# Patient Record
Sex: Female | Born: 1945 | Race: Black or African American | Hispanic: No | Smoking: Never smoker
Health system: Southern US, Community
[De-identification: ages and names within clinical notes are randomized; demographics above are authoritative.]

## PROBLEM LIST (undated history)

## (undated) DIAGNOSIS — Z87442 Personal history of urinary calculi: Secondary | ICD-10-CM

## (undated) DIAGNOSIS — Z9221 Personal history of antineoplastic chemotherapy: Secondary | ICD-10-CM

## (undated) DIAGNOSIS — I10 Essential (primary) hypertension: Secondary | ICD-10-CM

## (undated) DIAGNOSIS — Z923 Personal history of irradiation: Secondary | ICD-10-CM

## (undated) DIAGNOSIS — M1990 Unspecified osteoarthritis, unspecified site: Secondary | ICD-10-CM

## (undated) HISTORY — PX: MASTECTOMY: SHX3

## (undated) HISTORY — PX: CYSTOSCOPY: SUR368

## (undated) HISTORY — PX: BREAST BIOPSY: SHX20

---

## 2015-10-05 ENCOUNTER — Emergency Department (HOSPITAL_COMMUNITY)
Admission: EM | Admit: 2015-10-05 | Discharge: 2015-10-05 | Disposition: A | Discharge status: Home / Self Care | Payer: Medicare Other | Attending: Emergency Medicine | Admitting: Emergency Medicine

## 2015-10-05 ENCOUNTER — Emergency Department (HOSPITAL_COMMUNITY): Payer: Medicare Other

## 2015-10-05 ENCOUNTER — Encounter (HOSPITAL_COMMUNITY): Payer: Self-pay | Admitting: *Deleted

## 2015-10-05 DIAGNOSIS — I10 Essential (primary) hypertension: Secondary | ICD-10-CM | POA: Diagnosis not present

## 2015-10-05 DIAGNOSIS — Z79899 Other long term (current) drug therapy: Secondary | ICD-10-CM | POA: Insufficient documentation

## 2015-10-05 DIAGNOSIS — T464X5A Adverse effect of angiotensin-converting-enzyme inhibitors, initial encounter: Secondary | ICD-10-CM | POA: Diagnosis not present

## 2015-10-05 DIAGNOSIS — M170 Bilateral primary osteoarthritis of knee: Secondary | ICD-10-CM | POA: Diagnosis not present

## 2015-10-05 DIAGNOSIS — T783XXA Angioneurotic edema, initial encounter: Secondary | ICD-10-CM | POA: Diagnosis not present

## 2015-10-05 HISTORY — DX: Essential (primary) hypertension: I10

## 2015-10-05 MED ORDER — PREDNISONE 20 MG PO TABS
60.0000 mg | ORAL_TABLET | Freq: Once | ORAL | Status: AC
  Administered 2015-10-05: 60 mg via ORAL
  Filled 2015-10-05: qty 3

## 2015-10-05 MED ORDER — LOSARTAN POTASSIUM 25 MG PO TABS: 25.0000 mg | ORAL_TABLET | Freq: Every day | ORAL | Status: DC

## 2015-10-05 MED ORDER — PREDNISONE 20 MG PO TABS: 40.0000 mg | ORAL_TABLET | Freq: Every day | ORAL | Status: DC

## 2015-10-05 MED ORDER — DIPHENHYDRAMINE HCL 25 MG PO CAPS
50.0000 mg | ORAL_CAPSULE | Freq: Once | ORAL | Status: AC
  Administered 2015-10-05: 50 mg via ORAL
  Filled 2015-10-05: qty 2

## 2015-10-05 NOTE — ED Provider Notes (Signed)
CSN: [id number]     Arrival date & time 10/05/15  1132 History   First MD Initiated Contact with Patient 10/05/15 1327     Chief Complaint  Patient presents with  . Angioedema     (Consider location/radiation/quality/duration/timing/severity/associated sxs/prior Treatment) HPI   Donna [name] is a 70 y.o. female, with a history of hypertension, presenting to the ED with lip swelling that began last night. Pt states she was rinsing her mouth out with hydrogen peroxide, she dried her mouth, and then her lips began to swell. Patient also endorses that she has been taking benazepril for the last few months. Patient took 50 mg of Benadryl last night and another dose of 50 mg this morning, with no decrease in swelling. Patient denies shortness of breath, tongue swelling, hives, increase in lip swelling, or any other complaints. Pt is accompanied by her daughter. PCP is Chrisandra Netters, PA-C at the [facility].  Patient also states that she has had difficulty walking due to something going on with her knees. Patient states that her left knee bows outward and her right knee is swollen. These issues have been going on for a couple years, but they have never been evaluated. Patient's daughter states that the patient does not like doctors or hospitals and has refused to get her knees evaluated. Patient finally admits that she would like if she can get her knees x-rayed while she is here. Patient denies current pain or discomfort.  Past Medical History  Diagnosis Date  . Hypertension    History reviewed. No pertinent past surgical history. No family history on file. Social History  Substance Use Topics  . Smoking status: Never Smoker   . Smokeless tobacco: None  . Alcohol Use: No   OB History    No data available     Review of Systems  HENT: Positive for facial swelling. Negative for mouth sores.   Respiratory: Negative for shortness of breath.   Gastrointestinal: Negative for  nausea and vomiting.  Skin: Negative for rash.  Neurological: Negative for dizziness, speech difficulty and light-headedness.  All other systems reviewed and are negative.     Allergies  Benazepril  Home Medications   Prior to Admission medications   Medication Sig Start Date End Date Taking? Authorizing Provider  amLODipine (NORVASC) 10 MG tablet Take 10 mg by mouth daily. 07/29/15  Yes Historical Provider, MD  Aspirin-Salicylamide-Caffeine (BC HEADACHE POWDER PO) Take 1 packet by mouth daily as needed (pain).   Yes Historical Provider, MD  chlorthalidone (HYGROTON) 25 MG tablet Take 25 mg by mouth daily. 07/29/15  Yes Historical Provider, MD  Cholecalciferol (VITAMIN D PO) Take 1 tablet by mouth daily.   Yes Historical Provider, MD  losartan (COZAAR) 25 MG tablet Take 1 tablet (25 mg total) by mouth daily. 10/05/15   [name] C [name], PA-C  predniSONE (DELTASONE) 20 MG tablet Take 2 tablets (40 mg total) by mouth daily. 10/05/15   Toua Stites C Chardai Gangemi, PA-C   BP 121/81 mmHg  Pulse 89  Temp(Src) 98.7 F (37.1 C) (Oral)  Resp 16  Ht 5\' 5"  (1.651 m)  Wt 75.342 kg  BMI 27.64 kg/m2  SpO2 97% Physical Exam  Constitutional: She appears well-developed and well-nourished. No distress.  HENT:  Head: Normocephalic and atraumatic.  Angioedema noted to the upper and lower lips. No tongue swelling. No mouth lesions. Patient can open her mouth to at least 3 finger widths. Patient readily handles oral secretions without difficulty.  Eyes: Conjunctivae are normal. Pupils are equal, round, and reactive to light.  Neck: Neck supple.  No throat or neck swelling.  Cardiovascular: Normal rate, regular rhythm and intact distal pulses.   Pulmonary/Chest: Effort normal and breath sounds normal. No respiratory distress.  Abdominal: Soft. There is no tenderness. There is no guarding.  Musculoskeletal: She exhibits no edema or tenderness.  Some swelling noted to the right knee. No effusion, erythema, increased heat,  laxity, crepitus, or any other abnormalities. Left knee: Appears normal without any swelling, erythema, laxity, or other abnormalities. However, when the patient stands up it is evident that the knee bows laterally. Full range of motion intact in both knees.  Neurological: She is alert. She has normal reflexes.  No sensory deficits in the lower extremities. Strength 5 out of 5 bilaterally.  Skin: Skin is warm and dry. She is not diaphoretic.  No urticaria noted.  Psychiatric: She has a normal mood and affect. Her behavior is normal.  Nursing note and vitals reviewed.   ED Course  Procedures (including critical care time)  Imaging Review Dg Knee Complete 4 Views Left  10/05/2015  CLINICAL DATA:  70 year old with generalized bilateral knee pain. History of arthritis. No known injury. EXAM: LEFT KNEE - COMPLETE 4+ VIEW COMPARISON:  None. FINDINGS: The bones appear mildly demineralized. There are severe tricompartmental degenerative changes with joint space loss and prominent osteophytes. At least 1 loose body posteriorly is likely. There is no evidence of acute fracture, dislocation or significant joint effusion. Mild vascular calcifications are noted. IMPRESSION: Severe tricompartmental osteoarthritis.  No acute osseous findings. Electronically Signed   By: Richardean Sale M.D.   On: 10/05/2015 14:48   Dg Knee Complete 4 Views Right  10/05/2015  CLINICAL DATA:  70 year old with generalized bilateral knee pain. History of arthritis. No known injury. EXAM: RIGHT KNEE - COMPLETE 4+ VIEW COMPARISON:  None. FINDINGS: The bones appear demineralized. There is moderate to severe tricompartmental osteoarthritis (less than that demonstrated in the left knee) with joint space loss and osteophytes. No evidence of acute fracture, dislocation or significant joint effusion. No definite loose bodies are seen. There are mild vascular calcifications. IMPRESSION: Moderate to severe tricompartmental osteoarthritis. No  acute osseous findings. Electronically Signed   By: Richardean Sale M.D.   On: 10/05/2015 14:50   I have personally reviewed and evaluated these images as part of my medical decision-making.   EKG Interpretation None      MDM   Final diagnoses:  Angioedema, initial encounter  ACE inhibitor-aggravated angioedema, initial encounter  Primary osteoarthritis of both knees    Berta Minor presents with lip angioedema that began last night.  Findings and plan of care discussed with Carmin Muskrat, MD. Dr. Vanita Panda personally evaluated and examined this patient.   Suspect patient's benazepril may be the culprit for the angioedema. Patient and patient's daughter asked if I would mind contacting the patient's PCP and dialogue with her about strategies for replacing the patient's benazepril. 1:56 PM Spoke with Chrisandra Netters, PA-C, Patient's PCP [phone]). Ms. Darnell Level agrees that the patient should be removed from the ACE inhibitor. Further states that if the patient will be back in town within the week she can come to the office to be assessed and placed on a new blood pressure medication. If not, requested that the patient be placed on a low-dose ARB with follow-up in the office when she returns to town. Also states that she will call the patient and her daughter directly. Patient  states that she will be back in [place] next week, but this is subject to change. To avoid patient going without hypertensive medication coverage, patient prescribed a short course of losartan, to be started tomorrow. Patient was advised to watch out for signs of allergic reaction with this medication as well. Additionally, patient's x-rays show evidence of outer to severe osteoarthritis bilaterally. Patient was advised on techniques for further care of this issue. Patient will have to follow up with her PCP on this matter as well. Return precautions discussed. Patient and patient's daughter voiced understanding of all  of these instructions, agreed to the plan, and are comfortable with discharge. Patient specifically agrees to follow up with her PCP upon return to [place].   Filed Vitals:   10/05/15 1140 10/05/15 1444 10/05/15 1530  BP: 122/81 121/74 121/81  Pulse: 104 94 89  Temp: 98.7 F (37.1 C)    TempSrc: Oral    Resp: 18 18 16   Height: 5\' 5"  (1.651 m)    Weight: 75.342 kg    SpO2: 98% 99% 97%          Lorayne Bender, PA-C 10/05/15 [place], MD 10/07/15 1606

## 2015-10-05 NOTE — ED Notes (Signed)
Pt ate peanut butter crackers and ginger ale with no difficulty.

## 2015-10-05 NOTE — ED Notes (Signed)
Pt presents with swelling to upper and lower lips and is on benazpril.  Pt states the swelling started after rinsing mouth with hydrogen peroxide.

## 2015-10-05 NOTE — Discharge Instructions (Signed)
You have been seen today for lip swelling. Stop taking the benazepril immediately. Follow up with PCP as soon as possible next week. Call the office and Ms. Darnell Level will see you to reevaluate you and your blood pressure medications. In the mean time, you are being placed on a new blood pressure medication that is in a different drug class from the benazepril. If you are going to see your PCP within the next week, you may delay starting this medication until after she has evaluated you. If you do begin taking the Losartan, be sure to watch out for signs of allergic reaction or worsening swelling. If this occurs, stop taking the Losartan as well. Return to ED should symptoms worsen. Additionally, your x-ray showed moderate to severe arthritis in both knees. This is the likely the cause of your symptoms. Anti-inflammatory medications, such as ibuprofen or naproxen may help reduce inflammation and pain.

## 2016-05-24 ENCOUNTER — Other Ambulatory Visit (HOSPITAL_COMMUNITY): Payer: Self-pay | Admitting: *Deleted

## 2016-05-24 NOTE — Patient Instructions (Addendum)
Donna [name]  05/24/2016   Your procedure is scheduled on: 06-05-16  Report to [facility] Main  Entrance take [facility]  elevators to 3rd floor to  [place] at 705 AM.  Call this number if you have problems the morning of surgery [phone]   Remember: ONLY 1 PERSON MAY GO WITH YOU TO SHORT STAY TO GET  READY MORNING OF Donna [name].  Do not eat food or drink liquids :After Midnight.     Take these medicines the morning of surgery with A SIP OF WATER: AMLODIPINE (NORVASC)                               You may not have any metal on your body including hair pins and              piercings  Do not wear jewelry, make-up, lotions, powders or perfumes, deodorant             Do not wear nail polish.  Do not shave  48 hours prior to surgery.              Men may shave face and neck.   Do not bring valuables to the hospital. [place].  Contacts, dentures or bridgework may not be worn into surgery.  Leave suitcase in the car. After surgery it may be brought to your room.                  Please read over the following fact sheets you were given: _____________________________________________________________________             [facility] - Preparing for Surgery Before surgery, you can play an important role.  Because skin is not sterile, your skin needs to be as free of germs as possible.  You can reduce the number of germs on your skin by washing with CHG (chlorahexidine gluconate) soap before surgery.  CHG is an antiseptic cleaner which kills germs and bonds with the skin to continue killing germs even after washing. Please DO NOT use if you have an allergy to CHG or antibacterial soaps.  If your skin becomes reddened/irritated stop using the CHG and inform your nurse when you arrive at Short Stay. Do not shave (including legs and underarms) for at least 48 hours prior to the first CHG shower.  You may  shave your face/neck. Please follow these instructions carefully:  1.  Shower with CHG Soap the night before surgery and the  morning of Surgery.  2.  If you choose to wash your hair, wash your hair first as usual with your  normal  shampoo.  3.  After you shampoo, rinse your hair and body thoroughly to remove the  shampoo.                           4.  Use CHG as you would any other liquid soap.  You can apply chg directly  to the skin and wash                       Gently with a scrungie or clean washcloth.  5.  Apply the CHG Soap to  your body ONLY FROM THE NECK DOWN.   Do not use on face/ open                           Wound or open sores. Avoid contact with eyes, ears mouth and genitals (private parts).                       Wash face,  Genitals (private parts) with your normal soap.             6.  Wash thoroughly, paying special attention to the area where your surgery  will be performed.  7.  Thoroughly rinse your body with warm water from the neck down.  8.  DO NOT shower/wash with your normal soap after using and rinsing off  the CHG Soap.                9.  Pat yourself dry with a clean towel.            10.  Wear clean pajamas.            11.  Place clean sheets on your bed the night of your first shower and do not  sleep with pets. Day of Surgery : Do not apply any lotions/deodorants the morning of surgery.  Please wear clean clothes to the hospital/surgery center.  FAILURE TO FOLLOW THESE INSTRUCTIONS MAY RESULT IN THE CANCELLATION OF YOUR SURGERY PATIENT SIGNATURE_________________________________  NURSE SIGNATURE__________________________________  ________________________________________________________________________   Donna [name]  An incentive spirometer is a tool that can help keep your lungs clear and active. This tool measures how well you are filling your lungs with each breath. Taking long deep breaths may help reverse or decrease the chance of developing  breathing (pulmonary) problems (especially infection) following:  A long period of time when you are unable to move or be active. BEFORE THE PROCEDURE   If the spirometer includes an indicator to show your best effort, your nurse or respiratory therapist will set it to a desired goal.  If possible, sit up straight or lean slightly forward. Try not to slouch.  Hold the incentive spirometer in an upright position. INSTRUCTIONS FOR USE  1. Sit on the edge of your bed if possible, or sit up as far as you can in bed or on a chair. 2. Hold the incentive spirometer in an upright position. 3. Breathe out normally. 4. Place the mouthpiece in your mouth and seal your lips tightly around it. 5. Breathe in slowly and as deeply as possible, raising the piston or the ball toward the top of the column. 6. Hold your breath for 3-5 seconds or for as long as possible. Allow the piston or ball to fall to the bottom of the column. 7. Remove the mouthpiece from your mouth and breathe out normally. 8. Rest for a few seconds and repeat Steps 1 through 7 at least 10 times every 1-2 hours when you are awake. Take your time and take a few normal breaths between deep breaths. 9. The spirometer may include an indicator to show your best effort. Use the indicator as a goal to work toward during each repetition. 10. After each set of 10 deep breaths, practice coughing to be sure your lungs are clear. If you have an incision (the cut made at the time of surgery), support your incision when coughing by placing a pillow or rolled up towels firmly against  it. Once you are able to get out of bed, walk around indoors and cough well. You may stop using the incentive spirometer when instructed by your caregiver.  RISKS AND COMPLICATIONS  Take your time so you do not get dizzy or light-headed.  If you are in pain, you may need to take or ask for pain medication before doing incentive spirometry. It is harder to take a deep  breath if you are having pain. AFTER USE  Rest and breathe slowly and easily.  It can be helpful to keep track of a log of your progress. Your caregiver can provide you with a simple table to help with this. If you are using the spirometer at home, follow these instructions: [place] IF:   You are having difficultly using the spirometer.  You have trouble using the spirometer as often as instructed.  Your pain medication is not giving enough relief while using the spirometer.  You develop fever of 100.5 F (38.1 C) or higher. SEEK IMMEDIATE MEDICAL CARE IF:   You cough up bloody sputum that had not been present before.  You develop fever of 102 F (38.9 C) or greater.  You develop worsening pain at or near the incision site. MAKE SURE YOU:   Understand these instructions.  Will watch your condition.  Will get help right away if you are not doing well or get worse. Document Released: 10/08/2006 Document Revised: 08/20/2011 Document Reviewed: 12/09/2006 Exit[employer] Patient Information 2014 ExitCare, [place].   ________________________________________________________________________  WHAT IS A BLOOD TRANSFUSION? Blood Transfusion Information  A transfusion is the replacement of blood or some of its parts. Blood is made up of multiple cells which provide different functions.  Red blood cells carry oxygen and are used for blood loss replacement.  White blood cells fight against infection.  Platelets control bleeding.  Plasma helps clot blood.  Other blood products are available for specialized needs, such as hemophilia or other clotting disorders. BEFORE THE TRANSFUSION  Who gives blood for transfusions?   Healthy volunteers who are fully evaluated to make sure their blood is safe. This is blood bank blood. Transfusion therapy is the safest it has ever been in the practice of medicine. Before blood is taken from a donor, a complete history is taken to make sure  that person has no history of diseases nor engages in risky social behavior (examples are intravenous drug use or sexual activity with multiple partners). The donor's travel history is screened to minimize risk of transmitting infections, such as malaria. The donated blood is tested for signs of infectious diseases, such as HIV and hepatitis. The blood is then tested to be sure it is compatible with you in order to minimize the chance of a transfusion reaction. If you or a relative donates blood, this is often done in anticipation of surgery and is not appropriate for emergency situations. It takes many days to process the donated blood. RISKS AND COMPLICATIONS Although transfusion therapy is very safe and saves many lives, the main dangers of transfusion include:   Getting an infectious disease.  Developing a transfusion reaction. This is an allergic reaction to something in the blood you were given. Every precaution is taken to prevent this. The decision to have a blood transfusion has been considered carefully by your caregiver before blood is given. Blood is not given unless the benefits outweigh the risks. AFTER THE TRANSFUSION  Right after receiving a blood transfusion, you will usually feel much better and more  energetic. This is especially true if your red blood cells have gotten low (anemic). The transfusion raises the level of the red blood cells which carry oxygen, and this usually causes an energy increase.  The nurse administering the transfusion will monitor you carefully for complications. HOME CARE INSTRUCTIONS  No special instructions are needed after a transfusion. You may find your energy is better. Speak with your caregiver about any limitations on activity for underlying diseases you may have. SEEK MEDICAL CARE IF:   Your condition is not improving after your transfusion.  You develop redness or irritation at the intravenous (IV) site. SEEK IMMEDIATE MEDICAL CARE IF:  Any of  the following symptoms occur over the next 12 hours:  Shaking chills.  You have a temperature by mouth above 102 F (38.9 C), not controlled by medicine.  Chest, back, or muscle pain.  People around you feel you are not acting correctly or are confused.  Shortness of breath or difficulty breathing.  Dizziness and fainting.  You get a rash or develop hives.  You have a decrease in urine output.  Your urine turns a dark color or changes to pink, red, or brown. Any of the following symptoms occur over the next 10 days:  You have a temperature by mouth above 102 F (38.9 C), not controlled by medicine.  Shortness of breath.  Weakness after normal activity.  The white part of the eye turns yellow (jaundice).  You have a decrease in the amount of urine or are urinating less often.  Your urine turns a dark color or changes to pink, red, or brown. Document Released: 05/25/2000 Document Revised: 08/20/2011 Document Reviewed: 01/12/2008 [facility] Patient Information 2014 [place], [place].  _______________________________________________________________________

## 2016-05-25 ENCOUNTER — Encounter (HOSPITAL_COMMUNITY): Payer: Self-pay

## 2016-05-25 ENCOUNTER — Encounter (HOSPITAL_COMMUNITY)
Admission: RE | Admit: 2016-05-25 | Discharge: 2016-05-25 | Disposition: A | Discharge status: Home / Self Care | Payer: Medicare Other | Source: Ambulatory Visit | Attending: Orthopedic Surgery | Admitting: Orthopedic Surgery

## 2016-05-25 ENCOUNTER — Encounter (INDEPENDENT_AMBULATORY_CARE_PROVIDER_SITE_OTHER): Payer: Self-pay

## 2016-05-25 DIAGNOSIS — I10 Essential (primary) hypertension: Secondary | ICD-10-CM | POA: Diagnosis not present

## 2016-05-25 DIAGNOSIS — M1712 Unilateral primary osteoarthritis, left knee: Secondary | ICD-10-CM | POA: Diagnosis not present

## 2016-05-25 DIAGNOSIS — Z01812 Encounter for preprocedural laboratory examination: Secondary | ICD-10-CM | POA: Insufficient documentation

## 2016-05-25 DIAGNOSIS — R9431 Abnormal electrocardiogram [ECG] [EKG]: Secondary | ICD-10-CM | POA: Insufficient documentation

## 2016-05-25 DIAGNOSIS — Z01810 Encounter for preprocedural cardiovascular examination: Secondary | ICD-10-CM | POA: Insufficient documentation

## 2016-05-25 HISTORY — DX: Personal history of urinary calculi: Z87.442

## 2016-05-25 HISTORY — DX: Unspecified osteoarthritis, unspecified site: M19.90

## 2016-05-25 LAB — TYPE AND SCREEN
ABO/RH(D): O POS
ANTIBODY SCREEN: NEGATIVE

## 2016-05-25 LAB — BASIC METABOLIC PANEL
Anion gap: 10 (ref 5–15)
BUN: 17 mg/dL (ref 6–20)
CO2: 27 mmol/L (ref 22–32)
Calcium: 10.9 mg/dL — ABNORMAL HIGH (ref 8.9–10.3)
Chloride: 101 mmol/L (ref 101–111)
Creatinine, Ser: 0.79 mg/dL (ref 0.44–1.00)
GFR calc Af Amer: >60 mL/min (ref >60)
GFR calc non Af Amer: >60 mL/min (ref >60)
Glucose, Bld: 95 mg/dL (ref 65–99)
Potassium: 3.1 mmol/L — ABNORMAL LOW (ref 3.5–5.1)
Sodium: 138 mmol/L (ref 135–145)

## 2016-05-25 LAB — ABO/RH: ABO/RH(D): O POS

## 2016-05-25 LAB — SURGICAL PCR SCREEN
MRSA, PCR: INVALID — AB
Staphylococcus aureus: INVALID — AB

## 2016-05-25 LAB — CBC
HCT: 44 % (ref 36.0–46.0)
Hemoglobin: 15.3 g/dL — ABNORMAL HIGH (ref 12.0–15.0)
MCH: 29.1 pg (ref 26.0–34.0)
MCHC: 34.8 g/dL (ref 30.0–36.0)
MCV: 83.8 fL (ref 78.0–100.0)
PLATELETS: 297 10*3/uL (ref 150–400)
RBC: 5.25 MIL/uL — ABNORMAL HIGH (ref 3.87–5.11)
RDW: 13.4 % (ref 11.5–15.5)
WBC: 5.7 10*3/uL (ref 4.0–10.5)

## 2016-05-25 NOTE — Progress Notes (Signed)
bmet results faxed to dr Alvan Dame and left message with velvet mcbride  about bmet results

## 2016-05-27 NOTE — H&P (Signed)
TOTAL KNEE ADMISSION H&P  Patient is being admitted for left total knee arthroplasty.  Subjective:  Chief Complaint:     Left knee primary OA / pain  HPI: Donna [name], 70 y.o. female, has a history of pain and functional disability in the left knee due to arthritis and has failed non-surgical conservative treatments for greater than 12 weeks to include NSAID's and/or analgesics and activity modification.  Onset of symptoms was gradual, starting years ago with gradually worsening course since that time. The patient noted no past surgery on the left knee(s).  Patient currently rates pain in the left knee(s) at 6 out of 10 with activity. Patient has worsening of pain with activity and weight bearing, pain that interferes with activities of daily living, pain with passive range of motion, crepitus and joint swelling.  Patient has evidence of periarticular osteophytes and joint space narrowing by imaging studies.  There is no active infection.   Risks, benefits and expectations were discussed with the patient.  Risks including but not limited to the risk of anesthesia, blood clots, nerve damage, blood vessel damage, failure of the prosthesis, infection and up to and including death.  Patient understand the risks, benefits and expectations and wishes to proceed with surgery.   PCP: No PCP Per Patient  D/C Plans:      Home  Post-op Meds:       No Rx given   Tranexamic Acid:      To be given - IV  Decadron:      Is to be given  FYI:     ASA  Norco     Past Medical History:  Diagnosis Date  . Arthritis    oa  . History of kidney stones   . Hypertension     Past Surgical History:  Procedure Laterality Date  . CYSTOSCOPY  yrs ago    No prescriptions prior to admission.   Allergies  Allergen Reactions  . Benazepril Swelling    Patient had severe angioedema on 10/05/2015.    Social History  Substance Use Topics  . Smoking status: Never Smoker  . Smokeless tobacco: Never Used  .  Alcohol use No       Review of Systems  Constitutional: Negative.   HENT: Negative.   Eyes: Negative.   Respiratory: Negative.   Cardiovascular: Negative.   Gastrointestinal: Negative.   Genitourinary: Negative.   Musculoskeletal: Positive for joint pain.  Skin: Negative.   Neurological: Negative.   Endo/Heme/Allergies: Negative.   Psychiatric/Behavioral: Negative.     Objective:  Physical Exam  Constitutional: She is oriented to person, place, and time. She appears well-developed.  HENT:  Head: Normocephalic.  Eyes: Pupils are equal, round, and reactive to light.  Neck: Neck supple. No JVD present. No tracheal deviation present. No thyromegaly present.  Cardiovascular: Normal rate, regular rhythm, normal heart sounds and intact distal pulses.   Respiratory: Effort normal and breath sounds normal. No respiratory distress. She has no wheezes.  GI: Soft. There is no tenderness. There is no guarding.  Musculoskeletal:       Left knee: She exhibits decreased range of motion, swelling and bony tenderness. She exhibits no ecchymosis, no deformity, no laceration and no erythema. Tenderness found.  Lymphadenopathy:    She has no cervical adenopathy.  Neurological: She is alert and oriented to person, place, and time.  Skin: Skin is warm and dry.  Psychiatric: She has a normal mood and affect.      Labs:  Estimated body mass index is 26.92 kg/m as calculated from the following:   Height as of 05/25/16: 5\' 5"  (1.651 m).   Weight as of 05/25/16: 73.4 kg (161 lb 12.8 oz).   Imaging Review Plain radiographs demonstrate severe degenerative joint disease of the left knee(s). The overall alignment is significant varus. The bone quality appears to be good for age and reported activity level.  Assessment/Plan:  End stage arthritis, left knee   The patient history, physical examination, clinical judgment of the provider and imaging studies are consistent with end stage  degenerative joint disease of the left knee(s) and total knee arthroplasty is deemed medically necessary. The treatment options including medical management, injection therapy arthroscopy and arthroplasty were discussed at length. The risks and benefits of total knee arthroplasty were presented and reviewed. The risks due to aseptic loosening, infection, stiffness, patella tracking problems, thromboembolic complications and other imponderables were discussed. The patient acknowledged the explanation, agreed to proceed with the plan and consent was signed. Patient is being admitted for inpatient treatment for surgery, pain control, PT, OT, prophylactic antibiotics, VTE prophylaxis, progressive ambulation and ADL's and discharge planning. The patient is planning to be discharged home.       West Pugh Melquisedec Journey   PA-C  05/27/2016, 1:54 PM

## 2016-05-28 LAB — MRSA CULTURE: Culture: NOT DETECTED

## 2016-06-05 ENCOUNTER — Inpatient Hospital Stay (HOSPITAL_COMMUNITY): Payer: Medicare Other | Admitting: Anesthesiology

## 2016-06-05 ENCOUNTER — Inpatient Hospital Stay (HOSPITAL_COMMUNITY)
Admission: RE | Admit: 2016-06-05 | Discharge: 2016-06-06 | DRG: 470 | Disposition: A | Discharge status: Home / Self Care | Payer: Medicare Other | Source: Ambulatory Visit | Attending: Orthopedic Surgery | Admitting: Orthopedic Surgery

## 2016-06-05 ENCOUNTER — Encounter (HOSPITAL_COMMUNITY): Payer: Self-pay | Admitting: *Deleted

## 2016-06-05 ENCOUNTER — Encounter (HOSPITAL_COMMUNITY)
Admission: RE | Disposition: A | Discharge status: Home / Self Care | Payer: Self-pay | Source: Ambulatory Visit | Attending: Orthopedic Surgery

## 2016-06-05 DIAGNOSIS — I10 Essential (primary) hypertension: Secondary | ICD-10-CM | POA: Diagnosis present

## 2016-06-05 DIAGNOSIS — Z96652 Presence of left artificial knee joint: Secondary | ICD-10-CM

## 2016-06-05 DIAGNOSIS — E876 Hypokalemia: Secondary | ICD-10-CM | POA: Diagnosis present

## 2016-06-05 DIAGNOSIS — M1712 Unilateral primary osteoarthritis, left knee: Principal | ICD-10-CM | POA: Diagnosis present

## 2016-06-05 DIAGNOSIS — M25562 Pain in left knee: Secondary | ICD-10-CM | POA: Diagnosis present

## 2016-06-05 DIAGNOSIS — Z96659 Presence of unspecified artificial knee joint: Secondary | ICD-10-CM

## 2016-06-05 DIAGNOSIS — E663 Overweight: Secondary | ICD-10-CM | POA: Diagnosis present

## 2016-06-05 HISTORY — PX: TOTAL KNEE ARTHROPLASTY: SHX125

## 2016-06-05 SURGERY — ARTHROPLASTY, KNEE, TOTAL
Anesthesia: Monitor Anesthesia Care | Site: Knee | Laterality: Left

## 2016-06-05 MED ORDER — SODIUM CHLORIDE 0.9 % IR SOLN
Status: DC | PRN
  Administered 2016-06-05: 1000 mL

## 2016-06-05 MED ORDER — METHOCARBAMOL 500 MG PO TABS
500.0000 mg | ORAL_TABLET | Freq: Four times a day (QID) | ORAL | Status: DC | PRN
  Administered 2016-06-06 (×2): 500 mg via ORAL
  Filled 2016-06-05 (×2): qty 1

## 2016-06-05 MED ORDER — CEFAZOLIN SODIUM-DEXTROSE 2-4 GM/100ML-% IV SOLN
2.0000 g | INTRAVENOUS | Status: AC
  Administered 2016-06-05: 2 g via INTRAVENOUS
  Filled 2016-06-05: qty 100

## 2016-06-05 MED ORDER — BISACODYL 10 MG RE SUPP: 10.0000 mg | Freq: Every day | RECTAL | Status: DC | PRN

## 2016-06-05 MED ORDER — METOCLOPRAMIDE HCL 5 MG/ML IJ SOLN: 5.0000 mg | Freq: Three times a day (TID) | INTRAMUSCULAR | Status: DC | PRN

## 2016-06-05 MED ORDER — CEFAZOLIN SODIUM-DEXTROSE 2-4 GM/100ML-% IV SOLN
2.0000 g | Freq: Four times a day (QID) | INTRAVENOUS | Status: AC
  Administered 2016-06-05 (×2): 2 g via INTRAVENOUS
  Filled 2016-06-05 (×2): qty 100

## 2016-06-05 MED ORDER — MIDAZOLAM HCL 2 MG/2ML IJ SOLN
INTRAMUSCULAR | Status: AC
  Filled 2016-06-05: qty 2

## 2016-06-05 MED ORDER — BUPIVACAINE-EPINEPHRINE (PF) 0.25% -1:200000 IJ SOLN: INTRAMUSCULAR | Status: DC | PRN

## 2016-06-05 MED ORDER — ONDANSETRON HCL 4 MG/2ML IJ SOLN
INTRAMUSCULAR | Status: AC
  Filled 2016-06-05: qty 2

## 2016-06-05 MED ORDER — LOSARTAN POTASSIUM 50 MG PO TABS
50.0000 mg | ORAL_TABLET | Freq: Every day | ORAL | Status: DC
  Administered 2016-06-06: 50 mg via ORAL
  Filled 2016-06-05: qty 1

## 2016-06-05 MED ORDER — SODIUM CHLORIDE 0.9 % IV SOLN
INTRAVENOUS | Status: DC
  Administered 2016-06-05: 14:00:00 via INTRAVENOUS
  Filled 2016-06-05 (×4): qty 1000

## 2016-06-05 MED ORDER — ONDANSETRON HCL 4 MG/2ML IJ SOLN: 4.0000 mg | Freq: Four times a day (QID) | INTRAMUSCULAR | Status: DC | PRN

## 2016-06-05 MED ORDER — BUPIVACAINE HCL 0.25 % IJ SOLN
INTRAMUSCULAR | Status: DC | PRN
  Administered 2016-06-05: 30 mL

## 2016-06-05 MED ORDER — KETOROLAC TROMETHAMINE 30 MG/ML IJ SOLN
INTRAMUSCULAR | Status: AC
  Filled 2016-06-05: qty 1

## 2016-06-05 MED ORDER — OXYCODONE HCL 5 MG PO TABS: 5.0000 mg | ORAL_TABLET | Freq: Once | ORAL | Status: DC | PRN

## 2016-06-05 MED ORDER — SODIUM CHLORIDE 0.9 % IJ SOLN
INTRAMUSCULAR | Status: DC | PRN
  Administered 2016-06-05: 30 mL

## 2016-06-05 MED ORDER — PROPOFOL 10 MG/ML IV BOLUS
INTRAVENOUS | Status: AC
  Filled 2016-06-05: qty 60

## 2016-06-05 MED ORDER — ONDANSETRON HCL 4 MG/2ML IJ SOLN
INTRAMUSCULAR | Status: DC | PRN
  Administered 2016-06-05: 4 mg via INTRAVENOUS

## 2016-06-05 MED ORDER — FENTANYL CITRATE (PF) 100 MCG/2ML IJ SOLN
INTRAMUSCULAR | Status: AC
  Filled 2016-06-05: qty 2

## 2016-06-05 MED ORDER — FENTANYL CITRATE (PF) 100 MCG/2ML IJ SOLN
50.0000 ug | INTRAMUSCULAR | Status: AC | PRN
  Administered 2016-06-05 (×3): 50 ug via INTRAVENOUS

## 2016-06-05 MED ORDER — PHENYLEPHRINE HCL 10 MG/ML IJ SOLN
INTRAMUSCULAR | Status: AC
  Filled 2016-06-05: qty 1

## 2016-06-05 MED ORDER — METHOCARBAMOL 1000 MG/10ML IJ SOLN
500.0000 mg | Freq: Four times a day (QID) | INTRAVENOUS | Status: DC | PRN
  Administered 2016-06-05: 500 mg via INTRAVENOUS
  Filled 2016-06-05: qty 550
  Filled 2016-06-05: qty 5

## 2016-06-05 MED ORDER — AMLODIPINE BESYLATE 10 MG PO TABS
10.0000 mg | ORAL_TABLET | Freq: Every day | ORAL | Status: DC
  Administered 2016-06-06: 10 mg via ORAL
  Filled 2016-06-05: qty 1

## 2016-06-05 MED ORDER — FERROUS SULFATE 325 (65 FE) MG PO TABS
325.0000 mg | ORAL_TABLET | Freq: Three times a day (TID) | ORAL | Status: DC
  Administered 2016-06-05: 325 mg via ORAL
  Filled 2016-06-05: qty 1

## 2016-06-05 MED ORDER — 0.9 % SODIUM CHLORIDE (POUR BTL) OPTIME
TOPICAL | Status: DC | PRN
  Administered 2016-06-05: 1000 mL

## 2016-06-05 MED ORDER — METOCLOPRAMIDE HCL 5 MG PO TABS: 5.0000 mg | ORAL_TABLET | Freq: Three times a day (TID) | ORAL | Status: DC | PRN

## 2016-06-05 MED ORDER — OXYCODONE HCL 5 MG/5ML PO SOLN
5.0000 mg | Freq: Once | ORAL | Status: DC | PRN
  Filled 2016-06-05: qty 5

## 2016-06-05 MED ORDER — PHENOL 1.4 % MT LIQD: 1.0000 | OROMUCOSAL | Status: DC | PRN

## 2016-06-05 MED ORDER — ASPIRIN 81 MG PO CHEW
81.0000 mg | CHEWABLE_TABLET | Freq: Two times a day (BID) | ORAL | Status: DC
  Administered 2016-06-05 – 2016-06-06 (×2): 81 mg via ORAL
  Filled 2016-06-05 (×2): qty 1

## 2016-06-05 MED ORDER — PHENYLEPHRINE HCL 10 MG/ML IJ SOLN
INTRAVENOUS | Status: DC | PRN
  Administered 2016-06-05: 25 ug/min via INTRAVENOUS

## 2016-06-05 MED ORDER — ROPIVACAINE HCL 7.5 MG/ML IJ SOLN
INTRAMUSCULAR | Status: DC | PRN
  Administered 2016-06-05: 20 mL via PERINEURAL

## 2016-06-05 MED ORDER — FENTANYL CITRATE (PF) 100 MCG/2ML IJ SOLN: 25.0000 ug | INTRAMUSCULAR | Status: DC | PRN

## 2016-06-05 MED ORDER — MIDAZOLAM HCL 5 MG/ML IJ SOLN
1.0000 mg | INTRAMUSCULAR | Status: DC | PRN
  Administered 2016-06-05: 1 mg via INTRAVENOUS

## 2016-06-05 MED ORDER — ONDANSETRON HCL 4 MG PO TABS: 4.0000 mg | ORAL_TABLET | Freq: Four times a day (QID) | ORAL | Status: DC | PRN

## 2016-06-05 MED ORDER — STERILE WATER FOR IRRIGATION IR SOLN
Status: DC | PRN
  Administered 2016-06-05: 2000 mL

## 2016-06-05 MED ORDER — PROPOFOL 500 MG/50ML IV EMUL
INTRAVENOUS | Status: DC | PRN
  Administered 2016-06-05: 75 ug/kg/min via INTRAVENOUS

## 2016-06-05 MED ORDER — LACTATED RINGERS IV SOLN
INTRAVENOUS | Status: DC
  Administered 2016-06-05: 1000 mL via INTRAVENOUS
  Administered 2016-06-05 (×2): via INTRAVENOUS

## 2016-06-05 MED ORDER — POLYETHYLENE GLYCOL 3350 17 G PO PACK
17.0000 g | PACK | Freq: Two times a day (BID) | ORAL | Status: DC
  Administered 2016-06-05: 17 g via ORAL
  Filled 2016-06-05: qty 1

## 2016-06-05 MED ORDER — DOCUSATE SODIUM 100 MG PO CAPS
100.0000 mg | ORAL_CAPSULE | Freq: Two times a day (BID) | ORAL | Status: DC
  Administered 2016-06-05 – 2016-06-06 (×2): 100 mg via ORAL
  Filled 2016-06-05 (×2): qty 1

## 2016-06-05 MED ORDER — SODIUM CHLORIDE 0.9 % IJ SOLN
INTRAMUSCULAR | Status: AC
  Filled 2016-06-05: qty 50

## 2016-06-05 MED ORDER — DEXAMETHASONE SODIUM PHOSPHATE 10 MG/ML IJ SOLN
10.0000 mg | Freq: Once | INTRAMUSCULAR | Status: AC
  Administered 2016-06-06: 10 mg via INTRAVENOUS
  Filled 2016-06-05: qty 1

## 2016-06-05 MED ORDER — HYDROMORPHONE HCL 1 MG/ML IJ SOLN: 0.5000 mg | INTRAMUSCULAR | Status: DC | PRN

## 2016-06-05 MED ORDER — AMLODIPINE BESYLATE 10 MG PO TABS: 10.0000 mg | ORAL_TABLET | Freq: Every day | ORAL | Status: DC

## 2016-06-05 MED ORDER — DIPHENHYDRAMINE HCL 25 MG PO CAPS: 25.0000 mg | ORAL_CAPSULE | Freq: Four times a day (QID) | ORAL | Status: DC | PRN

## 2016-06-05 MED ORDER — MENTHOL 3 MG MT LOZG: 1.0000 | LOZENGE | OROMUCOSAL | Status: DC | PRN

## 2016-06-05 MED ORDER — CEFAZOLIN SODIUM-DEXTROSE 2-4 GM/100ML-% IV SOLN
INTRAVENOUS | Status: AC
  Filled 2016-06-05: qty 100

## 2016-06-05 MED ORDER — CELECOXIB 200 MG PO CAPS
200.0000 mg | ORAL_CAPSULE | Freq: Two times a day (BID) | ORAL | Status: DC
  Administered 2016-06-05 – 2016-06-06 (×3): 200 mg via ORAL
  Filled 2016-06-05 (×3): qty 1

## 2016-06-05 MED ORDER — BUPIVACAINE HCL (PF) 0.25 % IJ SOLN
INTRAMUSCULAR | Status: AC
  Filled 2016-06-05: qty 30

## 2016-06-05 MED ORDER — KETOROLAC TROMETHAMINE 30 MG/ML IJ SOLN
INTRAMUSCULAR | Status: DC | PRN
  Administered 2016-06-05: 30 mg

## 2016-06-05 MED ORDER — ROPIVACAINE HCL 7.5 MG/ML IJ SOLN
INTRAMUSCULAR | Status: AC
  Filled 2016-06-05: qty 20

## 2016-06-05 MED ORDER — MAGNESIUM CITRATE PO SOLN: 1.0000 | Freq: Once | ORAL | Status: DC | PRN

## 2016-06-05 MED ORDER — MIDAZOLAM HCL 5 MG/5ML IJ SOLN
INTRAMUSCULAR | Status: DC | PRN
  Administered 2016-06-05 (×2): 1 mg via INTRAVENOUS

## 2016-06-05 MED ORDER — CHLORTHALIDONE 25 MG PO TABS
25.0000 mg | ORAL_TABLET | Freq: Every day | ORAL | Status: DC
  Administered 2016-06-06: 25 mg via ORAL
  Filled 2016-06-05: qty 1

## 2016-06-05 MED ORDER — ALUM & MAG HYDROXIDE-SIMETH 200-200-20 MG/5ML PO SUSP: 30.0000 mL | ORAL | Status: DC | PRN

## 2016-06-05 MED ORDER — DEXAMETHASONE SODIUM PHOSPHATE 10 MG/ML IJ SOLN
10.0000 mg | Freq: Once | INTRAMUSCULAR | Status: AC
  Administered 2016-06-05: 10 mg via INTRAVENOUS

## 2016-06-05 MED ORDER — HYDROCODONE-ACETAMINOPHEN 7.5-325 MG PO TABS
1.0000 | ORAL_TABLET | ORAL | Status: DC
  Administered 2016-06-05: 1 via ORAL
  Administered 2016-06-05: 2 via ORAL
  Administered 2016-06-05 – 2016-06-06 (×2): 1 via ORAL
  Administered 2016-06-06: 2 via ORAL
  Administered 2016-06-06: 1 via ORAL
  Filled 2016-06-05: qty 2
  Filled 2016-06-05 (×2): qty 1
  Filled 2016-06-05: qty 2
  Filled 2016-06-05 (×2): qty 1

## 2016-06-05 MED ORDER — TRANEXAMIC ACID 1000 MG/10ML IV SOLN
1000.0000 mg | INTRAVENOUS | Status: AC
  Administered 2016-06-05: 1000 mg via INTRAVENOUS
  Filled 2016-06-05: qty 1100

## 2016-06-05 SURGICAL SUPPLY — 42 items
BAG ZIPLOCK 12X15 (MISCELLANEOUS) IMPLANT
BANDAGE ACE 6X5 VEL STRL LF (GAUZE/BANDAGES/DRESSINGS) ×2 IMPLANT
BLADE SAW SGTL 13.0X1.19X90.0M (BLADE) ×2 IMPLANT
BONE CEMENT GENTAMICIN (Cement) ×4 IMPLANT
BOWL SMART MIX CTS (DISPOSABLE) ×2 IMPLANT
CAPT KNEE TOTAL 3 ATTUNE ×2 IMPLANT
CEMENT BONE GENTAMICIN 40 (Cement) ×2 IMPLANT
CLOTH BEACON ORANGE TIMEOUT ST (SAFETY) ×2 IMPLANT
CUFF TOURN SGL QUICK 34 (TOURNIQUET CUFF) ×1
CUFF TRNQT CYL 34X4X40X1 (TOURNIQUET CUFF) ×1 IMPLANT
DECANTER SPIKE VIAL GLASS SM (MISCELLANEOUS) ×2 IMPLANT
DERMABOND ADVANCED (GAUZE/BANDAGES/DRESSINGS) ×1
DERMABOND ADVANCED .7 DNX12 (GAUZE/BANDAGES/DRESSINGS) ×1 IMPLANT
DRAPE U-SHAPE 47X51 STRL (DRAPES) ×2 IMPLANT
DRESSING AQUACEL AG SP 3.5X10 (GAUZE/BANDAGES/DRESSINGS) ×1 IMPLANT
DRSG AQUACEL AG SP 3.5X10 (GAUZE/BANDAGES/DRESSINGS) ×2
DURAPREP 26ML APPLICATOR (WOUND CARE) ×4 IMPLANT
ELECT REM PT RETURN 9FT ADLT (ELECTROSURGICAL) ×2
ELECTRODE REM PT RTRN 9FT ADLT (ELECTROSURGICAL) ×1 IMPLANT
GLOVE BIOGEL PI IND STRL 7.5 (GLOVE) ×1 IMPLANT
GLOVE BIOGEL PI IND STRL 8.5 (GLOVE) ×1 IMPLANT
GLOVE BIOGEL PI INDICATOR 7.5 (GLOVE) ×1
GLOVE BIOGEL PI INDICATOR 8.5 (GLOVE) ×1
GLOVE ECLIPSE 8.0 STRL XLNG CF (GLOVE) ×4 IMPLANT
GLOVE ORTHO TXT STRL SZ7.5 (GLOVE) ×4 IMPLANT
GOWN STRL REUS W/TWL LRG LVL3 (GOWN DISPOSABLE) ×2 IMPLANT
GOWN STRL REUS W/TWL XL LVL3 (GOWN DISPOSABLE) ×2 IMPLANT
HANDPIECE INTERPULSE COAX TIP (DISPOSABLE) ×1
MANIFOLD NEPTUNE II (INSTRUMENTS) ×2 IMPLANT
PACK TOTAL KNEE CUSTOM (KITS) ×2 IMPLANT
POSITIONER SURGICAL ARM (MISCELLANEOUS) ×2 IMPLANT
SET HNDPC FAN SPRY TIP SCT (DISPOSABLE) ×1 IMPLANT
SET PAD KNEE POSITIONER (MISCELLANEOUS) ×2 IMPLANT
SUT MNCRL AB 4-0 PS2 18 (SUTURE) ×2 IMPLANT
SUT VIC AB 1 CT1 36 (SUTURE) ×2 IMPLANT
SUT VIC AB 2-0 CT1 27 (SUTURE) ×3
SUT VIC AB 2-0 CT1 TAPERPNT 27 (SUTURE) ×3 IMPLANT
SUT VLOC 180 0 24IN GS25 (SUTURE) ×2 IMPLANT
SYR 50ML LL SCALE MARK (SYRINGE) ×2 IMPLANT
TRAY FOLEY W/METER SILVER 16FR (SET/KITS/TRAYS/PACK) ×2 IMPLANT
WRAP KNEE MAXI GEL POST OP (GAUZE/BANDAGES/DRESSINGS) ×2 IMPLANT
YANKAUER SUCT BULB TIP 10FT TU (MISCELLANEOUS) ×2 IMPLANT

## 2016-06-05 NOTE — Interval H&P Note (Signed)
History and Physical Interval Note:  06/05/2016 8:39 AM  Donna [name]  has presented today for surgery, with the diagnosis of left knee osteoarthritis  The various methods of treatment have been discussed with the patient and family. After consideration of risks, benefits and other options for treatment, the patient has consented to  Procedure(s): LEFT TOTAL KNEE ARTHROPLASTY (Left) as a surgical intervention .  The patient's history has been reviewed, patient examined, no change in status, stable for surgery.  I have reviewed the patient's chart and labs.  Questions were answered to the patient's satisfaction.     Mauri Pole

## 2016-06-05 NOTE — Discharge Instructions (Signed)

## 2016-06-05 NOTE — Evaluation (Signed)
Physical Therapy Evaluation Patient Details Name: Teaundra Drach MRN: [id number] DOB: April 27, 1946 Today's Date: 06/05/2016   History of Present Illness  L tka  Clinical Impression  The patient ambulated x 50'. Requires cueing for  Safety. Plans tp stay with daughter. Going for OPPT. Pt admitted with above diagnosis. Pt currently with functional limitations due to the deficits listed below (see PT Problem List).  Pt will benefit from skilled PT to increase their independence and safety with mobility to allow discharge to the venue listed below.       Follow Up Recommendations Outpatient PT    Equipment Recommendations  None recommended by PT    Recommendations for Other Services       Precautions / Restrictions Precautions Precautions: Fall;Knee      Mobility  Bed Mobility Overal bed mobility: Needs Assistance Bed Mobility: Supine to Sit     Supine to sit: Min assist     General bed mobility comments: cues for technique  Transfers Overall transfer level: Needs assistance Equipment used: Rolling walker (2 wheeled) Transfers: Sit to/from Stand Sit to Stand: Min assist         General transfer comment: steady assist to rise and stabilize.  Multimodal cues for hand position and  left leg position.  Ambulation/Gait Ambulation/Gait assistance: Min assist Ambulation Distance (Feet): 50 Feet Assistive device: Rolling walker (2 wheeled) Gait Pattern/deviations: Step-to pattern;Step-through pattern     General Gait Details: cues for sequence  Stairs            Wheelchair Mobility    Modified Rankin (Stroke Patients Only)       Balance                                             Pertinent Vitals/Pain Pain Assessment: No/denies pain    Home Living Family/patient expects to be discharged to:: Private residence Living Arrangements: Alone;Children Available Help at Discharge: Family;Available 24 hours/day Type of Home: House Home  Access: Stairs to enter   [employer] of Steps: 1 Home Layout: One level Home Equipment: Environmental consultant - 2 wheels Additional Comments: will stay with daughter    Prior Function Level of Independence: Independent               Hand Dominance        Extremity/Trunk Assessment   Upper Extremity Assessment Upper Extremity Assessment: Defer to OT evaluation    Lower Extremity Assessment Lower Extremity Assessment: LLE deficits/detail LLE Deficits / Details: + SLR, knee flexed to 70 degrees    Cervical / Trunk Assessment Cervical / Trunk Assessment: Normal  Communication   Communication: No difficulties  Cognition Arousal/Alertness: Awake/alert Behavior During Therapy: WFL for tasks assessed/performed Overall Cognitive Status: Difficult to assess Area of Impairment: Following commands       Following Commands: Follows one step commands inconsistently;Follows one step commands with increased time       General Comments: required repetition of commands and looked to daughter for some answers. gestures also     General Comments      Exercises     Assessment/Plan    PT Assessment Patient needs continued PT services  PT Problem List Decreased strength;Decreased range of motion;Decreased activity tolerance;Decreased mobility;Decreased knowledge of precautions;Decreased safety awareness;Decreased knowledge of use of DME;Decreased cognition          PT Treatment Interventions DME instruction;Gait training;Stair  training;Functional mobility training;Therapeutic activities;Therapeutic exercise;Patient/family education    PT Goals (Current goals can be found in the Care Plan section)  Acute Rehab PT Goals Patient Stated Goal: to walk PT Goal Formulation: With patient/family Time For Goal Achievement: 06/09/16 Potential to Achieve Goals: Good    Frequency 7X/week   Barriers to discharge        Co-evaluation               End of Session Equipment  Utilized During Treatment: Gait belt Activity Tolerance: Patient tolerated treatment well Patient left: in bed;with call bell/phone within reach;with family/visitor present Nurse Communication: Mobility status         Time: 1701-1720 PT Time Calculation (min) (ACUTE ONLY): 19 min   Charges:   PT Evaluation $PT Eval Low Complexity: 1 Procedure     PT G CodesClaretha Cooper 06/05/2016, 6:42 PM

## 2016-06-05 NOTE — Addendum Note (Signed)
Addendum  created 06/05/16 1431 by Lissa Morales, CRNA   Charge Capture section accepted

## 2016-06-05 NOTE — Anesthesia Procedure Notes (Signed)
Anesthesia Regional Block:  Adductor canal block  Pre-Anesthetic Checklist: ,, timeout performed, Correct Patient, Correct Site, Correct Laterality, Correct Procedure, Correct Position, site marked, Risks and benefits discussed,  Surgical consent,  Pre-op evaluation,  At surgeon's request and post-op pain management  Laterality: Left  Prep: chloraprep       Needles:  Injection technique: Single-shot  Needle Type: Echogenic Needle     Needle Length: 9cm 9 cm Needle Gauge: 21 and 21 G    Additional Needles:  Procedures: ultrasound guided (picture in chart) Adductor canal block Narrative:  Start time: 06/05/2016 9:04 AM End time: 06/05/2016 9:10 AM Injection made incrementally with aspirations every 5 mL.  Performed by: Personally  Anesthesiologist: Hiilei Gerst  Additional Notes: Pt tolerated the procedure well.

## 2016-06-05 NOTE — Op Note (Signed)
NAME:  Donna [name]                      MEDICAL RECORD NO.:  [id number]                             FACILITY:  [facility]      PHYSICIAN:  Pietro Cassis. Alvan Dame, M.D.  DATE OF BIRTH:  July 03, 1945      DATE OF PROCEDURE:  06/05/2016                                     OPERATIVE REPORT         PREOPERATIVE DIAGNOSIS:  Left knee osteoarthritis.      POSTOPERATIVE DIAGNOSIS:  Left knee osteoarthritis.      FINDINGS:  The patient was noted to have complete loss of cartilage and   bone-on-bone arthritis with associated osteophytes in the medial and patellofemoral compartments of   the knee with a significant synovitis and associated effusion.      PROCEDURE:  Left total knee replacement.      COMPONENTS USED:  DePuy Attune rotating platform posterior stabilized knee   system, a size 5N femur, 4 tibia, size 8 PS AOX insert, and 32 anatomic patellar   button.      SURGEON:  Pietro Cassis. Alvan Dame, M.D.      ASSISTANT:  Danae Orleans, PA-C.      ANESTHESIA:  Spinal.      SPECIMENS:  None.      COMPLICATION:  None.      DRAINS:  None.  EBL: <150cc      TOURNIQUET TIME:   Total Tourniquet Time Documented: Thigh (Left) - 34 minutes Total: Thigh (Left) - 34 minutes      The patient was stable to the recovery room.      INDICATION FOR PROCEDURE:  Donna [name] is a 70 y.o. female patient of   mine.  The patient had been seen, evaluated, and treated conservatively in the   office with medication, activity modification, and injections.  The patient had   radiographic changes of bone-on-bone arthritis with endplate sclerosis and osteophytes noted.      The patient failed conservative measures including medication, injections, and activity modification, and at this point was ready for more definitive measures.   Based on the radiographic changes and failed conservative measures, the patient   decided to proceed with total knee replacement.  Risks of infection,   DVT, component failure,  need for revision surgery, postop course, and   expectations were all   discussed and reviewed.  Consent was obtained for benefit of pain   relief.      PROCEDURE IN DETAIL:  The patient was brought to the operative theater.   Once adequate anesthesia, preoperative antibiotics, 2 gm of Ancef, 1 Tranexamic Acid, and 10 mg of Decadron administered, the patient was positioned supine with the left thigh tourniquet placed.  The left lower extremity was prepped and draped in sterile fashion.  A time-   out was performed identifying the patient, planned procedure, and   extremity.      The left lower extremity was placed in the [facility] leg holder.  The leg was   exsanguinated, tourniquet elevated to 250 mmHg.  A midline incision was   made followed by median parapatellar arthrotomy.  Following initial  exposure, attention was first directed to the patella.  Precut   measurement was noted to be 22 mm.  I resected down to 13 mm and used a   32 anatomic patellar button to restore patellar height as well as cover the cut   surface.      The lug holes were drilled and a metal shim was placed to protect the   patella from retractors and saw blades.      At this point, attention was now directed to the femur.  The femoral   canal was opened with a drill, irrigated to try to prevent fat emboli.  An   intramedullary rod was passed at 3 degrees valgus, 9 mm of bone was   resected off the distal femur.  Following this resection, the tibia was   subluxated anteriorly.  Using the extramedullary guide, 2 mm of bone was resected off   the proximal medial tibia.  We confirmed the gap would be   stable medially and laterally with a size 6 spacer block as well as confirmed   the cut was perpendicular in the coronal plane, checking with an alignment rod.      Once this was done, I sized the femur to be a size 5 in the anterior-   posterior dimension, chose a narrow component based on medial and   lateral  dimension.  The size 5 rotation block was then pinned in   position anterior referenced using the C-clamp to set rotation.  The   anterior, posterior, and  chamfer cuts were made without difficulty nor   notching making certain that I was along the anterior cortex to help   with flexion gap stability.      The final box cut was made off the lateral aspect of distal femur.      At this point, the tibia was sized to be a size 4, the size 4 tray was   then pinned in position through the medial third of the tubercle,   drilled, and keel punched.  Trial reduction was now carried with a 5 femur,  4 tibia, a size 8 PS insert, and the 32 anatomic patella botton.  The knee was brought to   extension, full extension with good flexion stability with the patella   tracking through the trochlea without application of pressure.  Given   all these findings the femoral lug holes and then the trial components removed.  Final components were   opened and cement was mixed.  The knee was irrigated with normal saline   solution and pulse lavage.  The synovial lining was   then injected with 30 cc of 0.25% Marcaine without epinephrine and 1 cc of Toradol plus 30 cc of NS for a total of 61 cc.      The knee was irrigated.  Final implants were then cemented onto clean and   dried cut surfaces of bone with the knee brought to extension with a size 8 PS trial insert.      Once the cement had fully cured, the excess cement was removed   throughout the knee.  I confirmed I was satisfied with the range of   motion and stability, and the final size 8 PS AOX insert was chosen.  It was   placed into the knee.      The tourniquet had been let down at 34 minutes.  No significant   hemostasis required.  The   extensor mechanism was  then reapproximated using #1 Vicryl and #0 V-lock sutures with the knee   in flexion.  The   remaining wound was closed with 2-0 Vicryl and running 4-0 Monocryl.   The knee was cleaned,  dried, dressed sterilely using Dermabond and   Aquacel dressing.  The patient was then   brought to recovery room in stable condition, tolerating the procedure   well.   Please note that Physician Assistant, Danae Orleans, PA-C, was present for the entirety of the case, and was utilized for pre-operative positioning, peri-operative retractor management, general facilitation of the procedure.  He was also utilized for primary wound closure at the end of the case.              Pietro Cassis Alvan Dame, M.D.    06/05/2016 11:22 AM

## 2016-06-05 NOTE — Anesthesia Preprocedure Evaluation (Signed)
Anesthesia Evaluation  Patient identified by MRN, date of birth, ID band Patient awake    Reviewed: Allergy & Precautions, H&P , NPO status , Patient's Chart, lab work & pertinent test results  Airway Mallampati: II   Neck ROM: full    Dental   Pulmonary neg pulmonary ROS,    breath sounds clear to auscultation       Cardiovascular hypertension,  Rhythm:regular Rate:Normal     Neuro/Psych    GI/Hepatic   Endo/Other    Renal/GU stones     Musculoskeletal  (+) Arthritis ,   Abdominal   Peds  Hematology   Anesthesia Other Findings   Reproductive/Obstetrics                             Anesthesia Physical Anesthesia Plan  ASA: II  Anesthesia Plan: Spinal and MAC   Post-op Pain Management:  Regional for Post-op pain   Induction:   Airway Management Planned: Simple Face Mask  Additional Equipment:   Intra-op Plan:   Post-operative Plan:   Informed Consent: I have reviewed the patients History and Physical, chart, labs and discussed the procedure including the risks, benefits and alternatives for the proposed anesthesia with the patient or authorized representative who has indicated his/her understanding and acceptance.     Plan Discussed with: CRNA, Anesthesiologist and Surgeon  Anesthesia Plan Comments:         Anesthesia Quick Evaluation

## 2016-06-05 NOTE — Transfer of Care (Signed)
Immediate Anesthesia Transfer of Care Note  Patient: Donna [name]  Procedure(s) Performed: Procedure(s): LEFT TOTAL KNEE ARTHROPLASTY (Left)  Patient Location: PACU  Anesthesia Type:Spinal  Level of Consciousness:  sedated, patient cooperative and responds to stimulation  Airway & Oxygen Therapy:Patient Spontanous Breathing and Patient connected to face mask oxgen  Post-op Assessment:  Report given to PACU RN and Post -op Vital signs reviewed and stable  Post vital signs:  Reviewed and stable  Last Vitals:  Vitals:   06/05/16 0939 06/05/16 1146  BP:    Pulse: 79 91  Resp: 13   Temp:      Complications: No apparent anesthesia complications

## 2016-06-05 NOTE — Anesthesia Postprocedure Evaluation (Signed)
Anesthesia Post Note  Patient: Donna [name]  Procedure(s) Performed: Procedure(s) (LRB): LEFT TOTAL KNEE ARTHROPLASTY (Left)  Patient location during evaluation: PACU Anesthesia Type: Spinal and MAC Level of consciousness: oriented and awake and alert Pain management: pain level controlled Vital Signs Assessment: post-procedure vital signs reviewed and stable Respiratory status: spontaneous breathing, respiratory function stable and patient connected to nasal cannula oxygen Cardiovascular status: blood pressure returned to baseline and stable Postop Assessment: no headache and no backache Anesthetic complications: no       Last Vitals:  Vitals:   06/05/16 1235 06/05/16 1250  BP: 134/74 121/66  Pulse: 80 75  Resp: 18 16  Temp:  36.7 C    Last Pain:  Vitals:   06/05/16 0719  TempSrc: Oral                 Tabrina Esty S

## 2016-06-05 NOTE — Progress Notes (Signed)
AssistedDr. Marcie Bal with left, ultrasound guided, adductor canal block. Side rails up, monitors on throughout procedure. See vital signs in flow sheet. Tolerated Procedure well.

## 2016-06-06 DIAGNOSIS — E876 Hypokalemia: Secondary | ICD-10-CM | POA: Diagnosis present

## 2016-06-06 DIAGNOSIS — E663 Overweight: Secondary | ICD-10-CM | POA: Diagnosis present

## 2016-06-06 LAB — CBC
HEMATOCRIT: 35 % — AB (ref 36.0–46.0)
HEMOGLOBIN: 12.1 g/dL (ref 12.0–15.0)
MCH: 29.1 pg (ref 26.0–34.0)
MCHC: 34.6 g/dL (ref 30.0–36.0)
MCV: 84.1 fL (ref 78.0–100.0)
Platelets: 279 10*3/uL (ref 150–400)
RBC: 4.16 MIL/uL (ref 3.87–5.11)
RDW: 13.5 % (ref 11.5–15.5)
WBC: 17 10*3/uL — AB (ref 4.0–10.5)

## 2016-06-06 LAB — BASIC METABOLIC PANEL WITH GFR
Anion gap: 8 (ref 5–15)
BUN: 19 mg/dL (ref 6–20)
CO2: 27 mmol/L (ref 22–32)
Calcium: 9.8 mg/dL (ref 8.9–10.3)
Chloride: 102 mmol/L (ref 101–111)
Creatinine, Ser: 0.82 mg/dL (ref 0.44–1.00)
GFR calc Af Amer: >60 mL/min
GFR calc non Af Amer: >60 mL/min
Glucose, Bld: 130 mg/dL — ABNORMAL HIGH (ref 65–99)
Potassium: 3.3 mmol/L — ABNORMAL LOW (ref 3.5–5.1)
Sodium: 137 mmol/L (ref 135–145)

## 2016-06-06 MED ORDER — METHOCARBAMOL 500 MG PO TABS: 500.0000 mg | ORAL_TABLET | Freq: Four times a day (QID) | ORAL | 0 refills | Status: DC | PRN

## 2016-06-06 MED ORDER — HYDROCODONE-ACETAMINOPHEN 7.5-325 MG PO TABS: 1.0000 | ORAL_TABLET | ORAL | 0 refills | Status: DC | PRN

## 2016-06-06 MED ORDER — POLYETHYLENE GLYCOL 3350 17 G PO PACK: 17.0000 g | PACK | Freq: Two times a day (BID) | ORAL | 0 refills | Status: DC

## 2016-06-06 MED ORDER — FERROUS SULFATE 325 (65 FE) MG PO TABS: 325.0000 mg | ORAL_TABLET | Freq: Three times a day (TID) | ORAL | 3 refills | Status: DC

## 2016-06-06 MED ORDER — CELECOXIB 200 MG PO CAPS: 200.0000 mg | ORAL_CAPSULE | Freq: Two times a day (BID) | ORAL | 0 refills | Status: DC

## 2016-06-06 MED ORDER — POTASSIUM CHLORIDE CRYS ER 20 MEQ PO TBCR
40.0000 meq | EXTENDED_RELEASE_TABLET | Freq: Two times a day (BID) | ORAL | Status: DC
  Administered 2016-06-06: 40 meq via ORAL
  Filled 2016-06-06: qty 2

## 2016-06-06 MED ORDER — DOCUSATE SODIUM 100 MG PO CAPS: 100.0000 mg | ORAL_CAPSULE | Freq: Two times a day (BID) | ORAL | 0 refills | Status: DC

## 2016-06-06 MED ORDER — ASPIRIN 81 MG PO CHEW: 81.0000 mg | CHEWABLE_TABLET | Freq: Two times a day (BID) | ORAL | 0 refills | Status: DC

## 2016-06-06 NOTE — Evaluation (Signed)
Occupational Therapy Evaluation Patient Details Name: Marieliz Granat MRN: [id number] DOB: 1945-07-03 Today's Date: 06/06/2016    History of Present Illness L tka   Clinical Impression   This 70 year old female was admitted for the above sx. All education was completed.  No further OT is needed at this time    Follow Up Recommendations  Supervision/Assistance - 24 hour;No OT follow up    Equipment Recommendations  None recommended by OT    Recommendations for Other Services       Precautions / Restrictions Precautions Precautions: Fall;Knee Restrictions Weight Bearing Restrictions: No Other Position/Activity Restrictions: WBAT      Mobility Bed Mobility               General bed mobility comments: oob  Transfers   Equipment used: Rolling walker (2 wheeled)   Sit to Stand: Min guard         General transfer comment: cues for UE/LE placement    Balance                                            ADL Overall ADL's : Needs assistance/impaired     Grooming: Set up;Sitting;Wash/dry hands;Wash/dry face   Upper Body Bathing: Set up;Sitting   Lower Body Bathing: Minimal assistance;Sit to/from stand   Upper Body Dressing : Minimal assistance;Sitting (due to IV)   Lower Body Dressing: Minimal assistance;Sit to/from stand   Toilet Transfer: Min guard;Ambulation;BSC;RW   Toileting- Water quality scientist and Hygiene: Min guard;Sit to/from stand         General ADL Comments: performed ADL and got off commode and walked around room to chair.  Pt will sponge bathe.  Encouraged daughter to let pt do as much as she can herself, but to stand near for safety.     Vision     Perception     Praxis      Pertinent Vitals/Pain Pain Assessment: 0-10 Pain Score: 3  Pain Location: L knee Pain Descriptors / Indicators: Aching Pain Intervention(s): Limited activity within patient's tolerance;Monitored during session;Premedicated  before session;Repositioned     Hand Dominance     Extremity/Trunk Assessment Upper Extremity Assessment Upper Extremity Assessment: Overall WFL for tasks assessed           Communication Communication Communication: No difficulties   Cognition Arousal/Alertness: Awake/alert Behavior During Therapy: WFL for tasks assessed/performed                   General Comments: needed multimodal cues at times for hand placement.  Pt is very independent   General Comments       Exercises       Shoulder Instructions      Home Living Family/patient expects to be discharged to:: Private residence Living Arrangements: Alone;Children Available Help at Discharge: Family;Available 24 hours/day               Bathroom Shower/Tub: Tub/shower unit Shower/tub characteristics: Architectural technologist: Standard     Home Equipment: Bedside commode   Additional Comments: will stay with daughter      Prior Functioning/Environment Level of Independence: Independent                 OT Problem List:     OT Treatment/Interventions:      OT Goals(Current goals can be found in the care plan section) Acute Rehab OT  Goals Patient Stated Goal: to walk OT Goal Formulation: All assessment and education complete, DC therapy  OT Frequency:     Barriers to D/C:            Co-evaluation              End of Session    Activity Tolerance: Patient tolerated treatment well Patient left: in chair;with call bell/phone within reach;with nursing/sitter in room;with family/visitor present   Time: [id number] OT Time Calculation (min): 23 min Charges:  OT General Charges $OT Visit: 1 Procedure OT Evaluation $OT Eval Low Complexity: 1 Procedure OT Treatments $Self Care/Home Management : 8-22 mins G-Codes:    Linette Gunderson Jun 24, 2016, 9:12 AM Lesle Chris, OTR/L [phone] Jun 24, 2016

## 2016-06-06 NOTE — Progress Notes (Addendum)
Physical Therapy Treatment Patient Details Name: Donna [name] MRN: [id number] DOB: 1946/05/09 Today's Date: 06/06/2016    History of Present Illness L tka    PT Comments    Requires cues for safety. Will practice step next visit  Follow Up Recommendations  Outpatient PT     Equipment Recommendations  None recommended by PT    Recommendations for Other Services       Precautions / Restrictions Precautions Precautions: Fall;Knee    Mobility  Bed Mobility Overal bed mobility: Needs Assistance Bed Mobility: Sit to Supine       Sit to supine: Supervision   General bed mobility comments: picks the leg up  Transfers Overall transfer level: Needs assistance Equipment used: Rolling walker (2 wheeled) Transfers: Sit to/from Stand Sit to Stand: Min guard         General transfer comment: cues for UE/LE placement, multimodal cues for safety, to not walk away from the RW  Ambulation/Gait- Ambulated x 150 ' with RW, cues for sequence and safety                 Stairs            Wheelchair Mobility    Modified Rankin (Stroke Patients Only)       Balance                                    Cognition Arousal/Alertness: Awake/alert Behavior During Therapy: WFL for tasks assessed/performed   Area of Impairment: Following commands       Following Commands: Follows one step commands inconsistently;Follows one step commands with increased time       General Comments: needed multimodal cues at times for hand placement.     Exercises Total Joint Exercises Ankle Circles/Pumps: AROM;10 reps;Both Quad Sets: AAROM;10 reps;Both Short Arc Quad: AROM;Left;10 reps Heel Slides: AAROM;Left;10 reps Hip ABduction/ADduction: AAROM;Left;10 reps Straight Leg Raises: AAROM;Left;10 reps Goniometric ROM: 5-80 left knee flexion    General Comments        Pertinent Vitals/Pain Pain Assessment: Faces Faces Pain Scale: Hurts a little  bit Pain Location: l knee Pain Descriptors / Indicators: Aching Pain Intervention(s): Monitored during session;Premedicated before session;Repositioned;Ice applied    Home Living                      Prior Function            PT Goals (current goals can now be found in the care plan section) Progress towards PT goals: Progressing toward goals    Frequency    7X/week      PT Plan Current plan remains appropriate    Co-evaluation             End of Session   Activity Tolerance: Patient tolerated treatment well Patient left: in bed;with call bell/phone within reach;with bed alarm set;with family/visitor present     Time: 1033-1100 PT Time Calculation (min) (ACUTE ONLY): 27 min  Charges:  $Gait Training: 8-22 mins $Therapeutic Exercise: 8-22 mins                    G Codes:      Claretha Cooper 06/06/2016, 3:44 PM

## 2016-06-06 NOTE — Progress Notes (Signed)
Physical Therapy Treatment Patient Details Name: Donna [name] MRN: [id number] DOB: 11/15/1945 Today's Date: 06/06/2016    History of Present Illness L tka    PT Comments    Patient is ready for Dc.   Follow Up Recommendations  Outpatient PT     Equipment Recommendations  None recommended by PT    Recommendations for Other Services       Precautions / Restrictions Precautions Precautions: Fall;Knee    Mobility  Bed Mobility Overal bed mobility: Needs Assistance Bed Mobility: Supine to Sit     Supine to sit: Modified independent (Device/Increase time) Sit to supine: Supervision   General bed mobility comments: picks the leg up  Transfers Overall transfer level: Needs assistance Equipment used: Rolling walker (2 wheeled) Transfers: Sit to/from Stand Sit to Stand: Supervision         General transfer comment: practiced x 3 for safety and  sequence, tends to forget the technique  Ambulation/Gait Ambulation/Gait assistance: Min guard Ambulation Distance (Feet): 50 Feet Assistive device: Rolling walker (2 wheeled) Gait Pattern/deviations: Step-to pattern;Step-through pattern;Antalgic     General Gait Details: cues for sequence   Stairs Stairs: Yes   Stair Management: Forwards;With walker Number of Stairs: 1 General stair comments: family present , practiced x 2 .   Wheelchair Mobility    Modified Rankin (Stroke Patients Only)       Balance                                    Cognition Arousal/Alertness: Awake/alert Behavior During Therapy: WFL for tasks assessed/performed   Area of Impairment: Following commands       Following Commands: Follows one step commands inconsistently;Follows one step commands with increased time       General Comments: needed multimodal cues at times for hand placement.     Exercises    General Comments        Pertinent Vitals/Pain Pain Assessment: Faces Faces Pain Scale: Hurts a  little bit Pain Location: l knee Pain Descriptors / Indicators: Aching Pain Intervention(s): Premedicated before session;Repositioned    Home Living                      Prior Function            PT Goals (current goals can now be found in the care plan section) Progress towards PT goals: Progressing toward goals    Frequency    7X/week      PT Plan Current plan remains appropriate    Co-evaluation             End of Session   Activity Tolerance: Patient tolerated treatment well Patient left: in chair;with call bell/phone within reach     Time: 1340-1405 PT Time Calculation (min) (ACUTE ONLY): 25 min  Charges:  $Gait Training: 8-22 mins $Therapeutic Exercise: 8-22 mins $Self Care/Home Management: 02-18-2023                    G Codes:      Claretha Cooper 06/06/2016, 3:48 PM

## 2016-06-06 NOTE — Progress Notes (Signed)
     Subjective: 1 Day Post-Op Procedure(s) (LRB): LEFT TOTAL KNEE ARTHROPLASTY (Left)   Patient reports pain as mild, pain controlled.  No events throughout the night.  Ready to work with PT.  Ready to be discharged home if she does well with PT.   Objective:   VITALS:   Vitals:   06/06/16 0237 06/06/16 0536  BP: 125/77 123/74  Pulse: 83 87  Resp: 20 18  Temp: 98 F (36.7 C) 97.3 F (36.3 C)    Dorsiflexion/Plantar flexion intact Incision: dressing C/D/I No cellulitis present Compartment soft  LABS  Recent Labs  06/06/16 0442  HGB 12.1  HCT 35.0*  WBC 17.0*  PLT 279     Recent Labs  06/06/16 0442  NA 137  K 3.3*  BUN 19  CREATININE 0.82  GLUCOSE 130*     Assessment/Plan: 1 Day Post-Op Procedure(s) (LRB): LEFT TOTAL KNEE ARTHROPLASTY (Left) Foley cath d/c'ed Advance diet Up with therapy D/C IV fluids Discharge home, if ready Follow [facility] 2 weeks [facility]. Follow up with OLIN,Arrian Manson D in 2 weeks.  Contact information:  [facility] [address], [place] [id number]    Overweight (BMI 25-29.9) Estimated body mass index is 26.92 kg/m as calculated from the following:   Height as of this encounter: 5\' 5"  (1.651 m).   Weight as of this encounter: 73.4 kg (161 lb 12 oz). Patient also counseled that weight may inhibit the healing process Patient counseled that losing weight will help with future health issues  Hypokalemia Treated with oral potassium and will observe States that she has been diagnosed with this in the past, usually eats at least a banana a day. Instructed to follow up with her PCP with regards to this issue.    West Pugh Charmane Protzman   PAC  06/06/2016, 8:39 AM

## 2016-06-06 NOTE — Care Management Note (Signed)
Case Management Note  Patient Details  Name: Vernon Ariel MRN: [id number] Date of Birth: Oct 25, 1945  Subjective/Objective:                  LEFT TOTAL KNEE ARTHROPLASTY (Left)  Action/Plan: Discharge planning Expected Discharge Date:  06/06/16               Expected Discharge Plan:  Home/Self Care  In-House Referral:     Discharge planning Services  CM Consult  Post Acute Care Choice:  NA Choice offered to:  Patient  DME Arranged:  N/A DME Agency:  NA  HH Arranged:  NA HH Agency:  NA  Status of Service:  Completed, signed off  If discussed at [place] of Stay Meetings, dates discussed:    Additional Comments: Cm met with pt in room to confirm plan is for outpt PT; pt confirms.  Pt states she has both rolling walker and 3n1 at home.  No other CM needs were communicated. Dellie Catholic, RN 06/06/2016, 1:48 PM

## 2016-06-07 NOTE — Discharge Summary (Signed)
Physician Discharge Summary  Patient ID: Donna [name] MRN: [id number] DOB/AGE: April 07, 1946 70 y.o.  Admit date: 06/05/2016 Discharge date: 06/06/2016   Procedures:  Procedure(s) (LRB): LEFT TOTAL KNEE ARTHROPLASTY (Left)  Attending Physician:  Dr. [name]   Admission Diagnoses:   Left knee primary OA / pain  Discharge Diagnoses:  Principal Problem:   S/P left TKA Active Problems:   Overweight (BMI 25.0-29.9)   Hypokalemia  Past Medical History:  Diagnosis Date  . Arthritis    oa  . History of kidney stones   . Hypertension     HPI:    Donna [name], 70 y.o. female, has a history of pain and functional disability in the left knee due to arthritis and has failed non-surgical conservative treatments for greater than 12 weeks to include NSAID's and/or analgesics and activity modification.  Onset of symptoms was gradual, starting years ago with gradually worsening course since that time. The patient noted no past surgery on the left knee(s).  Patient currently rates pain in the left knee(s) at 6 out of 10 with activity. Patient has worsening of pain with activity and weight bearing, pain that interferes with activities of daily living, pain with passive range of motion, crepitus and joint swelling.  Patient has evidence of periarticular osteophytes and joint space narrowing by imaging studies.  There is no active infection.   Risks, benefits and expectations were discussed with the patient.  Risks including but not limited to the risk of anesthesia, blood clots, nerve damage, blood vessel damage, failure of the prosthesis, infection and up to and including death.  Patient understand the risks, benefits and expectations and wishes to proceed with surgery.   PCP: No PCP Per Patient   Discharged Condition: good  Hospital Course:  Patient underwent the above stated procedure on 06/05/2016. Patient tolerated the procedure well and brought to the recovery room in good  condition and subsequently to the floor.  POD #1 BP: 123/74 ; Pulse: 87 ; Temp: 97.3 F (36.3 C) ; Resp: 18 Patient reports pain as mild, pain controlled.  No events throughout the night.  Ready to work with PT.  Ready to be discharged home. Dorsiflexion/plantar flexion intact, incision: dressing C/D/I, no cellulitis present and compartment soft.   LABS  Basename    HGB     12.1  HCT     35.0    Discharge Exam: General appearance: alert, cooperative and no distress Extremities: Homans sign is negative, no sign of DVT, no edema, redness or tenderness in the calves or thighs and no ulcers, gangrene or trophic changes  Disposition: Home with follow up in 2 weeks   Follow-up Information    Mauri Pole, MD. Schedule an appointment as soon as possible for a visit in 2 week(s).   Specialty:  Orthopedic Surgery Contact information: [address] [place] [address] [phone]           Discharge Instructions    Call MD / Call 911    Complete by:  As directed    If you experience chest pain or shortness of breath, CALL 911 and be transported to the hospital emergency room.  If you develope a fever above 101 F, pus (white drainage) or increased drainage or redness at the wound, or calf pain, call your surgeon's office.   Change dressing    Complete by:  As directed    Maintain surgical dressing until follow up in the clinic. If the edges start to  pull up, may reinforce with tape. If the dressing is no longer working, may remove and cover with gauze and tape, but must keep the area dry and clean.  Call with any questions or concerns.   Constipation Prevention    Complete by:  As directed    Drink plenty of fluids.  Prune juice may be helpful.  You may use a stool softener, such as Colace (over the counter) 100 mg twice a day.  Use MiraLax (over the counter) for constipation as needed.   Diet - low sodium heart healthy    Complete by:  As directed     Discharge instructions    Complete by:  As directed    Maintain surgical dressing until follow up in the clinic. If the edges start to pull up, may reinforce with tape. If the dressing is no longer working, may remove and cover with gauze and tape, but must keep the area dry and clean.  Follow up in 2 weeks at [facility]. Call with any questions or concerns.   Increase activity slowly as tolerated    Complete by:  As directed    Weight bearing as tolerated with assist device (walker, cane, etc) as directed, use it as long as suggested by your surgeon or therapist, typically at least 4-6 weeks.   TED hose    Complete by:  As directed    Use stockings (TED hose) for 2 weeks on both leg(s).  You may remove them at night for sleeping.      Allergies as of 06/06/2016      Reactions   Benazepril Swelling   Patient had severe angioedema on 10/05/2015.      Medication List    STOP taking these medications   acetaminophen 500 MG tablet Commonly known as:  TYLENOL   BC HEADACHE POWDER PO     TAKE these medications   amLODipine 10 MG tablet Commonly known as:  NORVASC Take 10 mg by mouth daily after lunch.   aspirin 81 MG chewable tablet Chew 1 tablet (81 mg total) by mouth 2 (two) times daily. Take for 4 weeks.   celecoxib 200 MG capsule Commonly known as:  CELEBREX Take 1 capsule (200 mg total) by mouth every 12 (twelve) hours. Take along with Pepcid to help avoid GI upset / ulcers.   chlorthalidone 25 MG tablet Commonly known as:  HYGROTON Take 25 mg by mouth daily after lunch.   docusate sodium 100 MG capsule Commonly known as:  COLACE Take 1 capsule (100 mg total) by mouth 2 (two) times daily.   ferrous sulfate 325 (65 FE) MG tablet Take 1 tablet (325 mg total) by mouth 3 (three) times daily after meals.   HYDROcodone-acetaminophen 7.5-325 MG tablet Commonly known as:  NORCO Take 1-2 tablets by mouth every 4 (four) hours as needed for moderate pain.     losartan 50 MG tablet Commonly known as:  COZAAR Take 50 mg by mouth daily after lunch. What changed:  Another medication with the same name was removed. Continue taking this medication, and follow the directions you see here.   methocarbamol 500 MG tablet Commonly known as:  ROBAXIN Take 1 tablet (500 mg total) by mouth every 6 (six) hours as needed for muscle spasms.   polyethylene glycol packet Commonly known as:  MIRALAX / GLYCOLAX Take 17 g by mouth 2 (two) times daily.   Vitamin D 2000 units Caps Take 2,000 Units by mouth daily after lunch.  Signed: West Pugh. Shatora Weatherbee   PA-C  06/07/2016, 12:57 PM

## 2020-01-28 ENCOUNTER — Telehealth: Payer: Self-pay | Admitting: Oncology

## 2020-01-28 NOTE — Telephone Encounter (Signed)
Received a call from [place] at [facility] regarding a referral for a dx of breast cancer. Ms. Lambing has been scheduled to see Dr. Jana Hakim on 9/15 at 4pm a/labs at 330pm. I offered an earlier date and time with another provider, per Jackelyn Poling pt declinied and wanted to see Dr. Jana Hakim. Jackelyn Poling has provided the appt date and time to the pt. I requested that the pt's pathology report be sent prior to the pt's appt. Jackelyn Poling will have the info sent.

## 2020-01-30 ENCOUNTER — Other Ambulatory Visit: Payer: Self-pay | Admitting: Oncology

## 2020-02-04 ENCOUNTER — Inpatient Hospital Stay: Payer: Medicare PPO | Attending: Oncology

## 2020-02-04 ENCOUNTER — Other Ambulatory Visit: Payer: Self-pay | Admitting: Oncology

## 2020-02-04 ENCOUNTER — Other Ambulatory Visit: Payer: Self-pay

## 2020-02-04 ENCOUNTER — Inpatient Hospital Stay (HOSPITAL_BASED_OUTPATIENT_CLINIC_OR_DEPARTMENT_OTHER): Payer: Medicare PPO | Admitting: Oncology

## 2020-02-04 VITALS — BP 162/84 | HR 79 | Temp 97.6°F | Resp 18 | Ht 63.5 in | Wt 157.1 lb

## 2020-02-04 DIAGNOSIS — C50212 Malignant neoplasm of upper-inner quadrant of left female breast: Secondary | ICD-10-CM | POA: Diagnosis not present

## 2020-02-04 DIAGNOSIS — I10 Essential (primary) hypertension: Secondary | ICD-10-CM | POA: Diagnosis not present

## 2020-02-04 DIAGNOSIS — Z171 Estrogen receptor negative status [ER-]: Secondary | ICD-10-CM | POA: Insufficient documentation

## 2020-02-04 DIAGNOSIS — Z79899 Other long term (current) drug therapy: Secondary | ICD-10-CM | POA: Insufficient documentation

## 2020-02-04 DIAGNOSIS — Z7982 Long term (current) use of aspirin: Secondary | ICD-10-CM

## 2020-02-04 DIAGNOSIS — C50919 Malignant neoplasm of unspecified site of unspecified female breast: Secondary | ICD-10-CM | POA: Insufficient documentation

## 2020-02-04 MED ORDER — PROCHLORPERAZINE MALEATE 10 MG PO TABS: 10.0000 mg | ORAL_TABLET | Freq: Four times a day (QID) | ORAL | 1 refills | Status: DC | PRN

## 2020-02-04 MED ORDER — LORATADINE 10 MG PO TABS: 10.0000 mg | ORAL_TABLET | Freq: Every day | ORAL | Status: DC

## 2020-02-04 MED ORDER — DEXAMETHASONE 4 MG PO TABS: ORAL_TABLET | ORAL | 1 refills | Status: DC

## 2020-02-04 MED ORDER — LIDOCAINE-PRILOCAINE 2.5-2.5 % EX CREA: TOPICAL_CREAM | CUTANEOUS | 3 refills | Status: DC

## 2020-02-04 NOTE — Progress Notes (Signed)
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  She brought this to medical attention and on 12/16/2019 underwent mammography, the results of which I do not have today.  She proceeded to left breast ultrasound showing breast density B.  There was a mass in the upper inner quadrant of the left breast at the 10 o'clock position 7 cm from the nipple.  It measured 3.26 cm.  There are no ultrasound comments regarding the axilla.  Biopsy of the mass in question was performed 01/15/2020 at [facility] and the pathology report describes a biphasic tumor with malignant epithelial component and a mesenchymal component.  The epithelial component appears to squamoid and stain strongly for CK 8/18 by immunohistochemistry.  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  The tumor was essentially triple negative, estrogen receptor 0%, progesterone receptor 1% with 1+ intensity, HER-2 1+ by immunohistochemistry, Ki-67 12%.  (The 1% progesterone receptor positivity is best read as an artifact as the progesterone receptor does not function in the absence of a functional estrogen receptor).  The patient's subsequent history is as detailed below.  INTERVAL HISTORY: Donna [name] was evaluated in the   breast cancer clinic on 12/05/2019 accompanied by her daughter Donna [name].   REVIEW OF SYSTEMS: The patient denies unusual headaches, visual changes, nausea, vomiting, stiff neck, dizziness, or gait imbalance. There has been no cough, phlegm production, or pleurisy, no chest pain or pressure, and no change in bowel or bladder habits. The patient denies fever, rash, bleeding, unexplained fatigue or unexplained weight loss.  [name] is completely independent in all activities, and exercises chiefly by gardening and doing housework.  So far she has been reluctant to receive a COVID-19 vaccine.  A detailed review of systems was otherwise entirely negative.   PAST MEDICAL HISTORY: Past Medical History:  Diagnosis Date  . Arthritis    oa  . History of kidney stones   . Hypertension     PAST SURGICAL HISTORY: Past Surgical History:  Procedure Laterality Date  . CYSTOSCOPY  yrs ago  . TOTAL KNEE ARTHROPLASTY Left 06/05/2016   Procedure: LEFT TOTAL KNEE ARTHROPLASTY;  Surgeon: Donna Cancel, MD;  Location: [facility];  Service: Orthopedics;  Laterality: Left;    FAMILY HISTORY No family history on file.  The patient has no information on her father.  Her mother died at the age of 58.  She had 1 sister and 2 brothers.  No one on that side of the family is known to have had cancer.  The patient herself had 5/2 siblings, 2 sisters and 3 brothers, none with a history of cancer.  GYNECOLOGIC HISTORY:  No LMP recorded. Patient is postmenopausal. Menarche: 74 years old Age at first live birth: 74 years old Donna [name] P 2 LMP mid 84s Contraceptive no HRT no  Hysterectomy?  No Salpingo-oophorectomy?  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  She is divorced and her former husband subsequently died.  She lives by herself with no pets.  Her son Donna [name] is a Production manager and travels a great deal as a Optometrist.  Daughter Donna [name] teaches middle school in  [place].  The patient has 4 grandchildren, no great-grandchildren.  She is a Psychologist, forensic.    ADVANCED DIRECTIVES: Not in place   HEALTH MAINTENANCE: Social History   Tobacco Use  . Smoking status: Never Smoker  . Smokeless tobacco: Never Used  Substance Use Topics  . Alcohol use: No  . Drug use: No     Colonoscopy: Never  PAP: Remote  Bone density: Remote   Allergies  Allergen Reactions  . Benazepril Swelling    Patient had severe angioedema on 10/05/2015.    Current Outpatient Medications  Medication Sig Dispense Refill  . amLODipine (NORVASC) 10 MG tablet Take 10 mg by mouth daily after lunch.     Marland Kitchen aspirin 81 MG chewable tablet Chew 1 tablet (81 mg total) by mouth 2 (two) times daily. Take for 4 weeks. 60 tablet 0  . chlorthalidone (HYGROTON) 25 MG tablet Take 25 mg by mouth daily after lunch.     . Cholecalciferol (VITAMIN D) 2000 units CAPS Take 2,000 Units by mouth daily after lunch.    . losartan (COZAAR) 50 MG tablet Take 50 mg by mouth daily after lunch.    . Multiple Vitamins-Minerals (CENTRUM ADULTS) TABS 1 tab(s)    . potassium chloride (MICRO-K) 10 MEQ CR capsule Take 10 mEq by mouth 2 (two) times daily.     No current facility-administered medications for this visit.    OBJECTIVE: African-American woman who appears well  Vitals:   02/04/20 1620  BP: (!) 162/84  Pulse: 79  Resp: 18  Temp: 97.6 F (36.4 C)  SpO2: 100%     Body mass index is 27.39 kg/m.   Wt Readings from Last 3 Encounters:  02/04/20 157 lb 1.6 oz (71.3 kg)  06/05/16 161 lb 12 oz (73.4 kg)  05/25/16 161 lb 12.8 oz (73.4 kg)      ECOG FS:1 - Symptomatic but completely ambulatory  Ocular: Sclerae unicteric, pupils round and equal Ear-nose-throat: Oropharynx clear and moist Lymphatic: No cervical or supraclavicular adenopathy Lungs no rales or rhonchi Heart regular rate and rhythm Abd soft, nontender, positive bowel sounds MSK no focal spinal tenderness, no joint  edema Neuro: non-focal, well-oriented, appropriate affect Breasts: The right breast is unremarkable.  In the left breast there is a very large mass which is visible on inspection and palpable both inferiorly and superiorly, pretty much extending across the breast.  There is no overlying erythema or nipple change.  I do not palpate a mass in the left axilla.  Left breast 02/04/2020    LAB RESULTS:  CMP     Component Value Date/Time   NA 137 06/06/2016 0442   K 3.3 (L) 06/06/2016 0442   CL 102 06/06/2016 0442   CO2 27 06/06/2016 0442   GLUCOSE 130 (H) 06/06/2016 0442   BUN 19 06/06/2016 0442   CREATININE 0.82 06/06/2016 0442   CALCIUM 9.8 06/06/2016 0442   GFRNONAA >60 06/06/2016 0442   GFRAA >60 06/06/2016 0442    No results found for: TOTALPROTELP, ALBUMINELP, A1GS, A2GS, BETS, BETA2SER, GAMS, MSPIKE, SPEI  No results found for: KPAFRELGTCHN, LAMBDASER, [facility]  Lab Results  Component Value Date  WBC 17.0 (H) 06/06/2016   HGB 12.1 06/06/2016   HCT 35.0 (L) 06/06/2016   MCV 84.1 06/06/2016   PLT 279 06/06/2016      Chemistry      Component Value Date/Time   NA 137 06/06/2016 0442   K 3.3 (L) 06/06/2016 0442   CL 102 06/06/2016 0442   CO2 27 06/06/2016 0442   BUN 19 06/06/2016 0442   CREATININE 0.82 06/06/2016 0442      Component Value Date/Time   CALCIUM 9.8 06/06/2016 0442       No results found for: LABCA2  No components found for: OEVOJJ009  No results for input(s): INR in the last 168 hours.  No results found for: LABCA2  No results found for: FGH829  No results found for: HBZ169  No results found for: CVE938  No results found for: CA2729  No components found for: HGQUANT  No results found for: CEA1 / No results found for: CEA1   No results found for: AFPTUMOR  No results found for: CHROMOGRNA  No results found for: PSA1  No visits with results within 3 Day(s) from this visit.  Latest known visit with results is:  Admission on  06/05/2016, Discharged on 06/06/2016  Component Date Value Ref Range Status  . WBC 06/06/2016 17.0* 4.0 - 10.5 K/uL Final  . RBC 06/06/2016 4.[name]  3.87 - 5.11 MIL/uL Final  . Hemoglobin 06/06/2016 12.1  12.0 - 15.0 g/dL Final  . HCT 06/06/2016 35.0* 36 - 46 % Final  . MCV 06/06/2016 84.1  78.0 - 100.0 fL Final  . MCH 06/06/2016 29.1  26.0 - 34.0 pg Final  . MCHC 06/06/2016 34.6  30.0 - 36.0 g/dL Final  . RDW 06/06/2016 13.5  11.5 - 15.5 % Final  . Platelets 06/06/2016 279  150 - 400 K/uL Final  . Sodium 06/06/2016 137  135 - 145 mmol/L Final  . Potassium 06/06/2016 3.3* 3.5 - 5.1 mmol/L Final  . Chloride 06/06/2016 102  101 - 111 mmol/L Final  . CO2 06/06/2016 27  22 - 32 mmol/L Final  . Glucose, Bld 06/06/2016 130* 65 - 99 mg/dL Final  . BUN 06/06/2016 19  6 - 20 mg/dL Final  . Creatinine, Ser 06/06/2016 0.82  0.44 - 1.00 mg/dL Final  . Calcium 06/06/2016 9.8  8.9 - 10.3 mg/dL Final  . GFR calc non Af Amer 06/06/2016 >60  >60 mL/min Final  . GFR calc Af Amer 06/06/2016 >60  >60 mL/min Final   Comment: (NOTE) The eGFR has been calculated using the CKD EPI equation. This calculation has not been validated in all clinical situations. eGFR's persistently <60 mL/min signify possible Chronic Kidney Disease.   . Anion gap 06/06/2016 8  5 - 15 Final    (this displays the last labs from the last 3 days)  No results found for: TOTALPROTELP, ALBUMINELP, A1GS, A2GS, BETS, BETA2SER, GAMS, MSPIKE, SPEI (this displays SPEP labs)  No results found for: KPAFRELGTCHN, LAMBDASER, KAPLAMBRATIO (kappa/lambda light chains)  No results found for: HGBA, HGBA2QUANT, HGBFQUANT, HGBSQUAN (Hemoglobinopathy evaluation)   No results found for: LDH  No results found for: IRON, TIBC, IRONPCTSAT (Iron and TIBC)  No results found for: FERRITIN  Urinalysis No results found for: COLORURINE, APPEARANCEUR, LABSPEC, PHURINE, GLUCOSEU, HGBUR, BILIRUBINUR, KETONESUR, PROTEINUR, UROBILINOGEN, NITRITE,  LEUKOCYTESUR   STUDIES: No results found.  ELIGIBLE FOR AVAILABLE RESEARCH PROTOCOL: no  ASSESSMENT: 74 y.o. Lastrup woman status post left breast upper inner quadrant biopsy 01/15/2020 for  a clinical T2 Nx, stage IIB anaplastic carcinoma, triple negative, with an MIB-1 of 12%  (1) consider neoadjuvant chemotherapy with doxorubicin and cyclophosphamide in dose dense fashion x4 followed by weekly paclitaxel and carboplatin x12  (2) definitive surgery to follow  (3) adjuvant radiation  PLAN: I spent approximately 60 minutes face to face with Donna [name] with more than 50% of that time spent in counseling and coordination of care. Specifically we reviewed the biology of the patient's diagnosis and the specifics of her situation.  We first reviewed the fact that cancer is not one disease but more than 100 different diseases and that it is important to keep them separate-- otherwise when friends and relatives discuss their own cancer experiences with Durenda confusion can result. Similarly we explained that if breast cancer spreads to the bone or liver, the patient would not have bone cancer or liver cancer, but breast cancer in the bone and breast cancer in the liver: one cancer in three places-- not 3 different cancers which otherwise would have to be treated in 3 different ways.  We discussed the difference between local and systemic therapy. In terms of loco-regional treatment, lumpectomy plus radiation is equivalent to mastectomy as far as survival is concerned.  Whether the patient should have a mastectomy or lumpectomy will depend on surgical evaluation and we will obtain a breast MRI to facilitate that.    We also noted that in terms of sequencing of treatments, whether systemic therapy or surgery is done first does not affect the ultimate outcome.  In Tunisha's case if the surgeon feels that shrinking the tumor preoperatively really might allow [place] to save the breast, we could  proceed to neoadjuvant treatment without compromising the outcome  We then discussed the rationale for systemic therapy. There is some risk that this cancer may have already spread to other parts of her body.  Given the nature of this cancer we are going to obtain a CT of the chest and a bone scan to document that we are not dealing with stage IV disease.  The patient understands that no radiology or blood test can rule out microscopic spread of the disease in other parts of the body and that is the reason she needs systemic treatment.  Next we went over the options for systemic therapy which are anti-estrogens, anti-HER-2 immunotherapy, and chemotherapy. Khamia does not meet criteria for anti-HER-2 immunotherapy or antiestrogens.  Unfortunately in triple negative breast cancer the only systemic treatment option is chemotherapy.  They understand that metaplastic breast cancer is an unusual subset and as a result we do not have well-defined treatment protocols specific to this type of cancer.  In my experience this is best treated as we treat triple negative's and that is the plan in this case.  We specifically discussed doxorubicin and cyclophosphamide in dose dense fashion x4 followed by weekly paclitaxel and carboplatin x12.  We discussed the possible toxicities side effects and complications and they will also come and meet with our chemotherapy teaching nurse once we have all the staging studies on hand so they can have further instruction on side effects.    At this point I am setting the patient up for echocardiography, bone scan, CT of the chest, and breast MRI.  I am asking one of our surgeons to evaluate her for either immediate surgery or more likely delayed surgery after neoadjuvant chemotherapy and in any case to place a port.  She will have an echocardiogram.  I am hoping  we can get all this accomplished in a relatively short period of time so that we can start her chemotherapy on September 13,  assuming her surgeon concurs with the neoadjuvant chemotherapy plan  I have urged the patient to get either the [place] or Moderna COVID-19 vaccine at her earliest possible opportunity.  Jonita has a good understanding of the overall plan. She agrees with it. She knows the goal of treatment in her case is cure. She will call with any problems that may develop before her next visit here.  Total encounter time 70 minutes.Chauncey Cruel, MD   02/04/2020 5:55 PM Medical Oncology and Hematology [facility] [address] [place], [place] [address] Tel. [phone]    Fax. [phone]

## 2020-02-04 NOTE — Progress Notes (Signed)

## 2020-02-04 NOTE — Progress Notes (Unsigned)
I received material on this patient and reviewed her biopsy.  She has an upper inner quadrant left breast mass measuring 3.3 cm, which is triple negative.  The report does not mention the axilla.  I tried to contact the patient to see if she was willing to get a CT of the chest and also see a surgeon even before she sees me on 02/24/2020 but so far have not been able to reach her.      Surgical pathology exam (01/15/2020 10:44 AM EDT) Surgical pathology exam (01/15/2020 10:44 AM EDT)  Component Value Ref Range Performed At Pathologist Signature  Addendum Immunohistochemical quantitative breast panel is performed on a representative sample of the tumor and scored by the pathologist using computer aided-imaging analysis, with the following results:  Estrogen receptor: NEGATIVE (Tumor stained: 0%; Intensity: 0+; Allred score: 0) Progesterone receptor: POSITIVE (Tumor stained: 1%; Intensity: 1+; Allred score: 3) HER2 breast: NEGATIVE (Score: 1+) Ki67: BORDERLINE (Tumor stained 12%)  Please see the full report from [facility] which has been scanned into the patient's electronic medical record for additional details.  Tissue Fixative: Specimens for breast or gastroesophageal prognostic staining should be submitted following ASCO/CAP guidelines: Incisional and excisional biopsy samples should have a cold ischemia time of no longer than 1 hour and be fixed in 10% neutral buffered formalin for intervals ranging from at least 6 hours to no more than 72 hours when testing for ER/PR and HER2; cytology specimens must be fixed in formalin. The given information for the specimen received meets the ASCO/CAP guidelines.  The Technical Component Processing and Analysis of this test was completed at [facility], [address] [place], [place] / [id number] / [phone] / CLIA # [id number]. / Medical Director(s): Mosie Lukes, M.D.. The Professional Component of this test was completed by Dr.  Jabier Mutton using [facility] image analysis software at [facility], [address], [place], [place] [address] / Phone: [phone] / Fax: [phone].  CPT: 02725D6  [facility] Addendum electronically signed by Brunetta Genera, MD on 01/22/2020 at 12:35 PM  Final Diagnosis Breast, left, 10 o'clock position, ultrasound-guided core biopsy: Malignant, favor metaplastic carcinoma (see comment)  This electronic signature is attestation that the pathologist personally reviewed the submitted material(s) and the final diagnosis reflects that evaluation.  [facility] Electronically signed by Brunetta Genera, MD on 01/19/2020 at 12:21 PM  Comment Sections show a biphasic tumor with a malignant epithelial component and a heterologous malignant mesenchymal component. The epithelial component has a squamoid appearance on routine stains and shows strong expression for CK 8/18 by IHC. The mesenchymal component has a myxochrondroid appearance. Metaplastic carcinoma (carcinosarcoma) is favored. Given the morphologic heterogeneity usually found in these tumors, final classification is deferred until the completely excised tumor can be fully examined.  IHC for estrogen receptor, progesterone receptor, HER-2/neu, Ki-67 will be performed, and the results will be reported in an addendum.  Dr. Lamont Snowball has reviewed slides and agrees with the diagnosis.  [facility]   Clinical History 74 year old female. Left breast lesion at the 10 o'clock position.   [facility]   Gross Description  The specimen is labeled with patient name and number Formalin: yes Site: Left breast 10:00-7 cm from nipple Description: Multiple yellow/tan core fragments Calcification designation: None Block(s): 1  Total fixation time: 58 hours  [facility]   Microscopic Description Microscopic examination substantiates the above diagnosis. Sections show malignant cells with significant nuclear  pleomorphism set within a myxochondroid matrix.  No conventional ductal carcinoma is identified. IHC is performed. CK 8/18 highlights infiltrating epithelial nests. p63 shows weak patchy nuclear staining of the epithelial cells. IHC control tissue stains appropriately.  [id number], [id number], [phone]

## 2020-02-05 ENCOUNTER — Encounter: Payer: Self-pay | Admitting: *Deleted

## 2020-02-05 ENCOUNTER — Telehealth: Payer: Self-pay | Admitting: *Deleted

## 2020-02-05 ENCOUNTER — Telehealth: Payer: Self-pay | Admitting: Oncology

## 2020-02-05 NOTE — Telephone Encounter (Signed)
Called pt to provide navigation resources and contact information.  Discussed next steps and confirmed appts. Denies further needs or questions at this time.

## 2020-02-05 NOTE — Telephone Encounter (Signed)
Scheduled appts per 8/26 los. Pt's daughter confirmed appt date and time.

## 2020-02-08 ENCOUNTER — Other Ambulatory Visit: Payer: Self-pay

## 2020-02-08 ENCOUNTER — Encounter: Payer: Self-pay | Admitting: *Deleted

## 2020-02-08 ENCOUNTER — Inpatient Hospital Stay: Payer: Medicare PPO

## 2020-02-08 ENCOUNTER — Telehealth: Payer: Self-pay | Admitting: *Deleted

## 2020-02-08 ENCOUNTER — Other Ambulatory Visit: Payer: Self-pay | Admitting: *Deleted

## 2020-02-08 ENCOUNTER — Encounter: Payer: Self-pay | Admitting: Hematology and Oncology

## 2020-02-08 ENCOUNTER — Ambulatory Visit: Payer: Medicare Other | Admitting: Hematology and Oncology

## 2020-02-08 DIAGNOSIS — Z171 Estrogen receptor negative status [ER-]: Secondary | ICD-10-CM

## 2020-02-08 DIAGNOSIS — C50919 Malignant neoplasm of unspecified site of unspecified female breast: Secondary | ICD-10-CM

## 2020-02-08 MED ORDER — LIDOCAINE-PRILOCAINE 2.5-2.5 % EX CREA: TOPICAL_CREAM | CUTANEOUS | 3 refills | Status: DC

## 2020-02-08 MED ORDER — DEXAMETHASONE 4 MG PO TABS: ORAL_TABLET | ORAL | 1 refills | Status: DC

## 2020-02-08 MED ORDER — PROCHLORPERAZINE MALEATE 10 MG PO TABS: 10.0000 mg | ORAL_TABLET | Freq: Four times a day (QID) | ORAL | 1 refills | Status: DC | PRN

## 2020-02-08 NOTE — Progress Notes (Signed)
Met with patient and daughter at registration to introduce myself as Arboriculturist and to offer available resources.  Discussed one-time $1000 Radio broadcast assistant to assist with personal expenses while going through treatment.  Advised what is needed to apply and gave my card for any additional financial questions or concerns.

## 2020-02-08 NOTE — Telephone Encounter (Signed)
Left message for a return phone for appointment confirmation call regarding echo appt. Left message that appointment was made for 9/3 at 10am.   Patient's daughter called back and confirmed patient's appointment.

## 2020-02-09 ENCOUNTER — Other Ambulatory Visit: Payer: Self-pay | Admitting: Oncology

## 2020-02-10 ENCOUNTER — Ambulatory Visit
Admission: RE | Admit: 2020-02-10 | Discharge: 2020-02-10 | Disposition: A | Discharge status: Home / Self Care | Payer: Medicare PPO | Source: Ambulatory Visit | Attending: Oncology | Admitting: Oncology

## 2020-02-10 ENCOUNTER — Other Ambulatory Visit (HOSPITAL_COMMUNITY): Payer: Self-pay | Admitting: General Surgery

## 2020-02-10 DIAGNOSIS — C50919 Malignant neoplasm of unspecified site of unspecified female breast: Secondary | ICD-10-CM

## 2020-02-10 DIAGNOSIS — C50312 Malignant neoplasm of lower-inner quadrant of left female breast: Secondary | ICD-10-CM

## 2020-02-10 DIAGNOSIS — Z171 Estrogen receptor negative status [ER-]: Secondary | ICD-10-CM

## 2020-02-10 MED ORDER — GADOBUTROL 1 MMOL/ML IV SOLN
7.0000 mL | Freq: Once | INTRAVENOUS | Status: AC | PRN
  Administered 2020-02-10: 7 mL via INTRAVENOUS

## 2020-02-11 ENCOUNTER — Encounter: Payer: Self-pay | Admitting: *Deleted

## 2020-02-11 ENCOUNTER — Other Ambulatory Visit: Payer: Self-pay

## 2020-02-11 DIAGNOSIS — C50919 Malignant neoplasm of unspecified site of unspecified female breast: Secondary | ICD-10-CM

## 2020-02-12 ENCOUNTER — Other Ambulatory Visit: Payer: Self-pay

## 2020-02-12 ENCOUNTER — Ambulatory Visit (HOSPITAL_BASED_OUTPATIENT_CLINIC_OR_DEPARTMENT_OTHER)
Admission: RE | Admit: 2020-02-12 | Discharge: 2020-02-12 | Disposition: A | Discharge status: Home / Self Care | Payer: Medicare PPO | Source: Ambulatory Visit | Attending: Oncology | Admitting: Oncology

## 2020-02-12 ENCOUNTER — Encounter (HOSPITAL_COMMUNITY): Payer: Self-pay

## 2020-02-12 ENCOUNTER — Ambulatory Visit (HOSPITAL_COMMUNITY)
Admission: RE | Admit: 2020-02-12 | Discharge: 2020-02-12 | Disposition: A | Discharge status: Home / Self Care | Payer: Medicare PPO | Source: Ambulatory Visit | Attending: Oncology | Admitting: Oncology

## 2020-02-12 ENCOUNTER — Other Ambulatory Visit: Payer: Self-pay | Admitting: Student

## 2020-02-12 DIAGNOSIS — C50919 Malignant neoplasm of unspecified site of unspecified female breast: Secondary | ICD-10-CM | POA: Insufficient documentation

## 2020-02-12 DIAGNOSIS — I10 Essential (primary) hypertension: Secondary | ICD-10-CM | POA: Diagnosis not present

## 2020-02-12 DIAGNOSIS — I2510 Atherosclerotic heart disease of native coronary artery without angina pectoris: Secondary | ICD-10-CM | POA: Insufficient documentation

## 2020-02-12 DIAGNOSIS — I700 Atherosclerosis of aorta: Secondary | ICD-10-CM | POA: Insufficient documentation

## 2020-02-12 DIAGNOSIS — Z0189 Encounter for other specified special examinations: Secondary | ICD-10-CM

## 2020-02-12 LAB — POCT I-STAT CREATININE: Creatinine, Ser: 1 mg/dL (ref 0.44–1.00)

## 2020-02-12 LAB — ECHOCARDIOGRAM COMPLETE
Area-P 1/2: 2.54 cm2
S' Lateral: 2.2 cm

## 2020-02-12 MED ORDER — TECHNETIUM TC 99M MEDRONATE IV KIT
21.1000 | PACK | Freq: Once | INTRAVENOUS | Status: AC | PRN
  Administered 2020-02-12: 21.1 via INTRAVENOUS

## 2020-02-12 MED ORDER — IOHEXOL 300 MG/ML  SOLN
75.0000 mL | Freq: Once | INTRAMUSCULAR | Status: AC | PRN
  Administered 2020-02-12: 75 mL via INTRAVENOUS

## 2020-02-12 NOTE — Progress Notes (Signed)
°  Echocardiogram 2D Echocardiogram has been performed.  Donna [name] 02/12/2020, 12:08 PM

## 2020-02-16 ENCOUNTER — Other Ambulatory Visit: Payer: Self-pay

## 2020-02-16 ENCOUNTER — Ambulatory Visit (HOSPITAL_COMMUNITY)
Admission: RE | Admit: 2020-02-16 | Discharge: 2020-02-16 | Disposition: A | Discharge status: Home / Self Care | Payer: Medicare PPO | Source: Ambulatory Visit | Attending: General Surgery | Admitting: General Surgery

## 2020-02-16 DIAGNOSIS — C50312 Malignant neoplasm of lower-inner quadrant of left female breast: Secondary | ICD-10-CM | POA: Diagnosis not present

## 2020-02-16 DIAGNOSIS — Z79899 Other long term (current) drug therapy: Secondary | ICD-10-CM | POA: Diagnosis not present

## 2020-02-16 DIAGNOSIS — Z171 Estrogen receptor negative status [ER-]: Secondary | ICD-10-CM | POA: Insufficient documentation

## 2020-02-16 DIAGNOSIS — I10 Essential (primary) hypertension: Secondary | ICD-10-CM | POA: Diagnosis not present

## 2020-02-16 HISTORY — PX: IR IMAGING GUIDED PORT INSERTION: IMG5740

## 2020-02-16 MED ORDER — MIDAZOLAM HCL 2 MG/2ML IJ SOLN
INTRAMUSCULAR | Status: AC
  Filled 2020-02-16: qty 2

## 2020-02-16 MED ORDER — FENTANYL CITRATE (PF) 100 MCG/2ML IJ SOLN
INTRAMUSCULAR | Status: AC | PRN
  Administered 2020-02-16: 50 ug via INTRAVENOUS

## 2020-02-16 MED ORDER — SODIUM CHLORIDE 0.9 % IV SOLN: INTRAVENOUS | Status: DC

## 2020-02-16 MED ORDER — LIDOCAINE HCL 1 % IJ SOLN
INTRAMUSCULAR | Status: AC
  Filled 2020-02-16: qty 20

## 2020-02-16 MED ORDER — CEFAZOLIN SODIUM-DEXTROSE 2-4 GM/100ML-% IV SOLN
INTRAVENOUS | Status: AC
  Filled 2020-02-16: qty 100

## 2020-02-16 MED ORDER — LIDOCAINE HCL 1 % IJ SOLN
INTRAMUSCULAR | Status: AC | PRN
  Administered 2020-02-16: 20 mL

## 2020-02-16 MED ORDER — CEFAZOLIN SODIUM-DEXTROSE 2-4 GM/100ML-% IV SOLN
2.0000 g | Freq: Once | INTRAVENOUS | Status: AC
  Administered 2020-02-16: 2 g via INTRAVENOUS

## 2020-02-16 MED ORDER — FENTANYL CITRATE (PF) 100 MCG/2ML IJ SOLN
INTRAMUSCULAR | Status: AC
  Filled 2020-02-16: qty 2

## 2020-02-16 MED ORDER — MIDAZOLAM HCL 2 MG/2ML IJ SOLN
INTRAMUSCULAR | Status: AC | PRN
  Administered 2020-02-16: 1 mg via INTRAVENOUS

## 2020-02-16 MED ORDER — HEPARIN SOD (PORK) LOCK FLUSH 100 UNIT/ML IV SOLN
INTRAVENOUS | Status: AC
  Filled 2020-02-16: qty 5

## 2020-02-16 NOTE — Procedures (Signed)
Interventional Radiology Procedure Note  Procedure: Placement of a right IJ approach single lumen PowerPort.  Tip is positioned at the superior cavoatrial junction and catheter is ready for immediate use.  Complications: None Recommendations:  - Ok to shower tomorrow - Do not submerge for 7 days - Routine line care   Signed,  Hettie Roselli S. Tadarrius Burch, DO   

## 2020-02-16 NOTE — H&P (Signed)
Chief Complaint: Patient was seen in consultation today for image-guided port-a-catheter placement.  Referring Physician(s): [name]  Supervising Physician: Corrie Mckusick  Patient Status: [facility] - Out-pt  History of Present Illness: Donna [name] is a 74 y.o. female with a medical history significant for arthritis and hypertension. Over the summer, she palpated a mass in her left breast while showering. 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   Interventional Radiology has been asked to evaluate this patient for an image-guided port-a-catheter insertion to facilitate her treatment plans.   Past Medical History:  Diagnosis Date  . Arthritis    oa  . History of kidney stones   . Hypertension     Past Surgical History:  Procedure Laterality Date  . CYSTOSCOPY  yrs ago  . TOTAL KNEE ARTHROPLASTY Left 06/05/2016   Procedure: LEFT TOTAL KNEE ARTHROPLASTY;  Surgeon: [name], MD;  Location: [facility] ORS;  Service: Orthopedics;  Laterality: Left;    Allergies: Benazepril  Medications: Prior to Admission medications   Medication Sig Start Date End Date Taking? Authorizing Provider  amLODipine (NORVASC) 10 MG tablet Take 10 mg by mouth daily after lunch.  07/29/15  Yes [provider]  aspirin 81 MG chewable tablet Chew 1 tablet (81 mg total) by mouth 2 (two) times daily. Take for 4 weeks. 06/06/16  Yes Babish, Rodman Key, PA-C  chlorthalidone (HYGROTON) 25 MG tablet Take 25 mg by mouth daily after lunch.  07/29/15  Yes [provider]  Cholecalciferol (VITAMIN D) 2000 units CAPS Take 2,000 Units by mouth daily after lunch.   Yes [provider]  losartan (COZAAR) 50 MG tablet Take 50 mg by mouth daily after lunch.   Yes [provider]  Multiple Vitamins-Minerals (CENTRUM ADULTS) TABS 1 tab(s)   Yes [provider]  potassium chloride (MICRO-K) 10 MEQ CR capsule Take 10  mEq by mouth 2 (two) times daily. 11/13/19  Yes [provider]  dexamethasone (DECADRON) 4 MG tablet Take 2 tablets by mouth daily starting the day after Carboplatin and Cytoxan x 3 days. Take with food. 02/08/20   Magrinat, [name], MD  lidocaine-prilocaine (EMLA) cream Apply to affected area once 02/08/20   Magrinat, [name], MD  prochlorperazine (COMPAZINE) 10 MG tablet Take 1 tablet (10 mg total) by mouth every 6 (six) hours as needed (Nausea or vomiting). 02/08/20   Magrinat, [name], MD     No family history on file.  Social History   Socioeconomic History  . Marital status: Divorced    Spouse name: Not on file  . Number of children: Not on file  . Years of education: Not on file  . Highest education level: Not on file  Occupational History  . Not on file  Tobacco Use  . Smoking status: Never Smoker  . Smokeless tobacco: Never Used  Substance and Sexual Activity  . Alcohol use: No  . Drug use: No  . Sexual activity: Not on file  Other Topics Concern  . Not on file  Social History Narrative  . Not on file   Social Determinants of Health   Financial Resource Strain:   . Difficulty of Paying Living Expenses: Not on file  Food Insecurity:   . Worried About Charity fundraiser in the Last Year: Not on file  . Ran Out of Food in the Last Year: Not on file  Transportation Needs:   . Lack of Transportation (  Medical): Not on file  . Lack of Transportation (Non-Medical): Not on file  Physical Activity:   . Days of Exercise per Week: Not on file  . Minutes of Exercise per Session: Not on file  Stress:   . Feeling of Stress : Not on file  Social Connections:   . Frequency of Communication with Friends and Family: Not on file  . Frequency of Social Gatherings with Friends and Family: Not on file  . Attends Religious Services: Not on file  . Active Member of Clubs or Organizations: Not on file  . Attends Archivist Meetings: Not on file  . Marital Status:  Not on file    Review of Systems: A 12 point ROS discussed and pertinent positives are indicated in the HPI above.  All other systems are negative.  Review of Systems  Constitutional: Negative for fatigue.  Respiratory: Negative for cough and shortness of breath.   Cardiovascular: Negative for chest pain and leg swelling.  Gastrointestinal: Negative for abdominal pain, diarrhea, nausea and vomiting.  Musculoskeletal: Negative for back pain.  Neurological: Negative for dizziness and headaches.    Vital Signs: BP 138/86   Pulse 78   Temp 97.9 F (36.6 C) (Oral)   Resp 16   Ht 5\' 3"  (1.6 m)   Wt 156 lb (70.8 kg)   SpO2 99%   BMI 27.63 kg/m   Physical Exam Constitutional:      General: She is not in acute distress. HENT:     Mouth/Throat:     Mouth: Mucous membranes are moist.     Pharynx: Oropharynx is clear.  Cardiovascular:     Rate and Rhythm: Normal rate and regular rhythm.     Pulses: Normal pulses.     Heart sounds: Normal heart sounds.  Pulmonary:     Effort: Pulmonary effort is normal.     Breath sounds: Normal breath sounds.  Musculoskeletal:        General: Normal range of motion.  Skin:    General: Skin is warm and dry.  Neurological:     Mental Status: She is alert and oriented to person, place, and time.     Imaging: CT Chest W Contrast  Result Date: 02/12/2020 CLINICAL DATA:  Breast cancer. EXAM: CT CHEST WITH CONTRAST TECHNIQUE: Multidetector CT imaging of the chest was performed during intravenous contrast administration. CONTRAST:  71mL OMNIPAQUE IOHEXOL 300 MG/ML  SOLN COMPARISON:  None. FINDINGS: Cardiovascular: Atherosclerotic calcification of the aorta and coronary arteries. Heart is mildly enlarged. No pericardial effusion. Mediastinum/Nodes: No pathologically enlarged mediastinal, hilar or internal mammary lymph nodes. Left axillary lymph nodes are not enlarged by CT size criteria, measuring up to 7 mm. Esophagus is grossly unremarkable.  Lungs/Pleura: No suspicious pulmonary nodules. Minimal scarring in the right lower lobe. Lungs are otherwise clear. No pleural fluid. Airway is unremarkable. Upper Abdomen: Visualized portions of the liver, gallbladder and adrenal glands are unremarkable. Large cystic structures in the right kidney with marked cortical thinning and areas of calcification, incompletely imaged. Visualized portions of the left kidney, spleen, pancreas, stomach and bowel are otherwise unremarkable. No upper abdominal adenopathy. Musculoskeletal: Degenerative changes in the spine. No worrisome lytic or sclerotic lesions. Irregular mass in the medial left breast measures 3.2 x 3.9 cm. IMPRESSION: 1. Left breast mass and indeterminate subcentimeter left axillary lymph node. Otherwise, no evidence of metastatic disease. 2. Cystic changes in the right kidney with associated renal cortical thinning and calcification, findings which can be seen with  chronic UPJ obstruction. However, full assessment is limited due to incomplete visualization and lack of delayed imaging. If further evaluation is desired, dedicated CT abdomen pelvis with contrast is suggested. 3. Aortic atherosclerosis (ICD10-I70.0). Coronary artery calcification. Electronically Signed   By: Lorin Picket M.D.   On: 02/12/2020 08:12   NM Bone Scan Whole Body  Result Date: 02/12/2020 CLINICAL DATA:  Breast cancer, staging examination EXAM: NUCLEAR MEDICINE WHOLE BODY BONE SCAN TECHNIQUE: Whole body anterior and posterior images were obtained approximately 3 hours after intravenous injection of radiopharmaceutical. RADIOPHARMACEUTICALS:  21.1 mCi Technetium-14m MDP IV COMPARISON:  None. FINDINGS: There is focal uptake of radiotracer within the anterior left chest likely representing uptake of tracer within the primary lesion seen on CT examination performed concurrently. Uptake of tracer within the mandible likely relates to periodontal disease. Defect within the left knee  likely relates to left total knee arthroplasty with stress reaction within the subjacent tibia. Uptake within the left shoulder and right knee is likely degenerative in nature. There is uptake and retention within the right retroperitoneum corresponding to thesevere hydronephrosis involving the visualized right kidney on accompanying CT examination. Normal uptake and excretion on the left into the bladder. IMPRESSION: Soft tissue uptake within the primary lesion within the left breast. No evidence of osseous metastatic disease. 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 (The patient's prior imaging was performed at [facility] in [place], [place] and the biopsy was performed at [facility] in [place], [place]). LABS:  Not applicable. EXAM: BILATERAL BREAST MRI WITH AND WITHOUT CONTRAST TECHNIQUE: Multiplanar, multisequence MR images of both breasts were obtained prior to and following the intravenous administration of 7 ml of Gadavist. Three-dimensional MR images were rendered by post-processing of the original MR data on an independent workstation. The three-dimensional MR images were interpreted, and findings are reported in the following complete MRI report for this study. Three dimensional images were evaluated at the independent interpreting workstation using the DynaCAD thin client. COMPARISON:  No prior MRI. Outside mammography and LEFT breast ultrasound is correlated. FINDINGS: Breast composition: b. Scattered fibroglandular tissue. Background parenchymal enhancement: Mild. Right breast: No mass or abnormal enhancement. Left breast: Enhancing heterogeneous irregular mass involving the LOWER INNER QUADRANT  measuring approximately 4.4 x 4.0 x 3.9 cm (transverse x AP x craniocaudal), extending to the skin surface of the INNER breast, corresponding to the biopsy-proven malignancy. No mass or abnormal enhancement elsewhere. Lymph nodes: No pathologic lymphadenopathy. Ancillary findings:  None. IMPRESSION: 1. Biopsy-proven malignancy involving the LOWER INNER QUADRANT of the LEFT breast measuring approximately 4.4 cm maximally. The mass extends to the skin surface of the INNER breast. 2. No MRI evidence of malignancy involving the RIGHT breast. 3. No pathologic lymphadenopathy. RECOMMENDATION: Treatment plan for the known LEFT breast malignancy. BI-RADS CATEGORY  6: Known biopsy-proven malignancy. Electronically Signed   By: Evangeline Dakin M.D.   On: 02/11/2020 15:59   ECHOCARDIOGRAM COMPLETE  Result Date: 02/12/2020    ECHOCARDIOGRAM REPORT   Patient Name:   Donna [name] Date of Exam: 02/12/2020 Medical Rec #:  [id number]        Height:       63.5 in Accession #:    [id number]  Weight:       157.1 lb Date of Birth:  09/24/1945        BSA:          1.755 m Patient Age:    60 years         BP:           162/84 mmHg Patient Gender: F                HR:           79 bpm. Exam Location:  Outpatient Procedure: 2D Echo Indications:    chemo eval  History:        Patient has no prior history of Echocardiogram examinations.                 Risk Factors:Hypertension. Cancer.  Sonographer:    Jannett Celestine RDCS (AE) Referring Phys: 37 GUSTAV Everett Graff  Sonographer Comments: challenging apical windows IMPRESSIONS  1. Small LV cavity with prominent papillary muscles mid/apical cavity obliteration in systole Normal GLS -18.4 . Left ventricular ejection fraction, by estimation, is 60 to 65%. The left ventricle has normal function. The left ventricle has no regional wall motion abnormalities. There is mild left ventricular hypertrophy. Left ventricular diastolic parameters were normal.  2. Right ventricular systolic  function is normal. The right ventricular size is normal.  3. Left atrial size was mildly dilated.  4. The mitral valve is normal in structure. No evidence of mitral valve regurgitation. No evidence of mitral stenosis.  5. The aortic valve is tricuspid. Aortic valve regurgitation is not visualized. Mild to moderate aortic valve sclerosis/calcification is present, without any evidence of aortic stenosis.  6. The inferior vena cava is normal in size with greater than 50% respiratory variability, suggesting right atrial pressure of 3 mmHg. FINDINGS  Left Ventricle: Small LV cavity with prominent papillary muscles mid/apical cavity obliteration in systole Normal GLS -18.4. Left ventricular ejection fraction, by estimation, is 60 to 65%. The left ventricle has normal function. The left ventricle has no regional wall motion abnormalities. The left ventricular internal cavity size was small. There is mild left ventricular hypertrophy. Left ventricular diastolic parameters were normal. Right Ventricle: The right ventricular size is normal. No increase in right ventricular wall thickness. Right ventricular systolic function is normal. Left Atrium: Left atrial size was mildly dilated. Right Atrium: Right atrial size was normal in size. Pericardium: There is no evidence of pericardial effusion. Mitral Valve: The mitral valve is normal in structure. Normal mobility of the mitral valve leaflets. No evidence of mitral valve regurgitation. No evidence of mitral valve stenosis. Tricuspid Valve: The tricuspid valve is normal in structure. Tricuspid valve regurgitation is mild . No evidence of tricuspid stenosis. Aortic Valve: The aortic valve is tricuspid. Aortic valve regurgitation is not visualized. Mild to moderate aortic valve sclerosis/calcification is present, without any evidence of aortic stenosis. Pulmonic Valve: The pulmonic valve was normal in structure. Pulmonic valve regurgitation is not visualized. No evidence of  pulmonic stenosis. Aorta: The aortic root is normal in size and structure. Venous: The inferior vena cava is normal in size with greater than 50% respiratory variability, suggesting right atrial pressure of 3 mmHg. IAS/Shunts: The interatrial septum was not well visualized.  LEFT VENTRICLE PLAX 2D LVIDd:         3.30 cm  Diastology LVIDs:         2.20 cm  LV e' lateral:   9.46 cm/s LV PW:  1.10 cm  LV E/e' lateral: 5.8 LV IVS:        1.20 cm  LV e' medial:    7.18 cm/s LVOT diam:     2.00 cm  LV E/e' medial:  7.6 LV SV:         99 LV SV Index:   57 LVOT Area:     3.14 cm  LEFT ATRIUM             Index       RIGHT ATRIUM           Index LA diam:        3.80 cm 2.16 cm/m  RA Area:     10.30 cm LA Vol (A2C):   31.0 ml 17.66 ml/m RA Volume:   16.50 ml  9.40 ml/m LA Vol (A4C):   37.5 ml 21.36 ml/m LA Biplane Vol: 35.9 ml 20.45 ml/m  AORTIC VALVE LVOT Vmax:   147.00 cm/s LVOT Vmean:  114.000 cm/s LVOT VTI:    0.316 m  AORTA Ao Root diam: 2.60 cm MITRAL VALVE MV Area (PHT): 2.54 cm    SHUNTS MV Decel Time: 299 msec    Systemic VTI:  0.32 m MV E velocity: 54.40 cm/s  Systemic Diam: 2.00 cm MV A velocity: 76.30 cm/s MV E/A ratio:  0.71 Jenkins Rouge MD Electronically signed by Jenkins Rouge MD Signature Date/Time: 02/12/2020/12:47:59 PM    Final     Labs:  CBC: No results for input(s): WBC, HGB, HCT, PLT in the last 8760 hours.  COAGS: No results for input(s): INR, APTT in the last 8760 hours.  BMP: Recent Labs    02/12/20 0734  CREATININE 1.00    LIVER FUNCTION TESTS: No results for input(s): BILITOT, AST, ALT, ALKPHOS, PROT, ALBUMIN in the last 8760 hours.  TUMOR MARKERS: No results for input(s): AFPTM, CEA, CA199, CHROMGRNA in the last 8760 hours.  Assessment and Plan:  Left Breast Cancer: Donna [name], 74 year old female, presents today to the [place] department for an image-guided port-a-catheter placement.  Risks and benefits of image-guided  Port-a-catheter placement were discussed with the patient including, but not limited to bleeding, infection, pneumothorax, or fibrin sheath development and need for additional procedures.  All of the patient's questions were answered, patient is agreeable to proceed.  The patient has been NPO. Vitals have been reviewed. She does not take any blood thinning medications.  Consent signed and in chart.  Thank you for this interesting consult.  I greatly enjoyed meeting Donna [name] and look forward to participating in their care.  A copy of this report was sent to the requesting provider on this date.  Electronically Signed: Soyla Dryer, AGACNP-BC [phone] 02/16/2020, 8:58 AM   I spent a total of  30 Minutes   in face to face in clinical consultation, greater than 50% of which was counseling/coordinating care for port-a-catheter placement.

## 2020-02-17 NOTE — Progress Notes (Signed)
Pharmacist Chemotherapy Monitoring - Initial Assessment    Anticipated start date: 02/23/20   Regimen:  . Are orders appropriate based on the patient's diagnosis, regimen, and cycle? Yes . Does the plan date match the patient's scheduled date? Yes . Is the sequencing of drugs appropriate? Yes . Are the premedications appropriate for the patient's regimen? Yes . Prior Authorization for treatment is: Pending o If applicable, is the correct biosimilar selected based on the patient's insurance? not applicable  Organ Function and Labs: Marland Kitchen Are dose adjustments needed based on the patient's renal function, hepatic function, or hematologic function? No . Are appropriate labs ordered prior to the start of patient's treatment? Yes . Other organ system assessment, if indicated: anthracyclines: Echo/ MUGA . The following baseline labs, if indicated, have been ordered: N/A  Dose Assessment: . Are the drug doses appropriate? Yes . Are the following correct: o Drug concentrations Yes o IV fluid compatible with drug Yes o Administration routes Yes o Timing of therapy Yes . If applicable, does the patient have documented access for treatment and/or plans for port-a-cath placement? not applicable . If applicable, have lifetime cumulative doses been properly documented and assessed? not applicable Lifetime Dose Tracking  No doses have been documented on this patient for the following tracked chemicals: Doxorubicin, Epirubicin, Idarubicin, Daunorubicin, Mitoxantrone, Bleomycin, Oxaliplatin, Carboplatin, Liposomal Doxorubicin  o   Toxicity Monitoring/Prevention: . The patient has the following take home antiemetics prescribed: Ondansetron, Prochlorperazine, Dexamethasone and Lorazepam . The patient has the following take home medications prescribed: N/A . Medication allergies and previous infusion related reactions, if applicable, have been reviewed and addressed. Yes . The patient's current medication  list has been assessed for drug-drug interactions with their chemotherapy regimen. no significant drug-drug interactions were identified on review.  Order Review: . Are the treatment plan orders signed? Yes . Is the patient scheduled to see a provider prior to their treatment? Yes  I verify that I have reviewed each item in the above checklist and answered each question accordingly.  Donna [name] K 02/17/2020 3:49 PM

## 2020-02-19 ENCOUNTER — Other Ambulatory Visit: Payer: Medicare Other

## 2020-02-22 ENCOUNTER — Other Ambulatory Visit: Payer: Self-pay | Admitting: *Deleted

## 2020-02-22 DIAGNOSIS — C50212 Malignant neoplasm of upper-inner quadrant of left female breast: Secondary | ICD-10-CM

## 2020-02-22 DIAGNOSIS — Z171 Estrogen receptor negative status [ER-]: Secondary | ICD-10-CM

## 2020-02-22 NOTE — Progress Notes (Signed)
[place]  [phone] [phone]     ID: Donna [name] DOB: 1945/10/26  MR#: [id number]  ZDG#:[id number]  Patient Care Team: Montez Morita, PA-C as PCP - General (Hematology and Oncology) Exander Shaul, [name], MD as Consulting Physician (Oncology) [name], MD as Consulting Physician (General Surgery) [name], MD as Consulting Physician (Orthopedic Surgery) [name], RN as Oncology Nurse Navigator [name], RN as Oncology Nurse Navigator Chauncey Cruel, MD OTHER MD: [name] MD ([place]); [name] NP  CHIEF COMPLAINT: Triple negative breast cancer  CURRENT TREATMENT: neoadjuvant chemotherapy   INTERVAL HISTORY: Donna [name] returns today for follow up of her triple negative breast cancer. She was evaluated in the  breast cancer clinic on 02/04/2020.  She is accompanied by her daughter, with whom she is currently staying.  Since consultation, she underwent breast MRI on 02/10/2020 showing: breast composition B; 4.4 cm biopsy-proven malignancy in lower-inner left breast, extending to skin surface of inner breast; no evidence of right breast malignancy or pathologic lymphadenopathy.  She also underwent staging scans on 02/12/2020. CT scan showed: left breast mass and indeterminate subcentimeter left axillary lymph node, otherwise no evidence of metastatic disease.  Bone scan also showed no evidence of osseous metastatic disease.  She also underwent echocardiogram the same day showing an ejection fraction of 60-65%.  She underwent port placement on 02/16/2020.  She is scheduled for her first dose of cyclophosphamide and doxorubicin today, to be followed by PEG fill gastrium on 02/25/2020   REVIEW OF SYSTEMS: Donna [name] is pleased that she is done with most of her appointments and is ready to start treatment.  She has a very good understanding of her treatments and appreciated meeting with our chemotherapy teaching nurse  and having a tour of the treatment area.  She tolerated port placement without any complications.  Detailed review of systems today was otherwise noncontributory   HISTORY OF CURRENT ILLNESS: From the original intake note:  Donna [name] herself palpated a mass in her left breast during showering.  She brought this to medical attention and on 12/16/2019 underwent mammography, the results of which I do not have today.  She proceeded to left breast ultrasound showing breast density B.  There was a mass in the upper inner quadrant of the left breast at the 10 o'clock position 7 cm from the nipple.  It measured 3.26 cm.  There are no ultrasound comments regarding the axilla.  Biopsy of the mass in question was performed 01/15/2020 at [place] in [facility] and the pathology report describes a biphasic tumor with malignant epithelial component and a mesenchymal component.  The epithelial component appears to squamoid and stain strongly for CK 8/18 by immunohistochemistry.  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  The tumor was essentially triple negative, estrogen receptor 0%, progesterone receptor 1% with 1+ intensity, HER-2 1+ by immunohistochemistry, Ki-67 12%.  (The 1% progesterone receptor positivity is best read as an artifact as the progesterone receptor does not function in the absence of a functional estrogen receptor).  The patient's subsequent history is as detailed below   PAST MEDICAL HISTORY: Past Medical History:  Diagnosis Date  . Arthritis    oa  . History of kidney stones   . Hypertension     PAST SURGICAL HISTORY: Past Surgical History:  Procedure Laterality Date  . CYSTOSCOPY  yrs ago  . IR IMAGING GUIDED PORT  INSERTION  02/16/2020  . TOTAL KNEE ARTHROPLASTY Left 06/05/2016   Procedure: LEFT TOTAL KNEE ARTHROPLASTY;  Surgeon: [name], MD;  Location: [facility];  Service: Orthopedics;   Laterality: Left;    FAMILY HISTORY No family history on file.  The patient has no information on her father.  Her mother died at the age of 8.  She had 1 sister and 2 brothers.  No one on that side of the family is known to have had cancer.  The patient herself had 5/2 siblings, 2 sisters and 3 brothers, none with a history of cancer.   GYNECOLOGIC HISTORY:  No LMP recorded. Patient is postmenopausal. Menarche: 74 years old Age at first live birth: 74 years old [place] P 2 LMP mid 8s Contraceptive no HRT no  Hysterectomy?  No Salpingo-oophorectomy?  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  She is divorced and her former husband subsequently died.  She lives by herself with no pets.  Her son Donna [name] 51 is a Production manager and travels a great deal as a Optometrist.  Daughter Donna [name] teaches middle school in [place].  The patient has 4 grandchildren, no great-grandchildren.  She is a Psychologist, forensic.    ADVANCED DIRECTIVES: Not in place   HEALTH MAINTENANCE: Social History   Tobacco Use  . Smoking status: Never Smoker  . Smokeless tobacco: Never Used  Substance Use Topics  . Alcohol use: No  . Drug use: No     Colonoscopy: Never  PAP: Remote  Bone density: Remote   Allergies  Allergen Reactions  . Benazepril Swelling    Patient had severe angioedema on 10/05/2015.    Current Outpatient Medications  Medication Sig Dispense Refill  . amLODipine (NORVASC) 10 MG tablet Take 10 mg by mouth daily after lunch.     Marland Kitchen aspirin 81 MG chewable tablet Chew 1 tablet (81 mg total) by mouth 2 (two) times daily. Take for 4 weeks. 60 tablet 0  . chlorthalidone (HYGROTON) 25 MG tablet Take 25 mg by mouth daily after lunch.     . Cholecalciferol (VITAMIN D) 2000 units CAPS Take 2,000 Units by mouth daily after lunch.    . dexamethasone (DECADRON) 4 MG tablet Take 2 tablets by mouth daily starting the day after Carboplatin and Cytoxan x 3  days. Take with food. 30 tablet 1  . lidocaine-prilocaine (EMLA) cream Apply to affected area once 30 g 3  . losartan (COZAAR) 50 MG tablet Take 50 mg by mouth daily after lunch.    . Multiple Vitamins-Minerals (CENTRUM ADULTS) TABS 1 tab(s)    . potassium chloride (MICRO-K) 10 MEQ CR capsule Take 10 mEq by mouth 2 (two) times daily.    . prochlorperazine (COMPAZINE) 10 MG tablet Take 1 tablet (10 mg total) by mouth every 6 (six) hours as needed (Nausea or vomiting). 30 tablet 1   No current facility-administered medications for this visit.   Facility-Administered Medications Ordered in Other Visits  Medication Dose Route Frequency Provider Last Rate Last Admin  . sodium chloride flush (NS) 0.9 % injection 10 mL  10 mL Intravenous PRN Madeleyn Schwimmer, [name], MD   10 mL at 02/23/20 0831    OBJECTIVE: African-American woman who appears well  Vitals:   02/23/20 0847  BP: (!) 148/69  Pulse: 79  Resp: 18  Temp: (!) 97.3 F (36.3 C)  SpO2: 99%     Body mass index is 27.99 kg/m.  Wt Readings from Last 3 Encounters:  02/23/20 158 lb (71.7 kg)  02/16/20 156 lb (70.8 kg)  02/04/20 157 lb 1.6 oz (71.3 kg)      ECOG FS:1 - Symptomatic but completely ambulatory  Sclerae unicteric, EOMs intact Wearing a mask No cervical or supraclavicular adenopathy Lungs no rales or rhonchi Heart regular rate and rhythm Abd soft, nontender, positive bowel sounds MSK no focal spinal tenderness, no upper extremity lymphedema Neuro: nonfocal, well oriented, appropriate affect Breasts: The mass in the left breast is very easy to observe and to palpate.  It is superior and medial and also there is a palpable mass inferiorly in that breast.  I do not palpate a mass in the axilla.   Left breast 02/04/2020    LAB RESULTS:  CMP     Component Value Date/Time   NA 137 06/06/2016 0442   K 3.3 (L) 06/06/2016 0442   CL 102 06/06/2016 0442   CO2 27 06/06/2016 0442   GLUCOSE 130 (H) 06/06/2016 0442   BUN  19 06/06/2016 0442   CREATININE 1.00 02/12/2020 0734   CALCIUM 9.8 06/06/2016 0442   GFRNONAA >60 06/06/2016 0442   GFRAA >60 06/06/2016 0442    No results found for: TOTALPROTELP, ALBUMINELP, A1GS, A2GS, BETS, BETA2SER, GAMS, MSPIKE, SPEI  No results found for: Ron Parker, [facility]  Lab Results  Component Value Date   WBC 5.4 02/23/2020   NEUTROABS 3.0 02/23/2020   HGB 14.0 02/23/2020   HCT 43.0 02/23/2020   MCV 84.6 02/23/2020   PLT 289 02/23/2020      Chemistry      Component Value Date/Time   NA 137 06/06/2016 0442   K 3.3 (L) 06/06/2016 0442   CL 102 06/06/2016 0442   CO2 27 06/06/2016 0442   BUN 19 06/06/2016 0442   CREATININE 1.00 02/12/2020 0734      Component Value Date/Time   CALCIUM 9.8 06/06/2016 0442       No results found for: LABCA2  No components found for: GOTLXB262  No results for input(s): INR in the last 168 hours.  No results found for: LABCA2  No results found for: MBT597  No results found for: CBU384  No results found for: TXM468  No results found for: CA2729  No components found for: HGQUANT  No results found for: CEA1 / No results found for: CEA1   No results found for: AFPTUMOR  No results found for: CHROMOGRNA  No results found for: HGBA, HGBA2QUANT, HGBFQUANT, HGBSQUAN (Hemoglobinopathy evaluation)   No results found for: LDH  No results found for: IRON, TIBC, IRONPCTSAT (Iron and TIBC)  No results found for: FERRITIN  Urinalysis No results found for: COLORURINE, APPEARANCEUR, LABSPEC, PHURINE, GLUCOSEU, HGBUR, BILIRUBINUR, KETONESUR, PROTEINUR, UROBILINOGEN, NITRITE, LEUKOCYTESUR   STUDIES: CT Chest W Contrast  Result Date: 02/12/2020 CLINICAL DATA:  Breast cancer. EXAM: CT CHEST WITH CONTRAST TECHNIQUE: Multidetector CT imaging of the chest was performed during intravenous contrast administration. CONTRAST:  49mL OMNIPAQUE IOHEXOL 300 MG/ML  SOLN COMPARISON:  None. FINDINGS: Cardiovascular:  Atherosclerotic calcification of the aorta and coronary arteries. Heart is mildly enlarged. No pericardial effusion. Mediastinum/Nodes: No pathologically enlarged mediastinal, hilar or internal mammary lymph nodes. Left axillary lymph nodes are not enlarged by CT size criteria, measuring up to 7 mm. Esophagus is grossly unremarkable. Lungs/Pleura: No suspicious pulmonary nodules. Minimal scarring in the right lower lobe. Lungs are otherwise clear. No pleural fluid. Airway is unremarkable. Upper Abdomen: Visualized portions of the liver, gallbladder and  adrenal glands are unremarkable. Large cystic structures in the right kidney with marked cortical thinning and areas of calcification, incompletely imaged. Visualized portions of the left kidney, spleen, pancreas, stomach and bowel are otherwise unremarkable. No upper abdominal adenopathy. Musculoskeletal: Degenerative changes in the spine. No worrisome lytic or sclerotic lesions. Irregular mass in the medial left breast measures 3.2 x 3.9 cm. IMPRESSION: 1. Left breast mass and indeterminate subcentimeter left axillary lymph node. Otherwise, no evidence of metastatic disease. 2. Cystic changes in the right kidney with associated renal cortical thinning and calcification, findings which can be seen with chronic UPJ obstruction. However, full assessment is limited due to incomplete visualization and lack of delayed imaging. If further evaluation is desired, dedicated CT abdomen pelvis with contrast is suggested. 3. Aortic atherosclerosis (ICD10-I70.0). Coronary artery calcification. Electronically Signed   By: Lorin Picket M.D.   On: 02/12/2020 08:12   NM Bone Scan Whole Body  Result Date: 02/12/2020 CLINICAL DATA:  Breast cancer, staging examination EXAM: NUCLEAR MEDICINE WHOLE BODY BONE SCAN TECHNIQUE: Whole body anterior and posterior images were obtained approximately 3 hours after intravenous injection of radiopharmaceutical. RADIOPHARMACEUTICALS:  21.1 mCi  Technetium-21m MDP IV COMPARISON:  None. FINDINGS: There is focal uptake of radiotracer within the anterior left chest likely representing uptake of tracer within the primary lesion seen on CT examination performed concurrently. Uptake of tracer within the mandible likely relates to periodontal disease. Defect within the left knee likely relates to left total knee arthroplasty with stress reaction within the subjacent tibia. Uptake within the left shoulder and right knee is likely degenerative in nature. There is uptake and retention within the right retroperitoneum corresponding to thesevere hydronephrosis involving the visualized right kidney on accompanying CT examination. Normal uptake and excretion on the left into the bladder. IMPRESSION: Soft tissue uptake within the primary lesion within the left breast. No evidence of osseous metastatic disease. 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 (The patient's prior imaging was performed at [facility] in [place], [place] and the biopsy was performed at [facility] in [place], [place]). LABS:  Not applicable. EXAM: BILATERAL BREAST MRI WITH AND WITHOUT CONTRAST TECHNIQUE: Multiplanar, multisequence MR images of both breasts were obtained prior to and following the intravenous administration of 7 ml of Gadavist. Three-dimensional MR images were rendered by post-processing of the original MR data on an independent workstation. The three-dimensional MR images were interpreted, and findings are reported in the following complete MRI report for this study. Three dimensional images were evaluated at the independent interpreting workstation using the DynaCAD thin client.  COMPARISON:  No prior MRI. Outside mammography and LEFT breast ultrasound is correlated. FINDINGS: Breast composition: b. Scattered fibroglandular tissue. Background parenchymal enhancement: Mild. Right breast: No mass or abnormal enhancement. Left breast: Enhancing heterogeneous irregular mass involving the LOWER INNER QUADRANT measuring approximately 4.4 x 4.0 x 3.9 cm (transverse x AP x craniocaudal), extending to the skin surface of the INNER breast, corresponding to the biopsy-proven malignancy. No mass or abnormal enhancement elsewhere. Lymph nodes: No pathologic lymphadenopathy. Ancillary findings:  None. IMPRESSION: 1. Biopsy-proven malignancy involving the LOWER INNER QUADRANT of the LEFT breast measuring approximately 4.4 cm maximally. The mass extends to the skin surface of the  INNER breast. 2. No MRI evidence of malignancy involving the RIGHT breast. 3. No pathologic lymphadenopathy. RECOMMENDATION: Treatment plan for the known LEFT breast malignancy. BI-RADS CATEGORY  6: Known biopsy-proven malignancy. Electronically Signed   By: Hulan Saas M.D.   On: 02/11/2020 15:59   ECHOCARDIOGRAM COMPLETE  Result Date: 02/12/2020    ECHOCARDIOGRAM REPORT   Patient Name:   TRICHA RUGGIRELLO Date of Exam: 02/12/2020 Medical Rec #:  [id number]        Height:       63.5 in Accession #:    [id number]       Weight:       157.1 lb Date of Birth:  1945/08/12        BSA:          1.755 m Patient Age:    74 years         BP:           162/84 mmHg Patient Gender: F                HR:           79 bpm. Exam Location:  Outpatient Procedure: 2D Echo Indications:    chemo eval  History:        Patient has no prior history of Echocardiogram examinations.                 Risk Factors:Hypertension. Cancer.  Sonographer:    Celene Skeen RDCS (AE) Referring Phys: 32 Satoya Feeley Deberah Pelton  Sonographer Comments: challenging apical windows IMPRESSIONS  1. Small LV cavity with prominent papillary muscles mid/apical cavity obliteration  in systole Normal GLS -18.4 . Left ventricular ejection fraction, by estimation, is 60 to 65%. The left ventricle has normal function. The left ventricle has no regional wall motion abnormalities. There is mild left ventricular hypertrophy. Left ventricular diastolic parameters were normal.  2. Right ventricular systolic function is normal. The right ventricular size is normal.  3. Left atrial size was mildly dilated.  4. The mitral valve is normal in structure. No evidence of mitral valve regurgitation. No evidence of mitral stenosis.  5. The aortic valve is tricuspid. Aortic valve regurgitation is not visualized. Mild to moderate aortic valve sclerosis/calcification is present, without any evidence of aortic stenosis.  6. The inferior vena cava is normal in size with greater than 50% respiratory variability, suggesting right atrial pressure of 3 mmHg. FINDINGS  Left Ventricle: Small LV cavity with prominent papillary muscles mid/apical cavity obliteration in systole Normal GLS -18.4. Left ventricular ejection fraction, by estimation, is 60 to 65%. The left ventricle has normal function. The left ventricle has no regional wall motion abnormalities. The left ventricular internal cavity size was small. There is mild left ventricular hypertrophy. Left ventricular diastolic parameters were normal. Right Ventricle: The right ventricular size is normal. No increase in right ventricular wall thickness. Right ventricular systolic function is normal. Left Atrium: Left atrial size was mildly dilated. Right Atrium: Right atrial size was normal in size. Pericardium: There is no evidence of pericardial effusion. Mitral Valve: The mitral valve is normal in structure. Normal mobility of the mitral valve leaflets. No evidence of mitral valve regurgitation. No evidence of mitral valve stenosis. Tricuspid Valve: The tricuspid valve is normal in structure. Tricuspid valve regurgitation is mild . No evidence of tricuspid stenosis.  Aortic Valve: The aortic valve is tricuspid. Aortic valve regurgitation is not visualized. Mild to moderate aortic valve sclerosis/calcification is present, without any evidence of aortic  stenosis. Pulmonic Valve: The pulmonic valve was normal in structure. Pulmonic valve regurgitation is not visualized. No evidence of pulmonic stenosis. Aorta: The aortic root is normal in size and structure. Venous: The inferior vena cava is normal in size with greater than 50% respiratory variability, suggesting right atrial pressure of 3 mmHg. IAS/Shunts: The interatrial septum was not well visualized.  LEFT VENTRICLE PLAX 2D LVIDd:         3.30 cm  Diastology LVIDs:         2.20 cm  LV e' lateral:   9.46 cm/s LV PW:         1.10 cm  LV E/e' lateral: 5.8 LV IVS:        1.20 cm  LV e' medial:    7.18 cm/s LVOT diam:     2.00 cm  LV E/e' medial:  7.6 LV SV:         99 LV SV Index:   57 LVOT Area:     3.14 cm  LEFT ATRIUM             Index       RIGHT ATRIUM           Index LA diam:        3.80 cm 2.16 cm/m  RA Area:     10.30 cm LA Vol (A2C):   31.0 ml 17.66 ml/m RA Volume:   16.50 ml  9.40 ml/m LA Vol (A4C):   37.5 ml 21.36 ml/m LA Biplane Vol: 35.9 ml 20.45 ml/m  AORTIC VALVE LVOT Vmax:   147.00 cm/s LVOT Vmean:  114.000 cm/s LVOT VTI:    0.316 m  AORTA Ao Root diam: 2.60 cm MITRAL VALVE MV Area (PHT): 2.54 cm    SHUNTS MV Decel Time: 299 msec    Systemic VTI:  0.32 m MV E velocity: 54.40 cm/s  Systemic Diam: 2.00 cm MV A velocity: 76.30 cm/s MV E/A ratio:  0.71 Jenkins Rouge MD Electronically signed by Jenkins Rouge MD Signature Date/Time: 02/12/2020/12:47:59 PM    Final    IR IMAGING GUIDED PORT INSERTION  Result Date: 02/16/2020 INDICATION: 74 year old female referred for port catheter placement EXAM: IMAGE GUIDED PORT CATHETER PLACEMENT MEDICATIONS: 2 g Ancef; The antibiotic was administered within an appropriate time interval prior to skin puncture. ANESTHESIA/SEDATION: Moderate (conscious) sedation was employed  during this procedure. A total of Versed 1.0 mg and Fentanyl 50 mcg was administered intravenously. Moderate Sedation Time: 24 minutes. The patient's level of consciousness and vital signs were monitored continuously by radiology nursing throughout the procedure under my direct supervision. FLUOROSCOPY TIME:  Fluoroscopy Time: 0 minutes 6 seconds (0 mGy). COMPLICATIONS: None PROCEDURE: The procedure, risks, benefits, and alternatives were explained to the patient. Questions regarding the procedure were encouraged and answered. The patient understands and consents to the procedure. Ultrasound survey was performed with images stored and sent to PACs. The right neck and chest was prepped with chlorhexidine, and draped in the usual sterile fashion using maximum barrier technique (cap and mask, sterile gown, sterile gloves, large sterile sheet, hand hygiene and cutaneous antiseptic). Antibiotic prophylaxis was provided with 2.0g Ancef administered IV one hour prior to skin incision. Local anesthesia was attained by infiltration with 1% lidocaine without epinephrine. Ultrasound demonstrated patency of the right internal jugular vein, and this was documented with an image. Under real-time ultrasound guidance, this vein was accessed with a 21 gauge micropuncture needle and image documentation was performed. A small dermatotomy was made at  the access site with an 11 scalpel. A 0.018" wire was advanced into the SVC and used to estimate the length of the internal catheter. The access needle exchanged for a 61F micropuncture vascular sheath. The 0.018" wire was then removed and a 0.035" wire advanced into the IVC. An appropriate location for the subcutaneous reservoir was selected below the clavicle and an incision was made through the skin and underlying soft tissues. The subcutaneous tissues were then dissected using a combination of blunt and sharp surgical technique and a pocket was formed. A single lumen power injectable  portacatheter was then tunneled through the subcutaneous tissues from the pocket to the dermatotomy and the port reservoir placed within the subcutaneous pocket. The venous access site was then serially dilated and a peel away vascular sheath placed over the wire. The wire was removed and the port catheter advanced into position under fluoroscopic guidance. The catheter tip is positioned in the cavoatrial junction. This was documented with a spot image. The portacatheter was then tested and found to flush and aspirate well. The port was flushed with saline followed by 100 units/mL heparinized saline. The pocket was then closed in two layers using first subdermal inverted interrupted absorbable sutures followed by a running subcuticular suture. The epidermis was then sealed with Dermabond. The dermatotomy at the venous access site was also seal with Dermabond. Patient tolerated the procedure well and remained hemodynamically stable throughout. No complications encountered and no significant blood loss encountered IMPRESSION: Status post right IJ port catheter placement. Signed, Dulcy Fanny. Dellia Nims, RPVI [facility] Electronically Signed   By: Corrie Mckusick D.O.   On: 02/16/2020 10:22    ELIGIBLE FOR AVAILABLE RESEARCH PROTOCOL: no  ASSESSMENT: 74 y.o. [place] woman status post left breast upper inner quadrant biopsy 01/15/2020 for a clinical T2 NX, stage IIB anaplastic carcinoma, triple negative, with an MIB-1 of 12%  (a) bone scan and CT chest 02/12/2020 show no evidence of metastatic disease  (1) neoadjuvant chemotherapy with doxorubicin and cyclophosphamide in dose dense fashion x4 started 02/23/2020, to be followed by weekly paclitaxel and carboplatin x12  (a) echo 0903 shows an ejection fraction in the 60-65% range  (2) definitive surgery to follow  (3) adjuvant radiation as appropriate   PLAN: Shyan staging studies are  very favorable, and.  We reviewed her echocardiogram results and I showed her images of her tumor and the MRI, which is very clear.  They took pictures of that for the rest of the family.  Our goal is to have a complete radiologic and then a complete pathologic response if we can obtain that.  We reviewed the chemotherapy plan.  She will receive her first dose today.  She knows how to take her supportive medications the next 2 days.  She will see me again in a week to check nadir counts and tolerance and then we will proceed with cycle 2 hopefully 2 weeks from now  They know to call for any other issue that may develop before the next visit.  Total encounter time 30 minutes.Chauncey Cruel, MD   02/23/2020 9:14 AM Medical Oncology and Hematology [facility] [place], [place] [address] Tel. [phone]    Fax. [phone]   I, Wilburn Mylar, am acting as scribe for Dr. [name]. Yaris Ferrell.  I, Lurline Del MD, have reviewed the above documentation for accuracy and completeness, and I agree with the above.    *  Total Encounter Time as defined by the Centers for Medicare and Medicaid Services includes, in addition to the face-to-face time of a patient visit (documented in the note above) non-face-to-face time: obtaining and reviewing outside history, ordering and reviewing medications, tests or procedures, care coordination (communications with other health care professionals or caregivers) and documentation in the medical record.

## 2020-02-23 ENCOUNTER — Inpatient Hospital Stay: Payer: Medicare PPO | Attending: Oncology

## 2020-02-23 ENCOUNTER — Inpatient Hospital Stay: Payer: Medicare PPO

## 2020-02-23 ENCOUNTER — Telehealth: Payer: Self-pay | Admitting: Oncology

## 2020-02-23 ENCOUNTER — Encounter: Payer: Self-pay | Admitting: *Deleted

## 2020-02-23 ENCOUNTER — Other Ambulatory Visit: Payer: Self-pay

## 2020-02-23 ENCOUNTER — Inpatient Hospital Stay (HOSPITAL_BASED_OUTPATIENT_CLINIC_OR_DEPARTMENT_OTHER): Payer: Medicare PPO | Admitting: Oncology

## 2020-02-23 VITALS — BP 148/69 | HR 79 | Temp 97.3°F | Resp 18 | Ht 63.0 in | Wt 158.0 lb

## 2020-02-23 DIAGNOSIS — C50212 Malignant neoplasm of upper-inner quadrant of left female breast: Secondary | ICD-10-CM | POA: Diagnosis present

## 2020-02-23 DIAGNOSIS — Z5189 Encounter for other specified aftercare: Secondary | ICD-10-CM | POA: Diagnosis not present

## 2020-02-23 DIAGNOSIS — C50919 Malignant neoplasm of unspecified site of unspecified female breast: Secondary | ICD-10-CM | POA: Diagnosis not present

## 2020-02-23 DIAGNOSIS — Z7982 Long term (current) use of aspirin: Secondary | ICD-10-CM | POA: Diagnosis not present

## 2020-02-23 DIAGNOSIS — Z79899 Other long term (current) drug therapy: Secondary | ICD-10-CM | POA: Insufficient documentation

## 2020-02-23 DIAGNOSIS — Z171 Estrogen receptor negative status [ER-]: Secondary | ICD-10-CM

## 2020-02-23 DIAGNOSIS — Z5111 Encounter for antineoplastic chemotherapy: Secondary | ICD-10-CM | POA: Diagnosis present

## 2020-02-23 DIAGNOSIS — I10 Essential (primary) hypertension: Secondary | ICD-10-CM | POA: Insufficient documentation

## 2020-02-23 DIAGNOSIS — Z923 Personal history of irradiation: Secondary | ICD-10-CM | POA: Insufficient documentation

## 2020-02-23 DIAGNOSIS — Z95828 Presence of other vascular implants and grafts: Secondary | ICD-10-CM

## 2020-02-23 LAB — CMP (CANCER CENTER ONLY)
ALT: 11 U/L (ref 0–44)
AST: 15 U/L (ref 15–41)
Albumin: 3.7 g/dL (ref 3.5–5.0)
Alkaline Phosphatase: 75 U/L (ref 38–126)
Anion gap: 5 (ref 5–15)
BUN: 16 mg/dL (ref 8–23)
CO2: 27 mmol/L (ref 22–32)
Calcium: 10.9 mg/dL — ABNORMAL HIGH (ref 8.9–10.3)
Chloride: 103 mmol/L (ref 98–111)
Creatinine: 0.91 mg/dL (ref 0.44–1.00)
GFR, Est AFR Am: >60 mL/min (ref >60)
GFR, Estimated: >60 mL/min (ref >60)
Glucose, Bld: 178 mg/dL — ABNORMAL HIGH (ref 70–99)
Potassium: 3.3 mmol/L — ABNORMAL LOW (ref 3.5–5.1)
Sodium: 135 mmol/L (ref 135–145)
Total Bilirubin: 0.4 mg/dL (ref 0.3–1.2)
Total Protein: 7.9 g/dL (ref 6.5–8.1)

## 2020-02-23 LAB — CBC WITH DIFFERENTIAL (CANCER CENTER ONLY)
Abs Immature Granulocytes: 0.06 10*3/uL (ref 0.00–0.07)
Basophils Absolute: 0 10*3/uL (ref 0.0–0.1)
Basophils Relative: 1 %
Eosinophils Absolute: 0 10*3/uL (ref 0.0–0.5)
Eosinophils Relative: 0 %
HCT: 43 % (ref 36.0–46.0)
Hemoglobin: 14 g/dL (ref 12.0–15.0)
Immature Granulocytes: 1 %
Lymphocytes Relative: 37 %
Lymphs Abs: 2 10*3/uL (ref 0.7–4.0)
MCH: 27.6 pg (ref 26.0–34.0)
MCHC: 32.6 g/dL (ref 30.0–36.0)
MCV: 84.6 fL (ref 80.0–100.0)
Monocytes Absolute: 0.4 10*3/uL (ref 0.1–1.0)
Monocytes Relative: 6 %
Neutro Abs: 3 10*3/uL (ref 1.7–7.7)
Neutrophils Relative %: 55 %
Platelet Count: 289 10*3/uL (ref 150–400)
RBC: 5.08 MIL/uL (ref 3.87–5.11)
RDW: 14.5 % (ref 11.5–15.5)
WBC Count: 5.4 10*3/uL (ref 4.0–10.5)
nRBC: 0 % (ref 0.0–0.2)

## 2020-02-23 MED ORDER — DOXORUBICIN HCL CHEMO IV INJECTION 2 MG/ML
60.0000 mg/m2 | Freq: Once | INTRAVENOUS | Status: AC
  Administered 2020-02-23: 108 mg via INTRAVENOUS
  Filled 2020-02-23: qty 54

## 2020-02-23 MED ORDER — SODIUM CHLORIDE 0.9% FLUSH
10.0000 mL | INTRAVENOUS | Status: DC | PRN
  Administered 2020-02-23: 10 mL via INTRAVENOUS
  Filled 2020-02-23: qty 10

## 2020-02-23 MED ORDER — HEPARIN SOD (PORK) LOCK FLUSH 100 UNIT/ML IV SOLN
500.0000 [IU] | Freq: Once | INTRAVENOUS | Status: AC | PRN
  Administered 2020-02-23: 500 [IU]
  Filled 2020-02-23: qty 5

## 2020-02-23 MED ORDER — SODIUM CHLORIDE 0.9 % IV SOLN
150.0000 mg | Freq: Once | INTRAVENOUS | Status: AC
  Administered 2020-02-23: 150 mg via INTRAVENOUS
  Filled 2020-02-23: qty 150

## 2020-02-23 MED ORDER — SODIUM CHLORIDE 0.9% FLUSH
10.0000 mL | INTRAVENOUS | Status: DC | PRN
  Administered 2020-02-23: 10 mL
  Filled 2020-02-23: qty 10

## 2020-02-23 MED ORDER — PALONOSETRON HCL INJECTION 0.25 MG/5ML
0.2500 mg | Freq: Once | INTRAVENOUS | Status: AC
  Administered 2020-02-23: 0.25 mg via INTRAVENOUS

## 2020-02-23 MED ORDER — SODIUM CHLORIDE 0.9 % IV SOLN
600.0000 mg/m2 | Freq: Once | INTRAVENOUS | Status: AC
  Administered 2020-02-23: 1080 mg via INTRAVENOUS
  Filled 2020-02-23: qty 54

## 2020-02-23 MED ORDER — SODIUM CHLORIDE 0.9 % IV SOLN
Freq: Once | INTRAVENOUS | Status: AC
  Filled 2020-02-23: qty 250

## 2020-02-23 MED ORDER — SODIUM CHLORIDE 0.9 % IV SOLN
10.0000 mg | Freq: Once | INTRAVENOUS | Status: AC
  Administered 2020-02-23: 10 mg via INTRAVENOUS
  Filled 2020-02-23: qty 10

## 2020-02-23 MED ORDER — PALONOSETRON HCL INJECTION 0.25 MG/5ML
INTRAVENOUS | Status: AC
  Filled 2020-02-23: qty 5

## 2020-02-23 NOTE — Addendum Note (Signed)
Addended by: Chauncey Cruel on: 02/23/2020 09:38 AM   Modules accepted: Orders

## 2020-02-23 NOTE — Patient Instructions (Signed)
[facility] Discharge Instructions for Patients Receiving Chemotherapy  Today you received the following chemotherapy agents Adriamycin and Cytoxan  To help prevent nausea and vomiting after your treatment, we encourage you to take your nausea medication as directed.  If you develop nausea and vomiting that is not controlled by your nausea medication, call the clinic.   BELOW ARE SYMPTOMS THAT SHOULD BE REPORTED IMMEDIATELY:  *FEVER GREATER THAN 100.5 F  *CHILLS WITH OR WITHOUT FEVER  NAUSEA AND VOMITING THAT IS NOT CONTROLLED WITH YOUR NAUSEA MEDICATION  *UNUSUAL SHORTNESS OF BREATH  *UNUSUAL BRUISING OR BLEEDING  TENDERNESS IN MOUTH AND THROAT WITH OR WITHOUT PRESENCE OF ULCERS  *URINARY PROBLEMS  *BOWEL PROBLEMS  UNUSUAL RASH Items with * indicate a potential emergency and should be followed up as soon as possible.  Feel free to call the clinic should you have any questions or concerns. The clinic phone number is [phone].  Please show the [facility] at check-in to the Emergency Department and triage nurse.  Doxorubicin injection What is this medicine? DOXORUBICIN (dox oh ROO bi sin) is a chemotherapy drug. It is used to treat many kinds of cancer like leukemia, lymphoma, neuroblastoma, sarcoma, and Wilms' tumor. It is also used to treat bladder cancer, breast cancer, lung cancer, ovarian cancer, stomach cancer, and thyroid cancer. This medicine may be used for other purposes; ask your health care provider or pharmacist if you have questions. COMMON BRAND NAME(S): Adriamycin, Adriamycin PFS, Adriamycin RDF, Rubex What should I tell my health care provider before I take this medicine? They need to know if you have any of these conditions:  heart disease  history of low blood counts caused by a medicine  liver disease  recent or ongoing radiation therapy  an unusual or allergic reaction to doxorubicin, other chemotherapy agents, other  medicines, foods, dyes, or preservatives  pregnant or trying to get pregnant  breast-feeding How should I use this medicine? This drug is given as an infusion into a vein. It is administered in a hospital or clinic by a specially trained health care professional. If you have pain, swelling, burning or any unusual feeling around the site of your injection, tell your health care professional right away. Talk to your pediatrician regarding the use of this medicine in children. Special care may be needed. Overdosage: If you think you have taken too much of this medicine contact a poison control center or emergency room at once. NOTE: This medicine is only for you. Do not share this medicine with others. What if I miss a dose? It is important not to miss your dose. Call your doctor or health care professional if you are unable to keep an appointment. What may interact with this medicine? This medicine may interact with the following medications:  6-mercaptopurine  paclitaxel  phenytoin  St. John's Wort  trastuzumab  verapamil This list may not describe all possible interactions. Give your health care provider a list of all the medicines, herbs, non-prescription drugs, or dietary supplements you use. Also tell them if you smoke, drink alcohol, or use illegal drugs. Some items may interact with your medicine. What should I watch for while using this medicine? This drug may make you feel generally unwell. This is not uncommon, as chemotherapy can affect healthy cells as well as cancer cells. Report any side effects. Continue your course of treatment even though you feel ill unless your doctor tells you to stop. There is a maximum amount of  this medicine you should receive throughout your life. The amount depends on the medical condition being treated and your overall health. Your doctor will watch how much of this medicine you receive in your lifetime. Tell your doctor if you have taken this  medicine before. You may need blood work done while you are taking this medicine. Your urine may turn red for a few days after your dose. This is not blood. If your urine is dark or brown, call your doctor. In some cases, you may be given additional medicines to help with side effects. Follow all directions for their use. Call your doctor or health care professional for advice if you get a fever, chills or sore throat, or other symptoms of a cold or flu. Do not treat yourself. This drug decreases your body's ability to fight infections. Try to avoid being around people who are sick. This medicine may increase your risk to bruise or bleed. Call your doctor or health care professional if you notice any unusual bleeding. Talk to your doctor about your risk of cancer. You may be more at risk for certain types of cancers if you take this medicine. Do not become pregnant while taking this medicine or for 6 months after stopping it. Women should inform their doctor if they wish to become pregnant or think they might be pregnant. Men should not father a child while taking this medicine and for 6 months after stopping it. There is a potential for serious side effects to an unborn child. Talk to your health care professional or pharmacist for more information. Do not breast-feed an infant while taking this medicine. This medicine has caused ovarian failure in some women and reduced sperm counts in some men This medicine may interfere with the ability to have a child. Talk with your doctor or health care professional if you are concerned about your fertility. This medicine may cause a decrease in Co-Enzyme Q-10. You should make sure that you get enough Co-Enzyme Q-10 while you are taking this medicine. Discuss the foods you eat and the vitamins you take with your health care professional. What side effects may I notice from receiving this medicine? Side effects that you should report to your doctor or health care  professional as soon as possible:  allergic reactions like skin rash, itching or hives, swelling of the face, lips, or tongue  breathing problems  chest pain  fast or irregular heartbeat  low blood counts - this medicine may decrease the number of white blood cells, red blood cells and platelets. You may be at increased risk for infections and bleeding.  pain, redness, or irritation at site where injected  signs of infection - fever or chills, cough, sore throat, pain or difficulty passing urine  signs of decreased platelets or bleeding - bruising, pinpoint red spots on the skin, black, tarry stools, blood in the urine  swelling of the ankles, feet, hands  tiredness  weakness Side effects that usually do not require medical attention (report to your doctor or health care professional if they continue or are bothersome):  diarrhea  hair loss  mouth sores  nail discoloration or damage  nausea  red colored urine  vomiting This list may not describe all possible side effects. Call your doctor for medical advice about side effects. You may report side effects to FDA at 1-800-FDA-1088. Where should I keep my medicine? This drug is given in a hospital or clinic and will not be stored at home. NOTE:  This sheet is a summary. It may not cover all possible information. If you have questions about this medicine, talk to your doctor, pharmacist, or health care provider.  2020 Elsevier/Gold Standard (2017-01-09 11:01:26)  Cyclophosphamide Injection What is this medicine? CYCLOPHOSPHAMIDE (sye kloe FOSS fa mide) is a chemotherapy drug. It slows the growth of cancer cells. This medicine is used to treat many types of cancer like lymphoma, myeloma, leukemia, breast cancer, and ovarian cancer, to name a few. This medicine may be used for other purposes; ask your health care provider or pharmacist if you have questions. COMMON BRAND NAME(S): Cytoxan, Neosar What should I tell my health  care provider before I take this medicine? They need to know if you have any of these conditions:  heart disease  history of irregular heartbeat  infection  kidney disease  liver disease  low blood counts, like white cells, platelets, or red blood cells  on hemodialysis  recent or ongoing radiation therapy  scarring or thickening of the lungs  trouble passing urine  an unusual or allergic reaction to cyclophosphamide, other medicines, foods, dyes, or preservatives  pregnant or trying to get pregnant  breast-feeding How should I use this medicine? This drug is usually given as an injection into a vein or muscle or by infusion into a vein. It is administered in a hospital or clinic by a specially trained health care professional. Talk to your pediatrician regarding the use of this medicine in children. Special care may be needed. Overdosage: If you think you have taken too much of this medicine contact a poison control center or emergency room at once. NOTE: This medicine is only for you. Do not share this medicine with others. What if I miss a dose? It is important not to miss your dose. Call your doctor or health care professional if you are unable to keep an appointment. What may interact with this medicine?  amphotericin B  azathioprine  certain antivirals for HIV or hepatitis  certain medicines for blood pressure, heart disease, irregular heart beat  certain medicines that treat or prevent blood clots like warfarin  certain other medicines for cancer  cyclosporine  etanercept  indomethacin  medicines that relax muscles for surgery  medicines to increase blood counts  metronidazole This list may not describe all possible interactions. Give your health care provider a list of all the medicines, herbs, non-prescription drugs, or dietary supplements you use. Also tell them if you smoke, drink alcohol, or use illegal drugs. Some items may interact with your  medicine. What should I watch for while using this medicine? Your condition will be monitored carefully while you are receiving this medicine. You may need blood work done while you are taking this medicine. Drink water or other fluids as directed. Urinate often, even at night. Some products may contain alcohol. Ask your health care professional if this medicine contains alcohol. Be sure to tell all health care professionals you are taking this medicine. Certain medicines, like metronidazole and disulfiram, can cause an unpleasant reaction when taken with alcohol. The reaction includes flushing, headache, nausea, vomiting, sweating, and increased thirst. The reaction can last from 30 minutes to several hours. Do not become pregnant while taking this medicine or for 1 year after stopping it. Women should inform their health care professional if they wish to become pregnant or think they might be pregnant. Men should not father a child while taking this medicine and for 4 months after stopping it. There is potential   for serious side effects to an unborn child. Talk to your health care professional for more information. Do not breast-feed an infant while taking this medicine or for 1 week after stopping it. This medicine has caused ovarian failure in some women. This medicine may make it more difficult to get pregnant. Talk to your health care professional if you are concerned about your fertility. This medicine has caused decreased sperm counts in some men. This may make it more difficult to father a child. Talk to your health care professional if you are concerned about your fertility. Call your health care professional for advice if you get a fever, chills, or sore throat, or other symptoms of a cold or flu. Do not treat yourself. This medicine decreases your body's ability to fight infections. Try to avoid being around people who are sick. Avoid taking medicines that contain aspirin, acetaminophen,  ibuprofen, naproxen, or ketoprofen unless instructed by your health care professional. These medicines may hide a fever. Talk to your health care professional about your risk of cancer. You may be more at risk for certain types of cancer if you take this medicine. If you are going to need surgery or other procedure, tell your health care professional that you are using this medicine. Be careful brushing or flossing your teeth or using a toothpick because you may get an infection or bleed more easily. If you have any dental work done, tell your dentist you are receiving this medicine. What side effects may I notice from receiving this medicine? Side effects that you should report to your doctor or health care professional as soon as possible:  allergic reactions like skin rash, itching or hives, swelling of the face, lips, or tongue  breathing problems  nausea, vomiting  signs and symptoms of bleeding such as bloody or black, tarry stools; red or dark brown urine; spitting up blood or brown material that looks like coffee grounds; red spots on the skin; unusual bruising or bleeding from the eyes, gums, or nose  signs and symptoms of heart failure like fast, irregular heartbeat, sudden weight gain; swelling of the ankles, feet, hands  signs and symptoms of infection like fever; chills; cough; sore throat; pain or trouble passing urine  signs and symptoms of kidney injury like trouble passing urine or change in the amount of urine  signs and symptoms of liver injury like dark yellow or brown urine; general ill feeling or flu-like symptoms; light-colored stools; loss of appetite; nausea; right upper belly pain; unusually weak or tired; yellowing of the eyes or skin Side effects that usually do not require medical attention (report to your doctor or health care professional if they continue or are bothersome):  confusion  decreased hearing  diarrhea  facial flushing  hair  loss  headache  loss of appetite  missed menstrual periods  signs and symptoms of low red blood cells or anemia such as unusually weak or tired; feeling faint or lightheaded; falls  skin discoloration This list may not describe all possible side effects. Call your doctor for medical advice about side effects. You may report side effects to FDA at 1-800-FDA-1088. Where should I keep my medicine? This drug is given in a hospital or clinic and will not be stored at home. NOTE: This sheet is a summary. It may not cover all possible information. If you have questions about this medicine, talk to your doctor, pharmacist, or health care provider.  2020 Elsevier/Gold Standard (2019-03-02 09:53:29)

## 2020-02-23 NOTE — Patient Instructions (Signed)

## 2020-02-23 NOTE — Telephone Encounter (Signed)
Scheduled appts per 9/14 los. Gave pt's daughter a print out of AVS.

## 2020-02-24 ENCOUNTER — Other Ambulatory Visit: Payer: Medicare Other

## 2020-02-24 ENCOUNTER — Ambulatory Visit: Payer: Medicare Other | Admitting: Oncology

## 2020-02-25 ENCOUNTER — Other Ambulatory Visit: Payer: Self-pay

## 2020-02-25 ENCOUNTER — Inpatient Hospital Stay: Payer: Medicare PPO

## 2020-02-25 VITALS — BP 141/80 | HR 90 | Resp 18

## 2020-02-25 DIAGNOSIS — C50919 Malignant neoplasm of unspecified site of unspecified female breast: Secondary | ICD-10-CM

## 2020-02-25 DIAGNOSIS — Z171 Estrogen receptor negative status [ER-]: Secondary | ICD-10-CM

## 2020-02-25 DIAGNOSIS — Z5111 Encounter for antineoplastic chemotherapy: Secondary | ICD-10-CM | POA: Diagnosis not present

## 2020-02-25 MED ORDER — PEGFILGRASTIM-JMDB 6 MG/0.6ML ~~LOC~~ SOSY
6.0000 mg | PREFILLED_SYRINGE | Freq: Once | SUBCUTANEOUS | Status: AC
  Administered 2020-02-25: 6 mg via SUBCUTANEOUS

## 2020-02-25 MED ORDER — PEGFILGRASTIM-JMDB 6 MG/0.6ML ~~LOC~~ SOSY
PREFILLED_SYRINGE | SUBCUTANEOUS | Status: AC
  Filled 2020-02-25: qty 0.6

## 2020-02-25 NOTE — Patient Instructions (Signed)

## 2020-02-28 NOTE — Progress Notes (Signed)
[place]  [phone] [phone]     ID: Crystle Carelli DOB: Sep 20, 1945  MR#: [id number]  UUV#:[id number]  Patient Care Team: Montez Morita, PA-C as PCP - General (Hematology and Oncology) Annai Heick, [name], MD as Consulting Physician (Oncology) [name], MD as Consulting Physician (General Surgery) [name], MD as Consulting Physician (Orthopedic Surgery) [name], RN as Oncology Nurse Navigator [name], RN as Oncology Nurse Navigator Chauncey Cruel, MD OTHER MD: [name] MD ([place]); [name] NP  CHIEF COMPLAINT: Triple negative breast cancer  CURRENT TREATMENT: neoadjuvant chemotherapy   INTERVAL HISTORY: Rae returns today for follow up of her triple negative breast cancer accompanied by her son [name].   She began neoadjuvant chemotherapy, consisting of cyclophosphamide and doxorubicin with pegfilgrastim on day 3, on 02/23/2020. Today is day 7 cycle 1.   REVIEW OF SYSTEMS: Kylee did remarkably well with her first cycle of chemotherapy.  She felt a little woozy and tired for a couple of days.  She had no problems sleeping.  She had no nausea no mouth sores and no problems with her port.  She received her immune booster shot on day 3 with no bony aches or pains or other side effects.  Overall she has done remarkably "fine".  Her son agrees that she has done very well.   HISTORY OF CURRENT ILLNESS: From the original intake note:  Minta herself palpated a mass in her left breast during showering.  She brought this to medical attention and on 12/16/2019 underwent mammography, the results of which I do not have today.  She proceeded to left breast ultrasound showing breast density B.  There was a mass in the upper inner quadrant of the left breast at the 10 o'clock position 7 cm from the nipple.  It measured 3.26 cm.  There are no ultrasound comments regarding the axilla.  Biopsy of the mass in  question was performed 01/15/2020 at [place] in [facility] and the pathology report describes a biphasic tumor with malignant epithelial component and a mesenchymal component.  The epithelial component appears to squamoid and stain strongly for CK 8/18 by immunohistochemistry.  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  The tumor was essentially triple negative, estrogen receptor 0%, progesterone receptor 1% with 1+ intensity, HER-2 1+ by immunohistochemistry, Ki-67 12%.  (The 1% progesterone receptor positivity is best read as an artifact as the progesterone receptor does not function in the absence of a functional estrogen receptor).  The patient's subsequent history is as detailed below   PAST MEDICAL HISTORY: Past Medical History:  Diagnosis Date  . Arthritis    oa  . History of kidney stones   . Hypertension     PAST SURGICAL HISTORY: Past Surgical History:  Procedure Laterality Date  . CYSTOSCOPY  yrs ago  . IR IMAGING GUIDED PORT INSERTION  02/16/2020  . TOTAL KNEE ARTHROPLASTY Left 06/05/2016   Procedure: LEFT TOTAL KNEE ARTHROPLASTY;  Surgeon: [name], MD;  Location: [facility];  Service: Orthopedics;  Laterality: Left;    FAMILY HISTORY No family history on file.  The patient has no information on her father.  Her mother died at the age of 66.  She had 1 sister and 2 brothers.  No one on that side of the family is known to have had cancer.  The patient herself had 5/2 siblings, 2 sisters and 3  brothers, none with a history of cancer.   GYNECOLOGIC HISTORY:  No LMP recorded. Patient is postmenopausal. Menarche: 74 years old Age at first live birth: 74 years old [place] P 2 LMP mid 28s Contraceptive no HRT no  Hysterectomy?  No Salpingo-oophorectomy?  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  She is divorced and her former husband  subsequently died.  She lives by herself with no pets.  Her son [name] 15 is a Production manager and travels a great deal as a Optometrist.  Daughter [name] teaches middle school in [place].  The patient has 4 grandchildren, no great-grandchildren.  She is a Psychologist, forensic.    ADVANCED DIRECTIVES: Not in place   HEALTH MAINTENANCE: Social History   Tobacco Use  . Smoking status: Never Smoker  . Smokeless tobacco: Never Used  Substance Use Topics  . Alcohol use: No  . Drug use: No     Colonoscopy: Never  PAP: Remote  Bone density: Remote   Allergies  Allergen Reactions  . Benazepril Swelling    Patient had severe angioedema on 10/05/2015.    Current Outpatient Medications  Medication Sig Dispense Refill  . amLODipine (NORVASC) 10 MG tablet Take 10 mg by mouth daily after lunch.     Marland Kitchen aspirin 81 MG chewable tablet Chew 1 tablet (81 mg total) by mouth 2 (two) times daily. Take for 4 weeks. 60 tablet 0  . chlorthalidone (HYGROTON) 25 MG tablet Take 25 mg by mouth daily after lunch.     . Cholecalciferol (VITAMIN D) 2000 units CAPS Take 2,000 Units by mouth daily after lunch.    . dexamethasone (DECADRON) 4 MG tablet Take 2 tablets by mouth daily starting the day after Carboplatin and Cytoxan x 3 days. Take with food. 30 tablet 1  . lidocaine-prilocaine (EMLA) cream Apply to affected area once 30 g 3  . losartan (COZAAR) 50 MG tablet Take 50 mg by mouth daily after lunch.    . Multiple Vitamins-Minerals (CENTRUM ADULTS) TABS 1 tab(s)    . potassium chloride (MICRO-K) 10 MEQ CR capsule Take 10 mEq by mouth 2 (two) times daily.    . prochlorperazine (COMPAZINE) 10 MG tablet Take 1 tablet (10 mg total) by mouth every 6 (six) hours as needed (Nausea or vomiting). 30 tablet 1   No current facility-administered medications for this visit.    OBJECTIVE: African-American woman in no acute distress Vitals:   02/29/20 1058  BP: (!) 160/82  Pulse: 99  Resp: 18  Temp: 97.6 F (36.4 C)   SpO2: 99%     Body mass index is 27.85 kg/m.   Wt Readings from Last 3 Encounters:  02/29/20 157 lb 3.2 oz (71.3 kg)  02/23/20 158 lb (71.7 kg)  02/16/20 156 lb (70.8 kg)      ECOG FS:1 - Symptomatic but completely ambulatory  Sclerae unicteric, EOMs intact Wearing a mask No cervical or supraclavicular adenopathy Lungs no rales or rhonchi Heart regular rate and rhythm Abd soft, nontender, positive bowel sounds MSK no focal spinal tenderness, no upper extremity lymphedema Neuro: nonfocal, well oriented, appropriate affect Breasts: The right breast is benign.  The left breast has a palpable mass inferiorly and a visible and palpable mass in the upper portion.  They do not appear appreciably changed.  There is no skin involvement.  Left breast 02/04/2020    LAB RESULTS:  CMP     Component Value Date/Time  NA 134 (L) 02/29/2020 1038   K 3.0 (LL) 02/29/2020 1038   CL 98 02/29/2020 1038   CO2 26 02/29/2020 1038   GLUCOSE 143 (H) 02/29/2020 1038   BUN 21 02/29/2020 1038   CREATININE 0.90 02/29/2020 1038   CALCIUM 10.7 (H) 02/29/2020 1038   PROT 7.3 02/29/2020 1038   ALBUMIN 3.5 02/29/2020 1038   AST 11 (L) 02/29/2020 1038   ALT 14 02/29/2020 1038   ALKPHOS 123 02/29/2020 1038   BILITOT 0.6 02/29/2020 1038   GFRNONAA >60 02/29/2020 1038   GFRAA >60 02/29/2020 1038    No results found for: TOTALPROTELP, ALBUMINELP, A1GS, A2GS, BETS, BETA2SER, GAMS, MSPIKE, SPEI  No results found for: Nils Pyle, [facility]  Lab Results  Component Value Date   WBC 8.5 02/29/2020   NEUTROABS PENDING 02/29/2020   HGB 13.8 02/29/2020   HCT 42.9 02/29/2020   MCV 84.6 02/29/2020   PLT 243 02/29/2020      Chemistry      Component Value Date/Time   NA 134 (L) 02/29/2020 1038   K 3.0 (LL) 02/29/2020 1038   CL 98 02/29/2020 1038   CO2 26 02/29/2020 1038   BUN 21 02/29/2020 1038   CREATININE 0.90 02/29/2020 1038      Component Value Date/Time   CALCIUM 10.7 (H)  02/29/2020 1038   ALKPHOS 123 02/29/2020 1038   AST 11 (L) 02/29/2020 1038   ALT 14 02/29/2020 1038   BILITOT 0.6 02/29/2020 1038       No results found for: LABCA2  No components found for: LDJTTS177  No results for input(s): INR in the last 168 hours.  No results found for: LABCA2  No results found for: LTJ030  No results found for: SPQ330  No results found for: QTM226  No results found for: CA2729  No components found for: HGQUANT  No results found for: CEA1 / No results found for: CEA1   No results found for: AFPTUMOR  No results found for: CHROMOGRNA  No results found for: HGBA, HGBA2QUANT, HGBFQUANT, HGBSQUAN (Hemoglobinopathy evaluation)   No results found for: LDH  No results found for: IRON, TIBC, IRONPCTSAT (Iron and TIBC)  No results found for: FERRITIN  Urinalysis No results found for: COLORURINE, APPEARANCEUR, LABSPEC, PHURINE, GLUCOSEU, HGBUR, BILIRUBINUR, KETONESUR, PROTEINUR, UROBILINOGEN, NITRITE, LEUKOCYTESUR   STUDIES: CT Chest W Contrast  Result Date: 02/12/2020 CLINICAL DATA:  Breast cancer. EXAM: CT CHEST WITH CONTRAST TECHNIQUE: Multidetector CT imaging of the chest was performed during intravenous contrast administration. CONTRAST:  41mL OMNIPAQUE IOHEXOL 300 MG/ML  SOLN COMPARISON:  None. FINDINGS: Cardiovascular: Atherosclerotic calcification of the aorta and coronary arteries. Heart is mildly enlarged. No pericardial effusion. Mediastinum/Nodes: No pathologically enlarged mediastinal, hilar or internal mammary lymph nodes. Left axillary lymph nodes are not enlarged by CT size criteria, measuring up to 7 mm. Esophagus is grossly unremarkable. Lungs/Pleura: No suspicious pulmonary nodules. Minimal scarring in the right lower lobe. Lungs are otherwise clear. No pleural fluid. Airway is unremarkable. Upper Abdomen: Visualized portions of the liver, gallbladder and adrenal glands are unremarkable. Large cystic structures in the right kidney with  marked cortical thinning and areas of calcification, incompletely imaged. Visualized portions of the left kidney, spleen, pancreas, stomach and bowel are otherwise unremarkable. No upper abdominal adenopathy. Musculoskeletal: Degenerative changes in the spine. No worrisome lytic or sclerotic lesions. Irregular mass in the medial left breast measures 3.2 x 3.9 cm. IMPRESSION: 1. Left breast mass and indeterminate subcentimeter left axillary lymph node. Otherwise, no evidence of  metastatic disease. 2. Cystic changes in the right kidney with associated renal cortical thinning and calcification, findings which can be seen with chronic UPJ obstruction. However, full assessment is limited due to incomplete visualization and lack of delayed imaging. If further evaluation is desired, dedicated CT abdomen pelvis with contrast is suggested. 3. Aortic atherosclerosis (ICD10-I70.0). Coronary artery calcification. Electronically Signed   By: Lorin Picket M.D.   On: 02/12/2020 08:12   NM Bone Scan Whole Body  Result Date: 02/12/2020 CLINICAL DATA:  Breast cancer, staging examination EXAM: NUCLEAR MEDICINE WHOLE BODY BONE SCAN TECHNIQUE: Whole body anterior and posterior images were obtained approximately 3 hours after intravenous injection of radiopharmaceutical. RADIOPHARMACEUTICALS:  21.1 mCi Technetium-37m MDP IV COMPARISON:  None. FINDINGS: There is focal uptake of radiotracer within the anterior left chest likely representing uptake of tracer within the primary lesion seen on CT examination performed concurrently. Uptake of tracer within the mandible likely relates to periodontal disease. Defect within the left knee likely relates to left total knee arthroplasty with stress reaction within the subjacent tibia. Uptake within the left shoulder and right knee is likely degenerative in nature. There is uptake and retention within the right retroperitoneum corresponding to thesevere hydronephrosis involving the visualized  right kidney on accompanying CT examination. Normal uptake and excretion on the left into the bladder. IMPRESSION: Soft tissue uptake within the primary lesion within the left breast. No evidence of osseous metastatic disease. 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 (The patient's prior imaging was performed at [facility] in [place], [employer] and the biopsy was performed at [facility] in [place], [place]). LABS:  Not applicable. EXAM: BILATERAL BREAST MRI WITH AND WITHOUT CONTRAST TECHNIQUE: Multiplanar, multisequence MR images of both breasts were obtained prior to and following the intravenous administration of 7 ml of Gadavist. Three-dimensional MR images were rendered by post-processing of the original MR data on an independent workstation. The three-dimensional MR images were interpreted, and findings are reported in the following complete MRI report for this study. Three dimensional images were evaluated at the independent interpreting workstation using the DynaCAD thin client. COMPARISON:  No prior MRI. Outside mammography and LEFT breast ultrasound is correlated. FINDINGS: Breast composition: b. Scattered fibroglandular tissue. Background parenchymal enhancement: Mild. Right breast: No mass or abnormal enhancement. Left breast: Enhancing heterogeneous irregular mass involving the LOWER INNER QUADRANT measuring approximately 4.4 x 4.0 x 3.9 cm (transverse x AP x craniocaudal), extending to the skin surface of the INNER breast, corresponding to the biopsy-proven malignancy. No mass or abnormal enhancement elsewhere. Lymph nodes: No pathologic lymphadenopathy. Ancillary findings:  None. IMPRESSION: 1.  Biopsy-proven malignancy involving the LOWER INNER QUADRANT of the LEFT breast measuring approximately 4.4 cm maximally. The mass extends to the skin surface of the INNER breast. 2. No MRI evidence of malignancy involving the RIGHT breast. 3. No pathologic lymphadenopathy. RECOMMENDATION: Treatment plan for the known LEFT breast malignancy. BI-RADS CATEGORY  6: Known biopsy-proven malignancy. Electronically Signed   By: Evangeline Dakin M.D.   On: 02/11/2020 15:59   ECHOCARDIOGRAM COMPLETE  Result Date: 02/12/2020    ECHOCARDIOGRAM REPORT   Patient Name:   TYANNA HACH Date of Exam: 02/12/2020 Medical Rec #:  [id number]  Height:       63.5 in Accession #:    [id number]       Weight:       157.1 lb Date of Birth:  Aug 06, 1945        BSA:          1.755 m Patient Age:    89 years         BP:           162/84 mmHg Patient Gender: F                HR:           79 bpm. Exam Location:  Outpatient Procedure: 2D Echo Indications:    chemo eval  History:        Patient has no prior history of Echocardiogram examinations.                 Risk Factors:Hypertension. Cancer.  Sonographer:    Jannett Celestine RDCS (AE) Referring Phys: 68 Sarim Rothman Everett Graff  Sonographer Comments: challenging apical windows IMPRESSIONS  1. Small LV cavity with prominent papillary muscles mid/apical cavity obliteration in systole Normal GLS -18.4 . Left ventricular ejection fraction, by estimation, is 60 to 65%. The left ventricle has normal function. The left ventricle has no regional wall motion abnormalities. There is mild left ventricular hypertrophy. Left ventricular diastolic parameters were normal.  2. Right ventricular systolic function is normal. The right ventricular size is normal.  3. Left atrial size was mildly dilated.  4. The mitral valve is normal in structure. No evidence of mitral valve regurgitation. No evidence of mitral stenosis.  5. The aortic valve is tricuspid. Aortic valve regurgitation is not visualized. Mild to  moderate aortic valve sclerosis/calcification is present, without any evidence of aortic stenosis.  6. The inferior vena cava is normal in size with greater than 50% respiratory variability, suggesting right atrial pressure of 3 mmHg. FINDINGS  Left Ventricle: Small LV cavity with prominent papillary muscles mid/apical cavity obliteration in systole Normal GLS -18.4. Left ventricular ejection fraction, by estimation, is 60 to 65%. The left ventricle has normal function. The left ventricle has no regional wall motion abnormalities. The left ventricular internal cavity size was small. There is mild left ventricular hypertrophy. Left ventricular diastolic parameters were normal. Right Ventricle: The right ventricular size is normal. No increase in right ventricular wall thickness. Right ventricular systolic function is normal. Left Atrium: Left atrial size was mildly dilated. Right Atrium: Right atrial size was normal in size. Pericardium: There is no evidence of pericardial effusion. Mitral Valve: The mitral valve is normal in structure. Normal mobility of the mitral valve leaflets. No evidence of mitral valve regurgitation. No evidence of mitral valve stenosis. Tricuspid Valve: The tricuspid valve is normal in structure. Tricuspid valve regurgitation is mild . No evidence of tricuspid stenosis. Aortic Valve: The aortic valve is tricuspid. Aortic valve regurgitation is not visualized. Mild to moderate aortic valve sclerosis/calcification is present, without any evidence of aortic stenosis. Pulmonic Valve: The pulmonic valve was normal in structure. Pulmonic valve regurgitation is not visualized. No evidence of pulmonic stenosis. Aorta: The aortic root is normal in size and structure. Venous: The inferior vena cava is normal in size with greater than 50% respiratory variability, suggesting right atrial pressure of 3 mmHg. IAS/Shunts: The interatrial septum was not well visualized.  LEFT VENTRICLE PLAX 2D LVIDd:          3.30 cm  Diastology LVIDs:  2.20 cm  LV e' lateral:   9.46 cm/s LV PW:         1.10 cm  LV E/e' lateral: 5.8 LV IVS:        1.20 cm  LV e' medial:    7.18 cm/s LVOT diam:     2.00 cm  LV E/e' medial:  7.6 LV SV:         99 LV SV Index:   57 LVOT Area:     3.14 cm  LEFT ATRIUM             Index       RIGHT ATRIUM           Index LA diam:        3.80 cm 2.16 cm/m  RA Area:     10.30 cm LA Vol (A2C):   31.0 ml 17.66 ml/m RA Volume:   16.50 ml  9.40 ml/m LA Vol (A4C):   37.5 ml 21.36 ml/m LA Biplane Vol: 35.9 ml 20.45 ml/m  AORTIC VALVE LVOT Vmax:   147.00 cm/s LVOT Vmean:  114.000 cm/s LVOT VTI:    0.316 m  AORTA Ao Root diam: 2.60 cm MITRAL VALVE MV Area (PHT): 2.54 cm    SHUNTS MV Decel Time: 299 msec    Systemic VTI:  0.32 m MV E velocity: 54.40 cm/s  Systemic Diam: 2.00 cm MV A velocity: 76.30 cm/s MV E/A ratio:  0.71 Jenkins Rouge MD Electronically signed by Jenkins Rouge MD Signature Date/Time: 02/12/2020/12:47:59 PM    Final    IR IMAGING GUIDED PORT INSERTION  Result Date: 02/16/2020 INDICATION: 74 year old female referred for port catheter placement EXAM: IMAGE GUIDED PORT CATHETER PLACEMENT MEDICATIONS: 2 g Ancef; The antibiotic was administered within an appropriate time interval prior to skin puncture. ANESTHESIA/SEDATION: Moderate (conscious) sedation was employed during this procedure. A total of Versed 1.0 mg and Fentanyl 50 mcg was administered intravenously. Moderate Sedation Time: 24 minutes. The patient's level of consciousness and vital signs were monitored continuously by radiology nursing throughout the procedure under my direct supervision. FLUOROSCOPY TIME:  Fluoroscopy Time: 0 minutes 6 seconds (0 mGy). COMPLICATIONS: None PROCEDURE: The procedure, risks, benefits, and alternatives were explained to the patient. Questions regarding the procedure were encouraged and answered. The patient understands and consents to the procedure. Ultrasound survey was performed with images stored  and sent to PACs. The right neck and chest was prepped with chlorhexidine, and draped in the usual sterile fashion using maximum barrier technique (cap and mask, sterile gown, sterile gloves, large sterile sheet, hand hygiene and cutaneous antiseptic). Antibiotic prophylaxis was provided with 2.0g Ancef administered IV one hour prior to skin incision. Local anesthesia was attained by infiltration with 1% lidocaine without epinephrine. Ultrasound demonstrated patency of the right internal jugular vein, and this was documented with an image. Under real-time ultrasound guidance, this vein was accessed with a 21 gauge micropuncture needle and image documentation was performed. A small dermatotomy was made at the access site with an 11 scalpel. A 0.018" wire was advanced into the SVC and used to estimate the length of the internal catheter. The access needle exchanged for a 25F micropuncture vascular sheath. The 0.018" wire was then removed and a 0.035" wire advanced into the IVC. An appropriate location for the subcutaneous reservoir was selected below the clavicle and an incision was made through the skin and underlying soft tissues. The subcutaneous tissues were then dissected using a combination of blunt and sharp surgical technique  and a pocket was formed. A single lumen power injectable portacatheter was then tunneled through the subcutaneous tissues from the pocket to the dermatotomy and the port reservoir placed within the subcutaneous pocket. The venous access site was then serially dilated and a peel away vascular sheath placed over the wire. The wire was removed and the port catheter advanced into position under fluoroscopic guidance. The catheter tip is positioned in the cavoatrial junction. This was documented with a spot image. The portacatheter was then tested and found to flush and aspirate well. The port was flushed with saline followed by 100 units/mL heparinized saline. The pocket was then closed in two  layers using first subdermal inverted interrupted absorbable sutures followed by a running subcuticular suture. The epidermis was then sealed with Dermabond. The dermatotomy at the venous access site was also seal with Dermabond. Patient tolerated the procedure well and remained hemodynamically stable throughout. No complications encountered and no significant blood loss encountered IMPRESSION: Status post right IJ port catheter placement. Signed, Dulcy Fanny. Dellia Nims, RPVI [facility] Electronically Signed   By: Corrie Mckusick D.O.   On: 02/16/2020 10:22    ELIGIBLE FOR AVAILABLE RESEARCH PROTOCOL: no  ASSESSMENT: 74 y.o. [place] woman status post left breast upper inner quadrant biopsy 01/15/2020 for a clinical T2 NX, stage IIB anaplastic carcinoma, triple negative, with an MIB-1 of 12%  (a) bone scan and CT chest 02/12/2020 show no evidence of metastatic disease  (1) neoadjuvant chemotherapy with doxorubicin and cyclophosphamide in dose dense fashion x4 started 02/23/2020, to be followed by weekly paclitaxel and carboplatin x12  (a) echo 0903 shows an ejection fraction in the 60-65% range  (2) definitive surgery to follow  (3) adjuvant radiation as appropriate   PLAN: Constantina did remarkably well with her first cycle of chemotherapy and her counts today are terrific.  She will not need intervening antibiotics.  She did get a mild headache from the nausea medication.  She wanted to use BC powders which she tends to overuse according to her family.  I suggested she give Aleve a try.  This is usually only an issue on day 1.  Since she had no nausea whatsoever we are dropping the dexamethasone to 4 mg instead of 8 mg doses.  I am making no other changes at this point but if she has no nausea with the second cycle I am going to drop the Compazine to 5 mg from 10.  This is likely what is making her woozy.  She understands she  will be losing her hair in the next 7 to 10 days.  She is due for her repeat vaccine dose and I urged her to go ahead and receive it.  She will eventually receive a booster.  I am going to see her with each subsequent cycle.  She knows to call for any other problems that may develop before the next visit  Total encounter time 35 minutes.Chauncey Cruel, MD   02/29/2020 11:29 AM Medical Oncology and Hematology [facility] [place], [place] [address] Tel. [phone]    Fax. [phone]   I, Wilburn Mylar, am acting as scribe for Dr. [name]. Donika Butner.  I, Lurline Del MD, have reviewed the above documentation for accuracy and completeness, and I agree with the above.    *Total Encounter Time as defined by the [employer] and Medicaid Services includes, in addition to the  face-to-face time of a patient visit (documented in the note above) non-face-to-face time: obtaining and reviewing outside history, ordering and reviewing medications, tests or procedures, care coordination (communications with other health care professionals or caregivers) and documentation in the medical record.

## 2020-02-29 ENCOUNTER — Telehealth: Payer: Self-pay | Admitting: Oncology

## 2020-02-29 ENCOUNTER — Other Ambulatory Visit: Payer: Self-pay

## 2020-02-29 ENCOUNTER — Encounter: Payer: Self-pay | Admitting: *Deleted

## 2020-02-29 ENCOUNTER — Telehealth: Payer: Self-pay

## 2020-02-29 ENCOUNTER — Other Ambulatory Visit: Payer: Self-pay | Admitting: Oncology

## 2020-02-29 ENCOUNTER — Inpatient Hospital Stay (HOSPITAL_BASED_OUTPATIENT_CLINIC_OR_DEPARTMENT_OTHER): Payer: Medicare PPO | Admitting: Oncology

## 2020-02-29 ENCOUNTER — Inpatient Hospital Stay: Payer: Medicare PPO

## 2020-02-29 VITALS — BP 160/82 | HR 99 | Temp 97.6°F | Resp 18 | Ht 63.0 in | Wt 157.2 lb

## 2020-02-29 DIAGNOSIS — Z171 Estrogen receptor negative status [ER-]: Secondary | ICD-10-CM

## 2020-02-29 DIAGNOSIS — C50919 Malignant neoplasm of unspecified site of unspecified female breast: Secondary | ICD-10-CM

## 2020-02-29 DIAGNOSIS — C50212 Malignant neoplasm of upper-inner quadrant of left female breast: Secondary | ICD-10-CM | POA: Diagnosis not present

## 2020-02-29 DIAGNOSIS — Z5111 Encounter for antineoplastic chemotherapy: Secondary | ICD-10-CM | POA: Diagnosis not present

## 2020-02-29 LAB — CBC WITH DIFFERENTIAL (CANCER CENTER ONLY)
Abs Immature Granulocytes: 0.7 10*3/uL — ABNORMAL HIGH (ref 0.00–0.07)
Basophils Absolute: 0 10*3/uL (ref 0.0–0.1)
Basophils Relative: 0 %
Eosinophils Absolute: 0 10*3/uL (ref 0.0–0.5)
Eosinophils Relative: 0 %
HCT: 42.9 % (ref 36.0–46.0)
Hemoglobin: 13.8 g/dL (ref 12.0–15.0)
Immature Granulocytes: 8 %
Lymphocytes Relative: 17 %
Lymphs Abs: 1.5 10*3/uL (ref 0.7–4.0)
MCH: 27.2 pg (ref 26.0–34.0)
MCHC: 32.2 g/dL (ref 30.0–36.0)
MCV: 84.6 fL (ref 80.0–100.0)
Monocytes Absolute: 0.2 10*3/uL (ref 0.1–1.0)
Monocytes Relative: 2 %
Neutro Abs: 6.1 10*3/uL (ref 1.7–7.7)
Neutrophils Relative %: 73 %
Platelet Count: 243 10*3/uL (ref 150–400)
RBC: 5.07 MIL/uL (ref 3.87–5.11)
RDW: 14.4 % (ref 11.5–15.5)
WBC Count: 8.5 10*3/uL (ref 4.0–10.5)
nRBC: 0 % (ref 0.0–0.2)

## 2020-02-29 LAB — CMP (CANCER CENTER ONLY)
ALT: 14 U/L (ref 0–44)
AST: 11 U/L — ABNORMAL LOW (ref 15–41)
Albumin: 3.5 g/dL (ref 3.5–5.0)
Alkaline Phosphatase: 123 U/L (ref 38–126)
Anion gap: 10 (ref 5–15)
BUN: 21 mg/dL (ref 8–23)
CO2: 26 mmol/L (ref 22–32)
Calcium: 10.7 mg/dL — ABNORMAL HIGH (ref 8.9–10.3)
Chloride: 98 mmol/L (ref 98–111)
Creatinine: 0.9 mg/dL (ref 0.44–1.00)
GFR, Est AFR Am: >60 mL/min (ref >60)
GFR, Estimated: >60 mL/min (ref >60)
Glucose, Bld: 143 mg/dL — ABNORMAL HIGH (ref 70–99)
Potassium: 3 mmol/L — CL (ref 3.5–5.1)
Sodium: 134 mmol/L — ABNORMAL LOW (ref 135–145)
Total Bilirubin: 0.6 mg/dL (ref 0.3–1.2)
Total Protein: 7.3 g/dL (ref 6.5–8.1)

## 2020-02-29 MED ORDER — LORATADINE 10 MG PO TABS: 10.0000 mg | ORAL_TABLET | Freq: Every day | ORAL | Status: DC

## 2020-02-29 MED ORDER — LORATADINE 10 MG PO TABS: 10.0000 mg | ORAL_TABLET | Freq: Every day | ORAL | 3 refills | Status: DC

## 2020-02-29 NOTE — Telephone Encounter (Signed)
Spoke with pt's daughter, [name] about pt's K+, informing of level 3.0 and per Dr Jana Hakim, advised that if pt is taking as directed, she needs to increase to 40 mEq daily X5 days, then resume to 20 mEq dailybut if she is not taking it, she needs to take it as directed. Daughter states she arranges all medications for her mother, but is unsure if she is taking them the way she is supposed to, but will ask. Daughter verbalized instructions with teachback. Daughter also requested Rx for Claritin be sent to pharmacy as it is cheaper as a Rx rather than OTC. Informed daughter this would be completed.

## 2020-02-29 NOTE — Telephone Encounter (Signed)
Scheduled appts per 9/20 los. Gave pt a print out of AVS.  

## 2020-03-01 ENCOUNTER — Encounter: Payer: Self-pay | Admitting: Licensed Clinical Social Worker

## 2020-03-01 NOTE — Progress Notes (Signed)
[facility] Initial Psychosocial Assessment Clinical Social Work  Clinical Social Work contacted by [facility] to assess psychosocial and financial needs of the patient.  CSW spoke with pt's daughter, [name], by phone.  Barriers to care/review of distress screen:  - Transportation:  No needs. Living with daughter who can help - Help at home:  Was living by self in [place], currently living with daughter, [name], who can provide support  - Finances:    o Employment status: retired Loss adjuster, chartered source of income: [place] o Concerns with finances due to medical bills & limited income   Identifications of barriers to care: Radiation protection practitioner of community resources: yes, some breast cancer foundations. E-mailed information to daughter for [facility]ene[facility] Ramus in [place], [facility], [facility], and [employer]. Patient also applying for [place] Worker follow up needed: CSW will assist with submitting final applications as family completes their part & gathers their supporting documents.    Edwinna Areola Marquia Costello, LCSW

## 2020-03-07 ENCOUNTER — Other Ambulatory Visit: Payer: Self-pay | Admitting: *Deleted

## 2020-03-07 DIAGNOSIS — Z171 Estrogen receptor negative status [ER-]: Secondary | ICD-10-CM

## 2020-03-07 NOTE — Progress Notes (Signed)
[place]  [phone] [phone]     ID: Donna [name] DOB: [birth year]  MR#: [id number]  LPN#:[id number]  Patient Care Team: Donna Morita, PA-C as PCP - General (Hematology and Oncology) Donna [name], Donna Dad, MD as Consulting Physician (Oncology) Donna Kussmaul, MD as Consulting Physician (General Surgery) Donna Cancel, MD as Consulting Physician (Orthopedic Surgery) Donna Kaufmann, RN as Oncology Nurse Navigator Donna Germany, RN as Oncology Nurse Navigator Donna Cruel, MD OTHER MD: Donna Lean MD ([place]); Donna Bolster NP  CHIEF COMPLAINT: Triple negative breast cancer  CURRENT TREATMENT: neoadjuvant chemotherapy   INTERVAL HISTORY: Donna [name] returns today for follow up and treatment of her triple negative breast cancer accompanied by her son Donna [name].   She began neoadjuvant chemotherapy, consisting of cyclophosphamide and doxorubicin with pegfilgrastim on day 3, on 02/23/2020. Today is day 1 cycle 2.   REVIEW OF SYSTEMS: Donna [name] continues to do remarkably well through these intensive treatments.  Yesterday she did some cocaine.  She walks around the house (she does not know the neighborhood here".  She did not feel "great great "which is the way she usually feels, but she felt "okay".  She has only now begun to lose some of her hair.  A detailed review of systems today was otherwise stable   HISTORY OF CURRENT ILLNESS: From the original intake note:  [name] herself palpated a mass in her left breast during showering.  She brought this to medical attention and on 12/16/2019 underwent mammography, the results of which I do not have today.  She proceeded to left breast ultrasound showing breast density B.  There was a mass in the upper inner quadrant of the left breast at the 10 o'clock position 7 cm from the nipple.  It measured 3.26 cm.  There are no ultrasound comments regarding the axilla.  Biopsy of the mass in question was  performed 01/15/2020 at [facility] in [facility] and the pathology report describes a biphasic tumor with malignant epithelial component and a mesenchymal component.  The epithelial component appears to squamoid and stain strongly for CK 8/18 by immunohistochemistry.  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  The tumor was essentially triple negative, estrogen receptor 0%, progesterone receptor 1% with 1+ intensity, HER-2 1+ by immunohistochemistry, Ki-67 12%.  (The 1% progesterone receptor positivity is best read as an artifact as the progesterone receptor does not function in the absence of a functional estrogen receptor).  The patient's subsequent history is as detailed below   PAST MEDICAL HISTORY: Past Medical History:  Diagnosis Date  . Arthritis    oa  . History of kidney stones   . Hypertension     PAST SURGICAL HISTORY: Past Surgical History:  Procedure Laterality Date  . CYSTOSCOPY  yrs ago  . IR IMAGING GUIDED PORT INSERTION  02/16/2020  . TOTAL KNEE ARTHROPLASTY Left 06/05/2016   Procedure: LEFT TOTAL KNEE ARTHROPLASTY;  Surgeon: Donna Cancel, MD;  Location: [facility];  Service: Orthopedics;  Laterality: Left;    FAMILY HISTORY No family history on file.  The patient has no information on her father.  Her mother died at the age of 71.  She had 1 sister and 2 brothers.  No one on that side of the family is known to have had cancer.  The patient herself had 5/2 siblings, 2 sisters and 3 brothers, none with a history of cancer.  GYNECOLOGIC HISTORY:  No LMP recorded. Patient is postmenopausal. Menarche: 74 years old Age at first live birth: 74 years old [name] 2 LMP mid 65s Contraceptive no HRT no  Hysterectomy?  No Salpingo-oophorectomy?  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  She is divorced and her former husband subsequently  died.  She lives by herself with no pets.  Her son Donna [name] 71 is a Production manager and travels a great deal as a Optometrist.  Daughter Donna [name] teaches middle school in [place].  The patient has 4 grandchildren, no great-grandchildren.  She is a Psychologist, forensic.    ADVANCED DIRECTIVES: Not in place   HEALTH MAINTENANCE: Social History   Tobacco Use  . Smoking status: Never Smoker  . Smokeless tobacco: Never Used  Substance Use Topics  . Alcohol use: No  . Drug use: No     Colonoscopy: Never  PAP: Remote  Bone density: Remote   Allergies  Allergen Reactions  . Benazepril Swelling    Patient had severe angioedema on 10/05/2015.    Current Outpatient Medications  Medication Sig Dispense Refill  . amLODipine (NORVASC) 10 MG tablet Take 10 mg by mouth daily after lunch.     Marland Kitchen aspirin 81 MG chewable tablet Chew 1 tablet (81 mg total) by mouth 2 (two) times daily. Take for 4 weeks. 60 tablet 0  . chlorthalidone (HYGROTON) 25 MG tablet Take 25 mg by mouth daily after lunch.     . Cholecalciferol (VITAMIN D) 2000 units CAPS Take 2,000 Units by mouth daily after lunch.    . dexamethasone (DECADRON) 4 MG tablet Take 2 tablets by mouth daily starting the day after Carboplatin and Cytoxan x 3 days. Take with food. 30 tablet 1  . lidocaine-prilocaine (EMLA) cream Apply to affected area once 30 g 3  . loratadine (CLARITIN) 10 MG tablet Take 1 tablet (10 mg total) by mouth daily. Starting the day after chemotherapy for 10 days, then as needed. 30 tablet 3  . losartan (COZAAR) 50 MG tablet Take 50 mg by mouth daily after lunch.    . Multiple Vitamins-Minerals (CENTRUM ADULTS) TABS 1 tab(s)    . potassium chloride (MICRO-K) 10 MEQ CR capsule Take 10 mEq by mouth 2 (two) times daily.    . prochlorperazine (COMPAZINE) 10 MG tablet Take 1 tablet (10 mg total) by mouth every 6 (six) hours as needed (Nausea or vomiting). 30 tablet 1   No current facility-administered medications for this visit.     OBJECTIVE: African-American woman who appears stated age 74:   03/08/20 0925  BP: (!) 141/74  Pulse: 93  Resp: 15  Temp: (!) 96.7 F (35.9 C)  SpO2: 97%     Body mass index is 27.83 kg/m.   Wt Readings from Last 3 Encounters:  03/08/20 157 lb 1.6 oz (71.3 kg)  02/29/20 157 lb 3.2 oz (71.3 kg)  02/23/20 158 lb (71.7 kg)      ECOG FS:1 - Symptomatic but completely ambulatory  Sclerae unicteric, EOMs intact Wearing a mask No cervical or supraclavicular adenopathy Lungs no rales or rhonchi Heart regular rate and rhythm Abd soft, nontender, positive bowel sounds MSK no focal spinal tenderness, no upper extremity lymphedema Neuro: nonfocal, well oriented, appropriate affect Breasts: The right breast is benign.  The mass in the anterior superior aspect of the left breast may be slightly smaller and slightly softer, but this is not very marked at  this point.  Both axillae are benign   Left breast 02/04/2020    LAB RESULTS:  CMP     Component Value Date/Time   NA 134 (L) 02/29/2020 1038   K 3.0 (LL) 02/29/2020 1038   CL 98 02/29/2020 1038   CO2 26 02/29/2020 1038   GLUCOSE 143 (H) 02/29/2020 1038   BUN 21 02/29/2020 1038   CREATININE 0.90 02/29/2020 1038   CALCIUM 10.7 (H) 02/29/2020 1038   PROT 7.3 02/29/2020 1038   ALBUMIN 3.5 02/29/2020 1038   AST 11 (L) 02/29/2020 1038   ALT 14 02/29/2020 1038   ALKPHOS 123 02/29/2020 1038   BILITOT 0.6 02/29/2020 1038   GFRNONAA >60 02/29/2020 1038   GFRAA >60 02/29/2020 1038    No results found for: TOTALPROTELP, ALBUMINELP, A1GS, A2GS, BETS, BETA2SER, GAMS, MSPIKE, SPEI  No results found for: KPAFRELGTCHN, LAMBDASER, KAPLAMBRATIO  Lab Results  Component Value Date   WBC 11.1 (H) 03/08/2020   NEUTROABS PENDING 03/08/2020   HGB 13.3 03/08/2020   HCT 41.2 03/08/2020   MCV 86.0 03/08/2020   PLT 209 03/08/2020      Chemistry      Component Value Date/Time   NA 134 (L) 02/29/2020 1038   K 3.0 (LL)  02/29/2020 1038   CL 98 02/29/2020 1038   CO2 26 02/29/2020 1038   BUN 21 02/29/2020 1038   CREATININE 0.90 02/29/2020 1038      Component Value Date/Time   CALCIUM 10.7 (H) 02/29/2020 1038   ALKPHOS 123 02/29/2020 1038   AST 11 (L) 02/29/2020 1038   ALT 14 02/29/2020 1038   BILITOT 0.6 02/29/2020 1038       No results found for: LABCA2  No components found for: ZRAQTM226  No results for input(s): INR in the last 168 hours.  No results found for: LABCA2  No results found for: JFH545  No results found for: GYB638  No results found for: LHT342  No results found for: CA2729  No components found for: HGQUANT  No results found for: CEA1 / No results found for: CEA1   No results found for: AFPTUMOR  No results found for: CHROMOGRNA  No results found for: HGBA, HGBA2QUANT, HGBFQUANT, HGBSQUAN (Hemoglobinopathy evaluation)   No results found for: LDH  No results found for: IRON, TIBC, IRONPCTSAT (Iron and TIBC)  No results found for: FERRITIN  Urinalysis No results found for: COLORURINE, APPEARANCEUR, LABSPEC, PHURINE, GLUCOSEU, HGBUR, BILIRUBINUR, KETONESUR, PROTEINUR, UROBILINOGEN, NITRITE, LEUKOCYTESUR   STUDIES: CT Chest W Contrast  Result Date: 02/12/2020 CLINICAL DATA:  Breast cancer. EXAM: CT CHEST WITH CONTRAST TECHNIQUE: Multidetector CT imaging of the chest was performed during intravenous contrast administration. CONTRAST:  85m OMNIPAQUE IOHEXOL 300 MG/ML  SOLN COMPARISON:  None. FINDINGS: Cardiovascular: Atherosclerotic calcification of the aorta and coronary arteries. Heart is mildly enlarged. No pericardial effusion. Mediastinum/Nodes: No pathologically enlarged mediastinal, hilar or internal mammary lymph nodes. Left axillary lymph nodes are not enlarged by CT size criteria, measuring up to 7 mm. Esophagus is grossly unremarkable. Lungs/Pleura: No suspicious pulmonary nodules. Minimal scarring in the right lower lobe. Lungs are otherwise clear. No  pleural fluid. Airway is unremarkable. Upper Abdomen: Visualized portions of the liver, gallbladder and adrenal glands are unremarkable. Large cystic structures in the right kidney with marked cortical thinning and areas of calcification, incompletely imaged. Visualized portions of the left kidney, spleen, pancreas, stomach and bowel are otherwise unremarkable. No upper abdominal adenopathy. Musculoskeletal: Degenerative changes in the spine. No worrisome lytic or sclerotic  lesions. Irregular mass in the medial left breast measures 3.2 x 3.9 cm. IMPRESSION: 1. Left breast mass and indeterminate subcentimeter left axillary lymph node. Otherwise, no evidence of metastatic disease. 2. Cystic changes in the right kidney with associated renal cortical thinning and calcification, findings which can be seen with chronic UPJ obstruction. However, full assessment is limited due to incomplete visualization and lack of delayed imaging. If further evaluation is desired, dedicated CT abdomen pelvis with contrast is suggested. 3. Aortic atherosclerosis (ICD10-I70.0). Coronary artery calcification. Electronically Signed   By: Lorin Picket M.D.   On: 02/12/2020 08:12   NM Bone Scan Whole Body  Result Date: 02/12/2020 CLINICAL DATA:  Breast cancer, staging examination EXAM: NUCLEAR MEDICINE WHOLE BODY BONE SCAN TECHNIQUE: Whole body anterior and posterior images were obtained approximately 3 hours after intravenous injection of radiopharmaceutical. RADIOPHARMACEUTICALS:  21.1 mCi Technetium-26mMDP IV COMPARISON:  None. FINDINGS: There is focal uptake of radiotracer within the anterior left chest likely representing uptake of tracer within the primary lesion seen on CT examination performed concurrently. Uptake of tracer within the mandible likely relates to periodontal disease. Defect within the left knee likely relates to left total knee arthroplasty with stress reaction within the subjacent tibia. Uptake within the left  shoulder and right knee is likely degenerative in nature. There is uptake and retention within the right retroperitoneum corresponding to thesevere hydronephrosis involving the visualized right kidney on accompanying CT examination. Normal uptake and excretion on the left into the bladder. IMPRESSION: Soft tissue uptake within the primary lesion within the left breast. No evidence of osseous metastatic disease. Severe right hydronephrosis Electronically Signed   By: AFidela SalisburyMD   On: 02/12/2020 20:37   MR BREAST BILATERAL W WO CONTRAST INC CAD  Result Date: 02/11/2020 CLINICAL DATA:  74year old with a biopsy proven metaplastic carcinoma (carcinosarcoma) involving the UPPER INNER QUADRANT of the LEFT breast. ([facility] [place] [facility] [place] LABS:  Not applicable. EXAM: BILATERAL BREAST MRI WITH AND WITHOUT CONTRAST TECHNIQUE: Multiplanar, multisequence MR images of both breasts were obtained prior to and following the intravenous administration of 7 ml of Gadavist. Three-dimensional MR images were rendered by post-processing of the original MR data on an independent workstation. The three-dimensional MR images were interpreted, and findings are reported in the following complete MRI report for this study. Three dimensional images were evaluated at the independent interpreting workstation using the DynaCAD thin client. COMPARISON:  No prior MRI. Outside mammography and LEFT breast ultrasound is correlated. FINDINGS: Breast composition: b. Scattered fibroglandular tissue. Background parenchymal enhancement: Mild. Right breast: No mass or abnormal enhancement. Left breast: Enhancing heterogeneous irregular mass involving the LOWER INNER QUADRANT measuring approximately 4.4 x 4.0 x 3.9 cm (transverse x AP x craniocaudal), extending to the skin surface of the INNER  breast, corresponding to the biopsy-proven malignancy. No mass or abnormal enhancement elsewhere. Lymph nodes: No pathologic lymphadenopathy. Ancillary findings:  None. IMPRESSION: 1. Biopsy-proven malignancy involving the LOWER INNER QUADRANT of the LEFT breast measuring approximately 4.4 cm maximally. The mass extends to the skin surface of the INNER breast. 2. No MRI evidence of malignancy involving the RIGHT breast. 3. No pathologic lymphadenopathy. RECOMMENDATION: Treatment plan for the known LEFT breast malignancy. BI-RADS CATEGORY  6: Known biopsy-proven malignancy. Electronically Signed   By: TEvangeline DakinM.D.   On: 02/11/2020 15:59   ECHOCARDIOGRAM COMPLETE  Result Date: 02/12/2020    ECHOCARDIOGRAM REPORT   Patient Name:   CONCEPTION DOEBLER Date of Exam: 02/12/2020 Medical Rec #:  [id number]        Height:       63.5 in Accession #:    [id number]       Weight:       157.1 lb Date of Birth:  1945-07-22        BSA:          1.755 m Patient Age:    16 years         BP:           162/84 mmHg Patient Gender: F                HR:           79 bpm. Exam Location:  Outpatient Procedure: 2D Echo Indications:    chemo eval  History:        Patient has no prior history of Echocardiogram examinations.                 Risk Factors:Hypertension. Cancer.  Sonographer:    Jannett Celestine RDCS (AE) Referring Phys: [id number] Raffaela Ladley Everett Graff  Sonographer Comments: challenging apical windows IMPRESSIONS  1. Small LV cavity with prominent papillary muscles mid/apical cavity obliteration in systole Normal GLS -18.4 . Left ventricular ejection fraction, by estimation, is 60 to 65%. The left ventricle has normal function. The left ventricle has no regional wall motion abnormalities. There is mild left ventricular hypertrophy. Left ventricular diastolic parameters were normal.  2. Right ventricular systolic function is normal. The right ventricular size is normal.  3. Left atrial size was mildly dilated.  4. The mitral valve is  normal in structure. No evidence of mitral valve regurgitation. No evidence of mitral stenosis.  5. The aortic valve is tricuspid. Aortic valve regurgitation is not visualized. Mild to moderate aortic valve sclerosis/calcification is present, without any evidence of aortic stenosis.  6. The inferior vena cava is normal in size with greater than 50% respiratory variability, suggesting right atrial pressure of 3 mmHg. FINDINGS  Left Ventricle: Small LV cavity with prominent papillary muscles mid/apical cavity obliteration in systole Normal GLS -18.4. Left ventricular ejection fraction, by estimation, is 60 to 65%. The left ventricle has normal function. The left ventricle has no regional wall motion abnormalities. The left ventricular internal cavity size was small. There is mild left ventricular hypertrophy. Left ventricular diastolic parameters were normal. Right Ventricle: The right ventricular size is normal. No increase in right ventricular wall thickness. Right ventricular systolic function is normal. Left Atrium: Left atrial size was mildly dilated. Right Atrium: Right atrial size was normal in size. Pericardium: There is no evidence of pericardial effusion. Mitral Valve: The mitral valve is normal in structure. Normal mobility of the mitral valve leaflets. No evidence of mitral valve regurgitation. No evidence of mitral valve stenosis. Tricuspid Valve: The tricuspid valve is normal in structure. Tricuspid valve regurgitation is mild . No evidence of tricuspid stenosis. Aortic Valve: The aortic valve is tricuspid. Aortic valve regurgitation is not visualized. Mild to moderate aortic valve sclerosis/calcification is present, without any evidence of aortic stenosis. Pulmonic Valve: The pulmonic valve was normal in structure. Pulmonic valve regurgitation is not visualized. No evidence of pulmonic stenosis. Aorta: The aortic root is normal in size and structure. Venous: The inferior vena cava is normal in size with  greater than 50% respiratory variability, suggesting right atrial pressure of  3 mmHg. IAS/Shunts: The interatrial septum was not well visualized.  LEFT VENTRICLE PLAX 2D LVIDd:         3.30 cm  Diastology LVIDs:         2.20 cm  LV e' lateral:   9.46 cm/s LV PW:         1.10 cm  LV E/e' lateral: 5.8 LV IVS:        1.20 cm  LV e' medial:    7.18 cm/s LVOT diam:     2.00 cm  LV E/e' medial:  7.6 LV SV:         99 LV SV Index:   57 LVOT Area:     3.14 cm  LEFT ATRIUM             Index       RIGHT ATRIUM           Index LA diam:        3.80 cm 2.16 cm/m  RA Area:     10.30 cm LA Vol (A2C):   31.0 ml 17.66 ml/m RA Volume:   16.50 ml  9.40 ml/m LA Vol (A4C):   37.5 ml 21.36 ml/m LA Biplane Vol: 35.9 ml 20.45 ml/m  AORTIC VALVE LVOT Vmax:   147.00 cm/s LVOT Vmean:  114.000 cm/s LVOT VTI:    0.316 m  AORTA Ao Root diam: 2.60 cm MITRAL VALVE MV Area (PHT): 2.54 cm    SHUNTS MV Decel Time: 299 msec    Systemic VTI:  0.32 m MV E velocity: 54.40 cm/s  Systemic Diam: 2.00 cm MV A velocity: 76.30 cm/s MV E/A ratio:  0.71 Jenkins Rouge MD Electronically signed by Jenkins Rouge MD Signature Date/Time: 02/12/2020/12:47:59 PM    Final    IR IMAGING GUIDED PORT INSERTION  Result Date: 02/16/2020 INDICATION: 73 year old female referred for port catheter placement EXAM: IMAGE GUIDED PORT CATHETER PLACEMENT MEDICATIONS: 2 g Ancef; The antibiotic was administered within an appropriate time interval prior to skin puncture. ANESTHESIA/SEDATION: Moderate (conscious) sedation was employed during this procedure. A total of Versed 1.0 mg and Fentanyl 50 mcg was administered intravenously. Moderate Sedation Time: 24 minutes. The patient's level of consciousness and vital signs were monitored continuously by radiology nursing throughout the procedure under my direct supervision. FLUOROSCOPY TIME:  Fluoroscopy Time: 0 minutes 6 seconds (0 mGy). COMPLICATIONS: None PROCEDURE: The procedure, risks, benefits, and alternatives were explained to  the patient. Questions regarding the procedure were encouraged and answered. The patient understands and consents to the procedure. Ultrasound survey was performed with images stored and sent to PACs. The right neck and chest was prepped with chlorhexidine, and draped in the usual sterile fashion using maximum barrier technique (cap and mask, sterile gown, sterile gloves, large sterile sheet, hand hygiene and cutaneous antiseptic). Antibiotic prophylaxis was provided with 2.0g Ancef administered IV one hour prior to skin incision. Local anesthesia was attained by infiltration with 1% lidocaine without epinephrine. Ultrasound demonstrated patency of the right internal jugular vein, and this was documented with an image. Under real-time ultrasound guidance, this vein was accessed with a 21 gauge micropuncture needle and image documentation was performed. A small dermatotomy was made at the access site with an 11 scalpel. A 0.018" wire was advanced into the SVC and used to estimate the length of the internal catheter. The access needle exchanged for a 87F micropuncture vascular sheath. The 0.018" wire was then removed and a 0.035" wire advanced into the IVC. An appropriate  location for the subcutaneous reservoir was selected below the clavicle and an incision was made through the skin and underlying soft tissues. The subcutaneous tissues were then dissected using a combination of blunt and sharp surgical technique and a pocket was formed. A single lumen power injectable portacatheter was then tunneled through the subcutaneous tissues from the pocket to the dermatotomy and the port reservoir placed within the subcutaneous pocket. The venous access site was then serially dilated and a peel away vascular sheath placed over the wire. The wire was removed and the port catheter advanced into position under fluoroscopic guidance. The catheter tip is positioned in the cavoatrial junction. This was documented with a spot image.  The portacatheter was then tested and found to flush and aspirate well. The port was flushed with saline followed by 100 units/mL heparinized saline. The pocket was then closed in two layers using first subdermal inverted interrupted absorbable sutures followed by a running subcuticular suture. The epidermis was then sealed with Dermabond. The dermatotomy at the venous access site was also seal with Dermabond. Patient tolerated the procedure well and remained hemodynamically stable throughout. No complications encountered and no significant blood loss encountered IMPRESSION: Status post right IJ port catheter placement. Signed, Dulcy Fanny. Dellia Nims, RPVI [facility] Electronically Signed   By: Corrie Mckusick D.O.   On: 02/16/2020 10:22    ELIGIBLE FOR AVAILABLE RESEARCH PROTOCOL: no  ASSESSMENT: 74 y.o. [place] woman status post left breast upper inner quadrant biopsy 01/15/2020 for a clinical T2 NX, stage IIB anaplastic carcinoma, triple negative, with an MIB-1 of 12%  (a) bone scan and CT chest 02/12/2020 show no evidence of metastatic disease  (1) neoadjuvant chemotherapy with doxorubicin and cyclophosphamide in dose dense fashion x4 started 02/23/2020, to be followed by weekly paclitaxel and carboplatin x12  (a) echo 0903 shows an ejection fraction in the 60-65% range  (2) definitive surgery to follow  (3) adjuvant radiation as appropriate   PLAN: Justyce will proceed to cycle 2 of AC chemotherapy today.  I am dropping her dexamethasone dose to 4 mg twice daily days 2 and 3 and then just the morning of day 4.  Hopefully she will tolerate this better as she had some symptoms from the Decadron previously.  She understands she is very likely to lose her hair within the next few days.  I reassured her that her hair will grow back stronger than before when it does recur  Given her excellent counts I do not think we need to  see her on day 8.  She will see me again on day 1 cycle 3.  Total encounter time 25 minutes.Donna Cruel, MD   03/08/2020 9:44 AM Medical Oncology and Hematology [facility] [place], [place] [address] Tel. [phone]    Fax. [phone]   I, Wilburn Mylar, am acting as scribe for Dr. Virgie [name]. Isys Tietje.  I, Lurline Del MD, have reviewed the above documentation for accuracy and completeness, and I agree with the above.   *Total Encounter Time as defined by the Centers for Medicare and Medicaid Services includes, in addition to the face-to-face time of a patient visit (documented in the note above) non-face-to-face time: obtaining and reviewing outside history, ordering and reviewing medications, tests or procedures, care coordination (communications with other health care professionals or caregivers) and documentation in the medical record.

## 2020-03-08 ENCOUNTER — Inpatient Hospital Stay: Payer: Medicare PPO

## 2020-03-08 ENCOUNTER — Inpatient Hospital Stay (HOSPITAL_BASED_OUTPATIENT_CLINIC_OR_DEPARTMENT_OTHER): Payer: Medicare PPO | Admitting: Oncology

## 2020-03-08 ENCOUNTER — Encounter: Payer: Self-pay | Admitting: Oncology

## 2020-03-08 ENCOUNTER — Other Ambulatory Visit: Payer: Self-pay

## 2020-03-08 ENCOUNTER — Inpatient Hospital Stay: Payer: Medicare PPO | Admitting: Medical

## 2020-03-08 ENCOUNTER — Encounter: Payer: Self-pay | Admitting: *Deleted

## 2020-03-08 VITALS — BP 141/74 | HR 93 | Temp 96.7°F | Resp 15 | Ht 63.0 in | Wt 157.1 lb

## 2020-03-08 DIAGNOSIS — C50212 Malignant neoplasm of upper-inner quadrant of left female breast: Secondary | ICD-10-CM

## 2020-03-08 DIAGNOSIS — Z95828 Presence of other vascular implants and grafts: Secondary | ICD-10-CM

## 2020-03-08 DIAGNOSIS — Z171 Estrogen receptor negative status [ER-]: Secondary | ICD-10-CM | POA: Diagnosis not present

## 2020-03-08 DIAGNOSIS — C50919 Malignant neoplasm of unspecified site of unspecified female breast: Secondary | ICD-10-CM

## 2020-03-08 DIAGNOSIS — Z5111 Encounter for antineoplastic chemotherapy: Secondary | ICD-10-CM | POA: Diagnosis not present

## 2020-03-08 LAB — CMP (CANCER CENTER ONLY)
ALT: 14 U/L (ref 0–44)
AST: 12 U/L — ABNORMAL LOW (ref 15–41)
Albumin: 3.4 g/dL — ABNORMAL LOW (ref 3.5–5.0)
Alkaline Phosphatase: 109 U/L (ref 38–126)
Anion gap: 5 (ref 5–15)
BUN: 12 mg/dL (ref 8–23)
CO2: 29 mmol/L (ref 22–32)
Calcium: 10.8 mg/dL — ABNORMAL HIGH (ref 8.9–10.3)
Chloride: 104 mmol/L (ref 98–111)
Creatinine: 0.95 mg/dL (ref 0.44–1.00)
GFR, Est AFR Am: >60 mL/min (ref >60)
GFR, Estimated: 60 mL/min — ABNORMAL LOW (ref >60)
Glucose, Bld: 209 mg/dL — ABNORMAL HIGH (ref 70–99)
Potassium: 3.6 mmol/L (ref 3.5–5.1)
Sodium: 138 mmol/L (ref 135–145)
Total Bilirubin: <0.2 mg/dL — ABNORMAL LOW (ref 0.3–1.2)
Total Protein: 7.3 g/dL (ref 6.5–8.1)

## 2020-03-08 LAB — CBC WITH DIFFERENTIAL (CANCER CENTER ONLY)
Abs Immature Granulocytes: 0.62 10*3/uL — ABNORMAL HIGH (ref 0.00–0.07)
Basophils Absolute: 0 10*3/uL (ref 0.0–0.1)
Basophils Relative: 0 %
Eosinophils Absolute: 0 10*3/uL (ref 0.0–0.5)
Eosinophils Relative: 0 %
HCT: 41.2 % (ref 36.0–46.0)
Hemoglobin: 13.3 g/dL (ref 12.0–15.0)
Immature Granulocytes: 6 %
Lymphocytes Relative: 17 %
Lymphs Abs: 1.8 10*3/uL (ref 0.7–4.0)
MCH: 27.8 pg (ref 26.0–34.0)
MCHC: 32.3 g/dL (ref 30.0–36.0)
MCV: 86 fL (ref 80.0–100.0)
Monocytes Absolute: 0.6 10*3/uL (ref 0.1–1.0)
Monocytes Relative: 5 %
Neutro Abs: 8 10*3/uL — ABNORMAL HIGH (ref 1.7–7.7)
Neutrophils Relative %: 72 %
Platelet Count: 209 10*3/uL (ref 150–400)
RBC: 4.79 MIL/uL (ref 3.87–5.11)
RDW: 15.1 % (ref 11.5–15.5)
WBC Count: 11.1 10*3/uL — ABNORMAL HIGH (ref 4.0–10.5)
nRBC: 0.2 % (ref 0.0–0.2)

## 2020-03-08 MED ORDER — SODIUM CHLORIDE 0.9 % IV SOLN
150.0000 mg | Freq: Once | INTRAVENOUS | Status: AC
  Administered 2020-03-08: 150 mg via INTRAVENOUS
  Filled 2020-03-08: qty 150

## 2020-03-08 MED ORDER — SODIUM CHLORIDE 0.9 % IV SOLN
600.0000 mg/m2 | Freq: Once | INTRAVENOUS | Status: AC
  Administered 2020-03-08: 1080 mg via INTRAVENOUS
  Filled 2020-03-08: qty 54

## 2020-03-08 MED ORDER — SODIUM CHLORIDE 0.9% FLUSH
10.0000 mL | INTRAVENOUS | Status: DC | PRN
  Administered 2020-03-08: 10 mL
  Filled 2020-03-08: qty 10

## 2020-03-08 MED ORDER — PALONOSETRON HCL INJECTION 0.25 MG/5ML
INTRAVENOUS | Status: AC
  Filled 2020-03-08: qty 5

## 2020-03-08 MED ORDER — HEPARIN SOD (PORK) LOCK FLUSH 100 UNIT/ML IV SOLN
500.0000 [IU] | Freq: Once | INTRAVENOUS | Status: AC | PRN
  Administered 2020-03-08: 500 [IU]
  Filled 2020-03-08: qty 5

## 2020-03-08 MED ORDER — DOXORUBICIN HCL CHEMO IV INJECTION 2 MG/ML
60.0000 mg/m2 | Freq: Once | INTRAVENOUS | Status: AC
  Administered 2020-03-08: 108 mg via INTRAVENOUS
  Filled 2020-03-08: qty 54

## 2020-03-08 MED ORDER — SODIUM CHLORIDE 0.9 % IV SOLN
10.0000 mg | Freq: Once | INTRAVENOUS | Status: AC
  Administered 2020-03-08: 10 mg via INTRAVENOUS
  Filled 2020-03-08: qty 10

## 2020-03-08 MED ORDER — PALONOSETRON HCL INJECTION 0.25 MG/5ML
0.2500 mg | Freq: Once | INTRAVENOUS | Status: AC
  Administered 2020-03-08: 0.25 mg via INTRAVENOUS

## 2020-03-08 MED ORDER — SODIUM CHLORIDE 0.9% FLUSH
10.0000 mL | INTRAVENOUS | Status: DC | PRN
  Administered 2020-03-08: 10 mL via INTRAVENOUS
  Filled 2020-03-08: qty 10

## 2020-03-08 MED ORDER — SODIUM CHLORIDE 0.9 % IV SOLN
Freq: Once | INTRAVENOUS | Status: AC
  Filled 2020-03-08: qty 250

## 2020-03-08 NOTE — Patient Instructions (Signed)

## 2020-03-08 NOTE — Progress Notes (Signed)
Pt is approved for the $1000 grant.  

## 2020-03-08 NOTE — Patient Instructions (Signed)
[facility] Discharge Instructions for Patients Receiving Chemotherapy  Today you received the following chemotherapy agents Adriamycin and Cytoxan  To help prevent nausea and vomiting after your treatment, we encourage you to take your nausea medication as directed.  If you develop nausea and vomiting that is not controlled by your nausea medication, call the clinic.   BELOW ARE SYMPTOMS THAT SHOULD BE REPORTED IMMEDIATELY:  *FEVER GREATER THAN 100.5 F  *CHILLS WITH OR WITHOUT FEVER  NAUSEA AND VOMITING THAT IS NOT CONTROLLED WITH YOUR NAUSEA MEDICATION  *UNUSUAL SHORTNESS OF BREATH  *UNUSUAL BRUISING OR BLEEDING  TENDERNESS IN MOUTH AND THROAT WITH OR WITHOUT PRESENCE OF ULCERS  *URINARY PROBLEMS  *BOWEL PROBLEMS  UNUSUAL RASH Items with * indicate a potential emergency and should be followed up as soon as possible.  Feel free to call the clinic should you have any questions or concerns. The clinic phone number is [phone].  Please show the CHEMO ALERT CARD at check-in to the Emergency Department and triage nurse.   

## 2020-03-09 ENCOUNTER — Telehealth: Payer: Self-pay | Admitting: *Deleted

## 2020-03-09 ENCOUNTER — Telehealth: Payer: Self-pay | Admitting: Oncology

## 2020-03-09 NOTE — Telephone Encounter (Signed)
No 9/28 los. No changes made to pt's schedule.

## 2020-03-09 NOTE — Telephone Encounter (Signed)
This RN sent in lorazepam prescription per chemo regimen per call from daughter.  Labrecka verbalized understanding of need to use sparingly for best outcome.

## 2020-03-10 ENCOUNTER — Other Ambulatory Visit: Payer: Self-pay

## 2020-03-10 ENCOUNTER — Inpatient Hospital Stay: Payer: Medicare PPO

## 2020-03-10 ENCOUNTER — Other Ambulatory Visit: Payer: Self-pay | Admitting: Oncology

## 2020-03-10 VITALS — BP 131/81 | HR 78 | Temp 98.6°F | Resp 18

## 2020-03-10 DIAGNOSIS — C50919 Malignant neoplasm of unspecified site of unspecified female breast: Secondary | ICD-10-CM

## 2020-03-10 DIAGNOSIS — Z171 Estrogen receptor negative status [ER-]: Secondary | ICD-10-CM

## 2020-03-10 DIAGNOSIS — Z5111 Encounter for antineoplastic chemotherapy: Secondary | ICD-10-CM | POA: Diagnosis not present

## 2020-03-10 MED ORDER — PEGFILGRASTIM-JMDB 6 MG/0.6ML ~~LOC~~ SOSY
PREFILLED_SYRINGE | SUBCUTANEOUS | Status: AC
  Filled 2020-03-10: qty 0.6

## 2020-03-10 MED ORDER — PEGFILGRASTIM-JMDB 6 MG/0.6ML ~~LOC~~ SOSY
6.0000 mg | PREFILLED_SYRINGE | Freq: Once | SUBCUTANEOUS | Status: AC
  Administered 2020-03-10: 6 mg via SUBCUTANEOUS

## 2020-03-10 NOTE — Progress Notes (Signed)
[place]  [phone] [phone]     ID: Daneka Lantigua DOB: March 01, 1946  MR#: [id number]  RWE#:[id number]  This dictation has been corrected to remove DRAGON dictation errors. Patient Care Team: Montez Morita, PA-C as PCP - General (Hematology and Oncology) Magrinat, [name], MD as Consulting Physician (Oncology) [name], MD as Consulting Physician (General Surgery) [name], MD as Consulting Physician (Orthopedic Surgery) [name], RN as Oncology Nurse Navigator [name], RN as Oncology Nurse Navigator Chauncey Cruel, MD OTHER MD: [name] MD ([place]); [name] NP  CHIEF COMPLAINT: Triple negative breast cancer  CURRENT TREATMENT: neoadjuvant chemotherapy   INTERVAL HISTORY: Amarys returns today for follow up and treatment of her triple negative breast cancer accompanied by her son [name].   She began neoadjuvant chemotherapy, consisting of cyclophosphamide and doxorubicin with pegfilgrastim on day 3, on 02/23/2020. Today is day 1 cycle 2.   REVIEW OF SYSTEMS: Sofya continues to do remarkably well through these intensive treatments.  Yesterday she obtained a cane. She walks around the house (she does not know the neighborhood here").  She did not feel "great great "which is the way she usually feels, but she felt "okay".  She has only now begun to lose some of her hair.  A detailed review of systems today was otherwise stable   HISTORY OF CURRENT ILLNESS: From the original intake note:  Atley herself palpated a mass in her left breast during showering.  She brought this to medical attention and on 12/16/2019 underwent mammography, the results of which I do not have today.  She proceeded to left breast ultrasound showing breast density B.  There was a mass in the upper inner quadrant of the left breast at the 10 o'clock position 7 cm from the nipple.  It measured 3.26 cm.  There are no ultrasound  comments regarding the axilla.  Biopsy of the mass in question was performed 01/15/2020 at [place] in [facility] and the pathology report describes a biphasic tumor with malignant epithelial component and a mesenchymal component.  The epithelial component appears to squamoid and stain strongly for CK 8/18 by immunohistochemistry.  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  The tumor was essentially triple negative, estrogen receptor 0%, progesterone receptor 1% with 1+ intensity, HER-2 1+ by immunohistochemistry, Ki-67 12%.  (The 1% progesterone receptor positivity is best read as an artifact as the progesterone receptor does not function in the absence of a functional estrogen receptor).  The patient's subsequent history is as detailed below   PAST MEDICAL HISTORY: Past Medical History:  Diagnosis Date   Arthritis    oa   History of kidney stones    Hypertension     PAST SURGICAL HISTORY: Past Surgical History:  Procedure Laterality Date   CYSTOSCOPY  yrs ago   IR IMAGING GUIDED PORT INSERTION  02/16/2020   TOTAL KNEE ARTHROPLASTY Left 06/05/2016   Procedure: LEFT TOTAL KNEE ARTHROPLASTY;  Surgeon: [name], MD;  Location: [facility];  Service: Orthopedics;  Laterality: Left;    FAMILY HISTORY No family history on file.  The patient has no information on her father.  Her mother died at the age of 72.  She had 1 sister and 2 brothers.  No one on that side of the family is known to have had cancer.  The patient herself had 5/2 siblings, 2 sisters and  3 brothers, none with a history of cancer.   GYNECOLOGIC HISTORY:  No LMP recorded. Patient is postmenopausal. Menarche: 74 years old Age at first live birth: 74 years old [place] P 2 LMP mid 38s Contraceptive no HRT no  Hysterectomy?  No Salpingo-oophorectomy?  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  She is divorced and her former husband subsequently died.  She lives by herself with no pets.  Her son [name] 30 is a Production manager and travels a great deal as a Optometrist.  Daughter [name] teaches middle school in [place].  The patient has 4 grandchildren, no great-grandchildren.  She is a Psychologist, forensic.    ADVANCED DIRECTIVES: Not in place   HEALTH MAINTENANCE: Social History   Tobacco Use   Smoking status: Never Smoker   Smokeless tobacco: Never Used  Substance Use Topics   Alcohol use: No   Drug use: No     Colonoscopy: Never  PAP: Remote  Bone density: Remote   Allergies  Allergen Reactions   Benazepril Swelling    Patient had severe angioedema on 10/05/2015.    Current Outpatient Medications  Medication Sig Dispense Refill   amLODipine (NORVASC) 10 MG tablet Take 10 mg by mouth daily after lunch.      aspirin 81 MG chewable tablet Chew 1 tablet (81 mg total) by mouth 2 (two) times daily. Take for 4 weeks. 60 tablet 0   chlorthalidone (HYGROTON) 25 MG tablet Take 25 mg by mouth daily after lunch.      Cholecalciferol (VITAMIN D) 2000 units CAPS Take 2,000 Units by mouth daily after lunch.     dexamethasone (DECADRON) 4 MG tablet Take 2 tablets by mouth daily starting the day after Carboplatin and Cytoxan x 3 days. Take with food. 30 tablet 1   lidocaine-prilocaine (EMLA) cream Apply to affected area once 30 g 3   loratadine (CLARITIN) 10 MG tablet Take 1 tablet (10 mg total) by mouth daily. Starting the day after chemotherapy for 10 days, then as needed. 30 tablet 3   losartan (COZAAR) 50 MG tablet Take 50 mg by mouth daily after lunch.     Multiple Vitamins-Minerals (CENTRUM ADULTS) TABS 1 tab(s)     potassium chloride (MICRO-K) 10 MEQ CR capsule Take 10 mEq by mouth 2 (two) times daily.     prochlorperazine (COMPAZINE) 10 MG tablet Take 1 tablet (10 mg total) by mouth every 6 (six) hours as needed (Nausea or vomiting). 30 tablet 1     No current facility-administered medications for this visit.    OBJECTIVE: African-American woman who appears stated age There were no vitals filed for this visit.   There is no height or weight on file to calculate BMI.   Wt Readings from Last 3 Encounters:  03/08/20 157 lb 1.6 oz (71.3 kg)  02/29/20 157 lb 3.2 oz (71.3 kg)  02/23/20 158 lb (71.7 kg)      ECOG FS:1 - Symptomatic but completely ambulatory  Sclerae unicteric, EOMs intact Wearing a mask No cervical or supraclavicular adenopathy Lungs no rales or rhonchi Heart regular rate and rhythm Abd soft, nontender, positive bowel sounds MSK no focal spinal tenderness, no upper extremity lymphedema Neuro: nonfocal, well oriented, appropriate affect Breasts: The right breast is benign.  The mass in the anterior superior aspect of the left breast may be slightly smaller and slightly softer, but this is not very marked at this point.  Both axillae are benign   Left breast 02/04/2020    LAB RESULTS:  CMP     Component Value Date/Time   NA 138 03/08/2020 0906   K 3.6 03/08/2020 0906   CL 104 03/08/2020 0906   CO2 29 03/08/2020 0906   GLUCOSE 209 (H) 03/08/2020 0906   BUN 12 03/08/2020 0906   CREATININE 0.95 03/08/2020 0906   CALCIUM 10.8 (H) 03/08/2020 0906   PROT 7.3 03/08/2020 0906   ALBUMIN 3.4 (L) 03/08/2020 0906   AST 12 (L) 03/08/2020 0906   ALT 14 03/08/2020 0906   ALKPHOS 109 03/08/2020 0906   BILITOT <0.2 (L) 03/08/2020 0906   GFRNONAA 60 (L) 03/08/2020 0906   GFRAA >60 03/08/2020 0906    No results found for: TOTALPROTELP, ALBUMINELP, A1GS, A2GS, BETS, BETA2SER, GAMS, MSPIKE, SPEI  No results found for: Nils Pyle, [facility]  Lab Results  Component Value Date   WBC 11.1 (H) 03/08/2020   NEUTROABS 8.0 (H) 03/08/2020   HGB 13.3 03/08/2020   HCT 41.2 03/08/2020   MCV 86.0 03/08/2020   PLT 209 03/08/2020      Chemistry      Component Value Date/Time   NA 138 03/08/2020 0906    K 3.6 03/08/2020 0906   CL 104 03/08/2020 0906   CO2 29 03/08/2020 0906   BUN 12 03/08/2020 0906   CREATININE 0.95 03/08/2020 0906      Component Value Date/Time   CALCIUM 10.8 (H) 03/08/2020 0906   ALKPHOS 109 03/08/2020 0906   AST 12 (L) 03/08/2020 0906   ALT 14 03/08/2020 0906   BILITOT <0.2 (L) 03/08/2020 0906       No results found for: LABCA2  No components found for: MPNTIR443  No results for input(s): INR in the last 168 hours.  No results found for: LABCA2  No results found for: XVQ008  No results found for: QPY195  No results found for: KDT267  No results found for: CA2729  No components found for: HGQUANT  No results found for: CEA1 / No results found for: CEA1   No results found for: AFPTUMOR  No results found for: CHROMOGRNA  No results found for: HGBA, HGBA2QUANT, HGBFQUANT, HGBSQUAN (Hemoglobinopathy evaluation)   No results found for: LDH  No results found for: IRON, TIBC, IRONPCTSAT (Iron and TIBC)  No results found for: FERRITIN  Urinalysis No results found for: COLORURINE, APPEARANCEUR, LABSPEC, PHURINE, GLUCOSEU, HGBUR, BILIRUBINUR, KETONESUR, PROTEINUR, UROBILINOGEN, NITRITE, LEUKOCYTESUR   STUDIES: CT Chest W Contrast  Result Date: 02/12/2020 CLINICAL DATA:  Breast cancer. EXAM: CT CHEST WITH CONTRAST TECHNIQUE: Multidetector CT imaging of the chest was performed during intravenous contrast administration. CONTRAST:  47m OMNIPAQUE IOHEXOL 300 MG/ML  SOLN COMPARISON:  None. FINDINGS: Cardiovascular: Atherosclerotic calcification of the aorta and coronary arteries. Heart is mildly enlarged. No pericardial effusion. Mediastinum/Nodes: No pathologically enlarged mediastinal, hilar or internal mammary lymph nodes. Left axillary lymph nodes are not enlarged by CT size criteria, measuring up to 7 mm. Esophagus is grossly unremarkable. Lungs/Pleura: No suspicious pulmonary nodules. Minimal scarring in the right lower lobe. Lungs are otherwise  clear. No pleural fluid. Airway is unremarkable. Upper Abdomen: Visualized portions of the liver, gallbladder and adrenal glands are unremarkable. Large cystic structures in the right kidney with marked cortical thinning and areas of calcification, incompletely imaged. Visualized portions of the left kidney, spleen, pancreas, stomach and bowel are otherwise unremarkable. No upper abdominal adenopathy. Musculoskeletal: Degenerative changes in the spine. No worrisome lytic or sclerotic lesions. Irregular  mass in the medial left breast measures 3.2 x 3.9 cm. IMPRESSION: 1. Left breast mass and indeterminate subcentimeter left axillary lymph node. Otherwise, no evidence of metastatic disease. 2. Cystic changes in the right kidney with associated renal cortical thinning and calcification, findings which can be seen with chronic UPJ obstruction. However, full assessment is limited due to incomplete visualization and lack of delayed imaging. If further evaluation is desired, dedicated CT abdomen pelvis with contrast is suggested. 3. Aortic atherosclerosis (ICD10-I70.0). Coronary artery calcification. Electronically Signed   By: Lorin Picket M.D.   On: 02/12/2020 08:12   NM Bone Scan Whole Body  Result Date: 02/12/2020 CLINICAL DATA:  Breast cancer, staging examination EXAM: NUCLEAR MEDICINE WHOLE BODY BONE SCAN TECHNIQUE: Whole body anterior and posterior images were obtained approximately 3 hours after intravenous injection of radiopharmaceutical. RADIOPHARMACEUTICALS:  21.1 mCi Technetium-14mMDP IV COMPARISON:  None. FINDINGS: There is focal uptake of radiotracer within the anterior left chest likely representing uptake of tracer within the primary lesion seen on CT examination performed concurrently. Uptake of tracer within the mandible likely relates to periodontal disease. Defect within the left knee likely relates to left total knee arthroplasty with stress reaction within the subjacent tibia. Uptake within  the left shoulder and right knee is likely degenerative in nature. There is uptake and retention within the right retroperitoneum corresponding to thesevere hydronephrosis involving the visualized right kidney on accompanying CT examination. Normal uptake and excretion on the left into the bladder. IMPRESSION: Soft tissue uptake within the primary lesion within the left breast. No evidence of osseous metastatic disease. Severe right hydronephrosis Electronically Signed   By: AFidela SalisburyMD   On: 02/12/2020 20:37   MR BREAST BILATERAL W WO CONTRAST INC CAD  Result Date: 02/11/2020 CLINICAL DATA:  74year old with a biopsy proven metaplastic carcinoma (carcinosarcoma) involving the UPPER INNER QUADRANT of the LEFT breast. ([facility] [place] [facility] [place] LABS:  Not applicable. EXAM: BILATERAL BREAST MRI WITH AND WITHOUT CONTRAST TECHNIQUE: Multiplanar, multisequence MR images of both breasts were obtained prior to and following the intravenous administration of 7 ml of Gadavist. Three-dimensional MR images were rendered by post-processing of the original MR data on an independent workstation. The three-dimensional MR images were interpreted, and findings are reported in the following complete MRI report for this study. Three dimensional images were evaluated at the independent interpreting workstation using the DynaCAD thin client. COMPARISON:  No prior MRI. Outside mammography and LEFT breast ultrasound is correlated. FINDINGS: Breast composition: b. Scattered fibroglandular tissue. Background parenchymal enhancement: Mild. Right breast: No mass or abnormal enhancement. Left breast: Enhancing heterogeneous irregular mass involving the LOWER INNER QUADRANT measuring approximately 4.4 x 4.0 x 3.9 cm (transverse x AP x craniocaudal), extending to the skin surface of the  INNER breast, corresponding to the biopsy-proven malignancy. No mass or abnormal enhancement elsewhere. Lymph nodes: No pathologic lymphadenopathy. Ancillary findings:  None. IMPRESSION: 1. Biopsy-proven malignancy involving the LOWER INNER QUADRANT of the LEFT breast measuring approximately 4.4 cm maximally. The mass extends to the skin surface of the INNER breast. 2. No MRI evidence of malignancy involving the RIGHT breast. 3. No pathologic lymphadenopathy. RECOMMENDATION: Treatment plan for the known LEFT breast malignancy. BI-RADS CATEGORY  6: Known biopsy-proven malignancy. Electronically Signed   By: TEvangeline DakinM.D.   On: 02/11/2020 15:59   ECHOCARDIOGRAM COMPLETE  Result  Date: 02/12/2020    ECHOCARDIOGRAM REPORT   Patient Name:   KEASHIA HASKINS Date of Exam: 02/12/2020 Medical Rec #:  [id number]        Height:       63.5 in Accession #:    [id number]       Weight:       157.1 lb Date of Birth:  1946/04/18        BSA:          1.755 m Patient Age:    65 years         BP:           162/84 mmHg Patient Gender: F                HR:           79 bpm. Exam Location:  Outpatient Procedure: 2D Echo Indications:    chemo eval  History:        Patient has no prior history of Echocardiogram examinations.                 Risk Factors:Hypertension. Cancer.  Sonographer:    Jannett Celestine RDCS (AE) Referring Phys: 28 GUSTAV Everett Graff  Sonographer Comments: challenging apical windows IMPRESSIONS  1. Small LV cavity with prominent papillary muscles mid/apical cavity obliteration in systole Normal GLS -18.4 . Left ventricular ejection fraction, by estimation, is 60 to 65%. The left ventricle has normal function. The left ventricle has no regional wall motion abnormalities. There is mild left ventricular hypertrophy. Left ventricular diastolic parameters were normal.  2. Right ventricular systolic function is normal. The right ventricular size is normal.  3. Left atrial size was mildly dilated.  4. The mitral valve  is normal in structure. No evidence of mitral valve regurgitation. No evidence of mitral stenosis.  5. The aortic valve is tricuspid. Aortic valve regurgitation is not visualized. Mild to moderate aortic valve sclerosis/calcification is present, without any evidence of aortic stenosis.  6. The inferior vena cava is normal in size with greater than 50% respiratory variability, suggesting right atrial pressure of 3 mmHg. FINDINGS  Left Ventricle: Small LV cavity with prominent papillary muscles mid/apical cavity obliteration in systole Normal GLS -18.4. Left ventricular ejection fraction, by estimation, is 60 to 65%. The left ventricle has normal function. The left ventricle has no regional wall motion abnormalities. The left ventricular internal cavity size was small. There is mild left ventricular hypertrophy. Left ventricular diastolic parameters were normal. Right Ventricle: The right ventricular size is normal. No increase in right ventricular wall thickness. Right ventricular systolic function is normal. Left Atrium: Left atrial size was mildly dilated. Right Atrium: Right atrial size was normal in size. Pericardium: There is no evidence of pericardial effusion. Mitral Valve: The mitral valve is normal in structure. Normal mobility of the mitral valve leaflets. No evidence of mitral valve regurgitation. No evidence of mitral valve stenosis. Tricuspid Valve: The tricuspid valve is normal in structure. Tricuspid valve regurgitation is mild . No evidence of tricuspid stenosis. Aortic Valve: The aortic valve is tricuspid. Aortic valve regurgitation is not visualized. Mild to moderate aortic valve sclerosis/calcification is present, without any evidence of aortic stenosis. Pulmonic Valve: The pulmonic valve was normal in structure. Pulmonic valve regurgitation is not visualized. No evidence of pulmonic stenosis. Aorta: The aortic root is normal in size and structure. Venous: The inferior vena cava is normal in size  with greater than 50% respiratory variability, suggesting right atrial pressure of 3  mmHg. IAS/Shunts: The interatrial septum was not well visualized.  LEFT VENTRICLE PLAX 2D LVIDd:         3.30 cm  Diastology LVIDs:         2.20 cm  LV e' lateral:   9.46 cm/s LV PW:         1.10 cm  LV E/e' lateral: 5.8 LV IVS:        1.20 cm  LV e' medial:    7.18 cm/s LVOT diam:     2.00 cm  LV E/e' medial:  7.6 LV SV:         99 LV SV Index:   57 LVOT Area:     3.14 cm  LEFT ATRIUM             Index       RIGHT ATRIUM           Index LA diam:        3.80 cm 2.16 cm/m  RA Area:     10.30 cm LA Vol (A2C):   31.0 ml 17.66 ml/m RA Volume:   16.50 ml  9.40 ml/m LA Vol (A4C):   37.5 ml 21.36 ml/m LA Biplane Vol: 35.9 ml 20.45 ml/m  AORTIC VALVE LVOT Vmax:   147.00 cm/s LVOT Vmean:  114.000 cm/s LVOT VTI:    0.316 m  AORTA Ao Root diam: 2.60 cm MITRAL VALVE MV Area (PHT): 2.54 cm    SHUNTS MV Decel Time: 299 msec    Systemic VTI:  0.32 m MV E velocity: 54.40 cm/s  Systemic Diam: 2.00 cm MV A velocity: 76.30 cm/s MV E/A ratio:  0.71 Jenkins Rouge MD Electronically signed by Jenkins Rouge MD Signature Date/Time: 02/12/2020/12:47:59 PM    Final    IR IMAGING GUIDED PORT INSERTION  Result Date: 02/16/2020 INDICATION: 74 year old female referred for port catheter placement EXAM: IMAGE GUIDED PORT CATHETER PLACEMENT MEDICATIONS: 2 g Ancef; The antibiotic was administered within an appropriate time interval prior to skin puncture. ANESTHESIA/SEDATION: Moderate (conscious) sedation was employed during this procedure. A total of Versed 1.0 mg and Fentanyl 50 mcg was administered intravenously. Moderate Sedation Time: 24 minutes. The patient's level of consciousness and vital signs were monitored continuously by radiology nursing throughout the procedure under my direct supervision. FLUOROSCOPY TIME:  Fluoroscopy Time: 0 minutes 6 seconds (0 mGy). COMPLICATIONS: None PROCEDURE: The procedure, risks, benefits, and alternatives were  explained to the patient. Questions regarding the procedure were encouraged and answered. The patient understands and consents to the procedure. Ultrasound survey was performed with images stored and sent to PACs. The right neck and chest was prepped with chlorhexidine, and draped in the usual sterile fashion using maximum barrier technique (cap and mask, sterile gown, sterile gloves, large sterile sheet, hand hygiene and cutaneous antiseptic). Antibiotic prophylaxis was provided with 2.0g Ancef administered IV one hour prior to skin incision. Local anesthesia was attained by infiltration with 1% lidocaine without epinephrine. Ultrasound demonstrated patency of the right internal jugular vein, and this was documented with an image. Under real-time ultrasound guidance, this vein was accessed with a 21 gauge micropuncture needle and image documentation was performed. A small dermatotomy was made at the access site with an 11 scalpel. A 0.018" wire was advanced into the SVC and used to estimate the length of the internal catheter. The access needle exchanged for a 24F micropuncture vascular sheath. The 0.018" wire was then removed and a 0.035" wire advanced into the IVC. An appropriate location  for the subcutaneous reservoir was selected below the clavicle and an incision was made through the skin and underlying soft tissues. The subcutaneous tissues were then dissected using a combination of blunt and sharp surgical technique and a pocket was formed. A single lumen power injectable portacatheter was then tunneled through the subcutaneous tissues from the pocket to the dermatotomy and the port reservoir placed within the subcutaneous pocket. The venous access site was then serially dilated and a peel away vascular sheath placed over the wire. The wire was removed and the port catheter advanced into position under fluoroscopic guidance. The catheter tip is positioned in the cavoatrial junction. This was documented with a  spot image. The portacatheter was then tested and found to flush and aspirate well. The port was flushed with saline followed by 100 units/mL heparinized saline. The pocket was then closed in two layers using first subdermal inverted interrupted absorbable sutures followed by a running subcuticular suture. The epidermis was then sealed with Dermabond. The dermatotomy at the venous access site was also seal with Dermabond. Patient tolerated the procedure well and remained hemodynamically stable throughout. No complications encountered and no significant blood loss encountered IMPRESSION: Status post right IJ port catheter placement. Signed, Dulcy Fanny. Dellia Nims, RPVI [facility] Electronically Signed   By: Corrie Mckusick D.O.   On: 02/16/2020 10:22    ELIGIBLE FOR AVAILABLE RESEARCH PROTOCOL: no  ASSESSMENT: 74 y.o. [place] woman status post left breast upper inner quadrant biopsy 01/15/2020 for a clinical T2 NX, stage IIB anaplastic carcinoma, triple negative, with an MIB-1 of 12%  (a) bone scan and CT chest 02/12/2020 show no evidence of metastatic disease  (1) neoadjuvant chemotherapy with doxorubicin and cyclophosphamide in dose dense fashion x4 started 02/23/2020, to be followed by weekly paclitaxel and carboplatin x12  (a) echo 0903 shows an ejection fraction in the 60-65% range  (2) definitive surgery to follow  (3) adjuvant radiation as appropriate   PLAN: Tomara will proceed to cycle 2 of AC chemotherapy today.  I am dropping her dexamethasone dose to 4 mg twice daily days 2 and 3 and then just the morning of day 4.  Hopefully she will tolerate this better as she had some symptoms from the Decadron previously.  She understands she is very likely to lose her hair within the next few days.  I reassured her that her hair will grow back stronger than before when it does recur  Given her excellent counts I do not think  we need to see her on day 8.  She will see me again on day 1 cycle 3.  Total encounter time 25 minutes.Chauncey Cruel, MD   03/10/2020 8:43 PM Medical Oncology and Hematology [facility] [place], [place] [address] Tel. [phone]    Fax. [phone]   I, Wilburn Mylar, am acting as scribe for Dr. [name]. Magrinat.  I, Lurline Del MD, have reviewed the above documentation for accuracy and completeness, and I agree with the above.   *Total Encounter Time as defined by the Centers for Medicare and Medicaid Services includes, in addition to the face-to-face time of a patient visit (documented in the note above) non-face-to-face time: obtaining and reviewing outside history, ordering and reviewing medications, tests or procedures, care coordination (communications with other health care professionals or caregivers) and documentation in the medical record.

## 2020-03-21 ENCOUNTER — Other Ambulatory Visit: Payer: Self-pay

## 2020-03-21 DIAGNOSIS — Z171 Estrogen receptor negative status [ER-]: Secondary | ICD-10-CM

## 2020-03-21 DIAGNOSIS — C50212 Malignant neoplasm of upper-inner quadrant of left female breast: Secondary | ICD-10-CM

## 2020-03-22 ENCOUNTER — Other Ambulatory Visit: Payer: Medicare Other

## 2020-03-22 ENCOUNTER — Inpatient Hospital Stay (HOSPITAL_BASED_OUTPATIENT_CLINIC_OR_DEPARTMENT_OTHER): Payer: Medicare PPO | Admitting: Oncology

## 2020-03-22 ENCOUNTER — Inpatient Hospital Stay: Payer: Medicare PPO | Attending: Oncology

## 2020-03-22 ENCOUNTER — Inpatient Hospital Stay: Payer: Medicare PPO

## 2020-03-22 ENCOUNTER — Other Ambulatory Visit: Payer: Self-pay

## 2020-03-22 VITALS — BP 139/85 | HR 95 | Temp 97.7°F | Resp 18 | Ht 63.0 in | Wt 155.8 lb

## 2020-03-22 DIAGNOSIS — Z171 Estrogen receptor negative status [ER-]: Secondary | ICD-10-CM | POA: Diagnosis not present

## 2020-03-22 DIAGNOSIS — Z95828 Presence of other vascular implants and grafts: Secondary | ICD-10-CM

## 2020-03-22 DIAGNOSIS — Z5111 Encounter for antineoplastic chemotherapy: Secondary | ICD-10-CM | POA: Diagnosis not present

## 2020-03-22 DIAGNOSIS — Z5189 Encounter for other specified aftercare: Secondary | ICD-10-CM | POA: Diagnosis not present

## 2020-03-22 DIAGNOSIS — I10 Essential (primary) hypertension: Secondary | ICD-10-CM | POA: Insufficient documentation

## 2020-03-22 DIAGNOSIS — Z79899 Other long term (current) drug therapy: Secondary | ICD-10-CM | POA: Diagnosis not present

## 2020-03-22 DIAGNOSIS — Z7982 Long term (current) use of aspirin: Secondary | ICD-10-CM | POA: Diagnosis not present

## 2020-03-22 DIAGNOSIS — C50212 Malignant neoplasm of upper-inner quadrant of left female breast: Secondary | ICD-10-CM | POA: Insufficient documentation

## 2020-03-22 DIAGNOSIS — C50919 Malignant neoplasm of unspecified site of unspecified female breast: Secondary | ICD-10-CM | POA: Diagnosis not present

## 2020-03-22 LAB — CBC WITH DIFFERENTIAL (CANCER CENTER ONLY)
Abs Immature Granulocytes: 0.57 10*3/uL — ABNORMAL HIGH (ref 0.00–0.07)
Basophils Absolute: 0.1 10*3/uL (ref 0.0–0.1)
Basophils Relative: 1 %
Eosinophils Absolute: 0 10*3/uL (ref 0.0–0.5)
Eosinophils Relative: 0 %
HCT: 38.5 % (ref 36.0–46.0)
Hemoglobin: 12.5 g/dL (ref 12.0–15.0)
Immature Granulocytes: 5 %
Lymphocytes Relative: 12 %
Lymphs Abs: 1.3 10*3/uL (ref 0.7–4.0)
MCH: 28.9 pg (ref 26.0–34.0)
MCHC: 32.5 g/dL (ref 30.0–36.0)
MCV: 88.9 fL (ref 80.0–100.0)
Monocytes Absolute: 0.7 10*3/uL (ref 0.1–1.0)
Monocytes Relative: 6 %
Neutro Abs: 8.1 10*3/uL — ABNORMAL HIGH (ref 1.7–7.7)
Neutrophils Relative %: 76 %
Platelet Count: 227 10*3/uL (ref 150–400)
RBC: 4.33 MIL/uL (ref 3.87–5.11)
RDW: 16 % — ABNORMAL HIGH (ref 11.5–15.5)
WBC Count: 10.8 10*3/uL — ABNORMAL HIGH (ref 4.0–10.5)
nRBC: 0.5 % — ABNORMAL HIGH (ref 0.0–0.2)

## 2020-03-22 LAB — CMP (CANCER CENTER ONLY)
ALT: 12 U/L (ref 0–44)
AST: 11 U/L — ABNORMAL LOW (ref 15–41)
Albumin: 3.4 g/dL — ABNORMAL LOW (ref 3.5–5.0)
Alkaline Phosphatase: 100 U/L (ref 38–126)
Anion gap: 7 (ref 5–15)
BUN: 12 mg/dL (ref 8–23)
CO2: 27 mmol/L (ref 22–32)
Calcium: 10.9 mg/dL — ABNORMAL HIGH (ref 8.9–10.3)
Chloride: 104 mmol/L (ref 98–111)
Creatinine: 0.92 mg/dL (ref 0.44–1.00)
GFR, Estimated: >60 mL/min (ref >60)
Glucose, Bld: 205 mg/dL — ABNORMAL HIGH (ref 70–99)
Potassium: 3.2 mmol/L — ABNORMAL LOW (ref 3.5–5.1)
Sodium: 138 mmol/L (ref 135–145)
Total Bilirubin: <0.2 mg/dL — ABNORMAL LOW (ref 0.3–1.2)
Total Protein: 7 g/dL (ref 6.5–8.1)

## 2020-03-22 MED ORDER — SODIUM CHLORIDE 0.9 % IV SOLN
10.0000 mg | Freq: Once | INTRAVENOUS | Status: AC
  Administered 2020-03-22: 10 mg via INTRAVENOUS
  Filled 2020-03-22: qty 10

## 2020-03-22 MED ORDER — HEPARIN SOD (PORK) LOCK FLUSH 100 UNIT/ML IV SOLN
500.0000 [IU] | Freq: Once | INTRAVENOUS | Status: AC | PRN
  Administered 2020-03-22: 500 [IU]
  Filled 2020-03-22: qty 5

## 2020-03-22 MED ORDER — SODIUM CHLORIDE 0.9% FLUSH
10.0000 mL | INTRAVENOUS | Status: DC | PRN
  Administered 2020-03-22: 10 mL
  Filled 2020-03-22: qty 10

## 2020-03-22 MED ORDER — PALONOSETRON HCL INJECTION 0.25 MG/5ML
0.2500 mg | Freq: Once | INTRAVENOUS | Status: AC
  Administered 2020-03-22: 0.25 mg via INTRAVENOUS

## 2020-03-22 MED ORDER — PALONOSETRON HCL INJECTION 0.25 MG/5ML
INTRAVENOUS | Status: AC
  Filled 2020-03-22: qty 5

## 2020-03-22 MED ORDER — SODIUM CHLORIDE 0.9 % IV SOLN
150.0000 mg | Freq: Once | INTRAVENOUS | Status: AC
  Administered 2020-03-22: 150 mg via INTRAVENOUS
  Filled 2020-03-22: qty 150

## 2020-03-22 MED ORDER — SODIUM CHLORIDE 0.9 % IV SOLN
Freq: Once | INTRAVENOUS | Status: AC
  Filled 2020-03-22: qty 250

## 2020-03-22 MED ORDER — SODIUM CHLORIDE 0.9 % IV SOLN
600.0000 mg/m2 | Freq: Once | INTRAVENOUS | Status: AC
  Administered 2020-03-22: 1080 mg via INTRAVENOUS
  Filled 2020-03-22: qty 54

## 2020-03-22 MED ORDER — DOXORUBICIN HCL CHEMO IV INJECTION 2 MG/ML
60.0000 mg/m2 | Freq: Once | INTRAVENOUS | Status: AC
  Administered 2020-03-22: 108 mg via INTRAVENOUS
  Filled 2020-03-22: qty 54

## 2020-03-22 NOTE — Patient Instructions (Signed)
[facility] Discharge Instructions for Patients Receiving Chemotherapy  Today you received the following chemotherapy agents Adriamycin and Cytoxan  To help prevent nausea and vomiting after your treatment, we encourage you to take your nausea medication as directed.  If you develop nausea and vomiting that is not controlled by your nausea medication, call the clinic.   BELOW ARE SYMPTOMS THAT SHOULD BE REPORTED IMMEDIATELY:  *FEVER GREATER THAN 100.5 F  *CHILLS WITH OR WITHOUT FEVER  NAUSEA AND VOMITING THAT IS NOT CONTROLLED WITH YOUR NAUSEA MEDICATION  *UNUSUAL SHORTNESS OF BREATH  *UNUSUAL BRUISING OR BLEEDING  TENDERNESS IN MOUTH AND THROAT WITH OR WITHOUT PRESENCE OF ULCERS  *URINARY PROBLEMS  *BOWEL PROBLEMS  UNUSUAL RASH Items with * indicate a potential emergency and should be followed up as soon as possible.  Feel free to call the clinic should you have any questions or concerns. The clinic phone number is [phone].  Please show the CHEMO ALERT CARD at check-in to the Emergency Department and triage nurse.   

## 2020-03-22 NOTE — Progress Notes (Signed)
Donna [name]  Telephone:(336) [phone] [phone]     ID: Donna [name] DOB: 06-May-1946  MR#: [id number]  XLK#:[id number]  This dictation has been corrected to remove DRAGON dictation errors. Patient Care Team: Montez Morita, PA-C as PCP - General (Hematology and Oncology) Darron Stuck, [name], MD as Consulting Physician (Oncology) [name], MD as Consulting Physician (General Surgery) [name], MD as Consulting Physician (Orthopedic Surgery) [name], RN as Oncology Nurse Navigator [name], RN as Oncology Nurse Navigator Chauncey Cruel, MD OTHER MD: [name] MD ([facility]); [name] NP  CHIEF COMPLAINT: Triple negative breast cancer  CURRENT TREATMENT: neoadjuvant chemotherapy   INTERVAL HISTORY: Donna [name] returns today for follow up and treatment of her triple negative breast cancer accompanied by her son Donna [name].   She began neoadjuvant chemotherapy, consisting of cyclophosphamide and doxorubicin with pegfilgrastim on day 3, on 02/23/2020. Today is day 1 cycle 3.  This will be followed by carboplatin and paclitaxel weekly x12.   REVIEW OF SYSTEMS: Donna [name] continues to do remarkably well with her treatments.  She has lost most but not all of her hair.  She has had no problems with nausea vomiting rash fever or other intercurrent symptoms.  In fact her family let her go back to her own place and she was on her own for the last week.  They would let her trim the bushes she said but she did a little bit of yard work despite that.  A detailed review of systems today was otherwise stable   HISTORY OF CURRENT ILLNESS: From the original intake note:  Donna [name] herself palpated a mass in her left breast during showering.  She brought this to medical attention and on 12/16/2019 underwent mammography, the results of which I do not have today.  She proceeded to left breast ultrasound showing breast density B.  There was a mass in the upper  inner quadrant of the left breast at the 10 o'clock position 7 cm from the nipple.  It measured 3.26 cm.  There are no ultrasound comments regarding the axilla.  Biopsy of the mass in question was performed 01/15/2020 at [place] in [facility] and the pathology report describes a biphasic tumor with malignant epithelial component and a mesenchymal component.  The epithelial component appears to squamoid and stain strongly for CK 8/18 by immunohistochemistry.  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  The tumor was essentially triple negative, estrogen receptor 0%, progesterone receptor 1% with 1+ intensity, HER-2 1+ by immunohistochemistry, Ki-67 12%.  (The 1% progesterone receptor positivity is best read as an artifact as the progesterone receptor does not function in the absence of a functional estrogen receptor).  The patient's subsequent history is as detailed below   PAST MEDICAL HISTORY: Past Medical History:  Diagnosis Date  . Arthritis    oa  . History of kidney stones   . Hypertension     PAST SURGICAL HISTORY: Past Surgical History:  Procedure Laterality Date  . CYSTOSCOPY  yrs ago  . IR IMAGING GUIDED PORT INSERTION  02/16/2020  . TOTAL KNEE ARTHROPLASTY Left 06/05/2016   Procedure: LEFT TOTAL KNEE ARTHROPLASTY;  Surgeon: [name], MD;  Location: [facility];  Service: Orthopedics;  Laterality: Left;    FAMILY HISTORY No family history on file.  The patient has no information on her father.  Her mother died at the age of 68.  She  had 1 sister and 2 brothers.  No one on that side of the family is known to have had cancer.  The patient herself had 5/2 siblings, 2 sisters and 3 brothers, none with a history of cancer.   GYNECOLOGIC HISTORY:  No LMP recorded. Patient is postmenopausal. Menarche: 74 years old Age at first live birth: 74 years old [place] P 2 LMP mid 10s Contraceptive no HRT  no  Hysterectomy?  No Salpingo-oophorectomy?  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  She is divorced and her former husband subsequently died.  She lives by herself with no pets.  Her son Donna [name] 30 is a Production manager and travels a great deal as a Optometrist.  Daughter Donna [name] teaches middle school in [place].  The patient has 4 grandchildren, no great-grandchildren.  She is a Psychologist, forensic.    ADVANCED DIRECTIVES: Not in place   HEALTH MAINTENANCE: Social History   Tobacco Use  . Smoking status: Never Smoker  . Smokeless tobacco: Never Used  Substance Use Topics  . Alcohol use: No  . Drug use: No     Colonoscopy: Never  PAP: Remote  Bone density: Remote   Allergies  Allergen Reactions  . Benazepril Swelling    Patient had severe angioedema on 10/05/2015.    Current Outpatient Medications  Medication Sig Dispense Refill  . amLODipine (NORVASC) 10 MG tablet Take 10 mg by mouth daily after lunch.     Marland Kitchen aspirin 81 MG chewable tablet Chew 1 tablet (81 mg total) by mouth 2 (two) times daily. Take for 4 weeks. 60 tablet 0  . chlorthalidone (HYGROTON) 25 MG tablet Take 25 mg by mouth daily after lunch.     . Cholecalciferol (VITAMIN D) 2000 units CAPS Take 2,000 Units by mouth daily after lunch.    . dexamethasone (DECADRON) 4 MG tablet Take 2 tablets by mouth daily starting the day after Carboplatin and Cytoxan x 3 days. Take with food. 30 tablet 1  . lidocaine-prilocaine (EMLA) cream Apply to affected area once 30 g 3  . loratadine (CLARITIN) 10 MG tablet Take 1 tablet (10 mg total) by mouth daily. Starting the day after chemotherapy for 10 days, then as needed. 30 tablet 3  . losartan (COZAAR) 50 MG tablet Take 50 mg by mouth daily after lunch.    . Multiple Vitamins-Minerals (CENTRUM ADULTS) TABS 1 tab(s)    . potassium chloride (MICRO-K) 10 MEQ CR capsule Take 10 mEq by mouth 2 (two) times daily.    . prochlorperazine  (COMPAZINE) 10 MG tablet Take 1 tablet (10 mg total) by mouth every 6 (six) hours as needed (Nausea or vomiting). 30 tablet 1   Current Facility-Administered Medications  Medication Dose Route Frequency Provider Last Rate Last Admin  . sodium chloride flush (NS) 0.9 % injection 10 mL  10 mL Intracatheter PRN Magrinat, [name], MD   10 mL at 03/22/20 0919    OBJECTIVE: African-American woman in no acute distress Vitals:   03/22/20 0856  BP: 139/85  Pulse: 95  Resp: 18  Temp: 97.7 F (36.5 C)  SpO2: 100%     Body mass index is 27.6 kg/m.   Wt Readings from Last 3 Encounters:  03/22/20 155 lb 12.8 oz (70.7 kg)  03/08/20 157 lb 1.6 oz (71.3 kg)  02/29/20 157 lb 3.2 oz (71.3 kg)      ECOG FS:1 - Symptomatic but completely ambulatory  Sclerae  unicteric, EOMs intact Wearing a mask No cervical or supraclavicular adenopathy Lungs no rales or rhonchi Heart regular rate and rhythm Abd soft, nontender, positive bowel sounds MSK no focal spinal tenderness, no upper extremity lymphedema Neuro: nonfocal, well oriented, appropriate affect Breasts: Deferred    Left breast 02/04/2020    LAB RESULTS:  CMP     Component Value Date/Time   NA 138 03/08/2020 0906   K 3.6 03/08/2020 0906   CL 104 03/08/2020 0906   CO2 29 03/08/2020 0906   GLUCOSE 209 (H) 03/08/2020 0906   BUN 12 03/08/2020 0906   CREATININE 0.95 03/08/2020 0906   CALCIUM 10.8 (H) 03/08/2020 0906   PROT 7.3 03/08/2020 0906   ALBUMIN 3.4 (L) 03/08/2020 0906   AST 12 (L) 03/08/2020 0906   ALT 14 03/08/2020 0906   ALKPHOS 109 03/08/2020 0906   BILITOT <0.2 (L) 03/08/2020 0906   GFRNONAA 60 (L) 03/08/2020 0906   GFRAA >60 03/08/2020 0906    No results found for: TOTALPROTELP, ALBUMINELP, A1GS, A2GS, BETS, BETA2SER, GAMS, MSPIKE, SPEI  No results found for: Nils Pyle, [facility]  Lab Results  Component Value Date   WBC 10.8 (H) 03/22/2020   NEUTROABS 8.1 (H) 03/22/2020   HGB 12.5 03/22/2020    HCT 38.5 03/22/2020   MCV 88.9 03/22/2020   PLT 227 03/22/2020      Chemistry      Component Value Date/Time   NA 138 03/08/2020 0906   K 3.6 03/08/2020 0906   CL 104 03/08/2020 0906   CO2 29 03/08/2020 0906   BUN 12 03/08/2020 0906   CREATININE 0.95 03/08/2020 0906      Component Value Date/Time   CALCIUM 10.8 (H) 03/08/2020 0906   ALKPHOS 109 03/08/2020 0906   AST 12 (L) 03/08/2020 0906   ALT 14 03/08/2020 0906   BILITOT <0.2 (L) 03/08/2020 0906       No results found for: LABCA2  No components found for: RJJOAC166  No results for input(s): INR in the last 168 hours.  No results found for: LABCA2  No results found for: AYT016  No results found for: WFU932  No results found for: TFT732  No results found for: CA2729  No components found for: HGQUANT  No results found for: CEA1 / No results found for: CEA1   No results found for: AFPTUMOR  No results found for: CHROMOGRNA  No results found for: HGBA, HGBA2QUANT, HGBFQUANT, HGBSQUAN (Hemoglobinopathy evaluation)   No results found for: LDH  No results found for: IRON, TIBC, IRONPCTSAT (Iron and TIBC)  No results found for: FERRITIN  Urinalysis No results found for: COLORURINE, APPEARANCEUR, LABSPEC, PHURINE, GLUCOSEU, HGBUR, BILIRUBINUR, KETONESUR, PROTEINUR, UROBILINOGEN, NITRITE, LEUKOCYTESUR   STUDIES: No results found.  ELIGIBLE FOR AVAILABLE RESEARCH PROTOCOL: no  ASSESSMENT: 74 y.o. [place] woman status post left breast upper inner quadrant biopsy 01/15/2020 for a clinical T2 NX, stage IIB anaplastic carcinoma, triple negative, with an MIB-1 of 12%  (a) bone scan and CT chest 02/12/2020 show no evidence of metastatic disease  (1) neoadjuvant chemotherapy with doxorubicin and cyclophosphamide in dose dense fashion x4 started 02/23/2020, to be followed by weekly paclitaxel and carboplatin x12  (a) echo 0903 shows an ejection fraction in the 60-65% range  (2) definitive  surgery to follow  (3) adjuvant radiation as appropriate   PLAN: Donna [name] will proceed to the third of 4 planned cycles of dose dense AC chemotherapy today.  She came in a little bit late so  I do not yet have her counts--her labs are just being drawn.  Of course we will have to wait until those results before actually proceeding with treatment  She will then return to see me in 14 days.  That will be her last [facility] treatment and we will set her up for her 12 weekly doses after that  They know to call for any other problems that may develop before the next visit  Total encounter time 25 minutes.Chauncey Cruel, MD   03/22/2020 9:21 AM Medical Oncology and Hematology [facility] [place],  [address] Tel. [phone]    Fax. [phone]  Addendum: CBC is fine and we are proceeding with treatment today.   I, Wilburn Mylar, am acting as scribe for Dr. Sarajane Jews C. Magrinat.  I, Lurline Del MD, have reviewed the above documentation for accuracy and completeness, and I agree with the above.   *Total Encounter Time as defined by the Centers for Medicare and Medicaid Services includes, in addition to the face-to-face time of a patient visit (documented in the note above) non-face-to-face time: obtaining and reviewing outside history, ordering and reviewing medications, tests or procedures, care coordination (communications with other health care professionals or caregivers) and documentation in the medical record.

## 2020-03-23 ENCOUNTER — Telehealth: Payer: Self-pay | Admitting: Oncology

## 2020-03-23 NOTE — Telephone Encounter (Signed)
No 10/12 los, no changes made to pt schedule   

## 2020-03-24 ENCOUNTER — Other Ambulatory Visit: Payer: Self-pay

## 2020-03-24 ENCOUNTER — Inpatient Hospital Stay: Payer: Medicare PPO

## 2020-03-24 VITALS — BP 127/89 | HR 76 | Temp 98.7°F | Resp 18

## 2020-03-24 DIAGNOSIS — C50212 Malignant neoplasm of upper-inner quadrant of left female breast: Secondary | ICD-10-CM

## 2020-03-24 DIAGNOSIS — Z5111 Encounter for antineoplastic chemotherapy: Secondary | ICD-10-CM | POA: Diagnosis not present

## 2020-03-24 DIAGNOSIS — C50919 Malignant neoplasm of unspecified site of unspecified female breast: Secondary | ICD-10-CM

## 2020-03-24 DIAGNOSIS — Z171 Estrogen receptor negative status [ER-]: Secondary | ICD-10-CM

## 2020-03-24 MED ORDER — PEGFILGRASTIM-JMDB 6 MG/0.6ML ~~LOC~~ SOSY
PREFILLED_SYRINGE | SUBCUTANEOUS | Status: AC
  Filled 2020-03-24: qty 0.6

## 2020-03-24 MED ORDER — PEGFILGRASTIM-JMDB 6 MG/0.6ML ~~LOC~~ SOSY
6.0000 mg | PREFILLED_SYRINGE | Freq: Once | SUBCUTANEOUS | Status: AC
  Administered 2020-03-24: 6 mg via SUBCUTANEOUS

## 2020-03-24 NOTE — Patient Instructions (Signed)

## 2020-03-31 ENCOUNTER — Encounter: Payer: Self-pay | Admitting: *Deleted

## 2020-04-04 NOTE — Progress Notes (Signed)
[place]  Telephone:([phone] Fax:(336) [phone]     ID: Donna [name] DOB: 1945-08-18  MR#: [id number]  PRF#:[id number]  Patient Care Team: Montez Morita, PA-C as PCP - General (Hematology and Oncology) Sydny Schnitzler, [name], MD as Consulting Physician (Oncology) [name], MD as Consulting Physician (General Surgery) [name], MD as Consulting Physician (Orthopedic Surgery) [name], RN as Oncology Nurse Navigator [name], RN as Oncology Nurse Navigator Chauncey Cruel, MD OTHER MD: [name] MD ([facility]); [name] NP   CHIEF COMPLAINT: Triple negative breast cancer  CURRENT TREATMENT: neoadjuvant chemotherapy   INTERVAL HISTORY: Donna [name] returns today for follow up and treatment of her triple negative breast cancer accompanied by her daughter.  She began neoadjuvant chemotherapy, consisting of cyclophosphamide and doxorubicin with pegfilgrastim on day 3, on 02/23/2020. Today is day 1 cycle 4.  This will be followed by carboplatin and paclitaxel weekly x12.   REVIEW OF SYSTEMS: Donna [name] continues to tolerate treatment remarkably well.  She feels a little bit tired and only takes little walks but is doing some cooking and some housecleaning.  She does have altered taste and we discussed some strategies to improve that today.  Otherwise a detailed review of systems today was stable   HISTORY OF CURRENT ILLNESS: From the original intake note:  Dezyrae herself palpated a mass in her left breast during showering.  She brought this to medical attention and on 12/16/2019 underwent mammography, the results of which I do not have today.  She proceeded to left breast ultrasound showing breast density B.  There was a mass in the upper inner quadrant of the left breast at the 10 o'clock position 7 cm from the nipple.  It measured 3.26 cm.  There are no ultrasound comments regarding the axilla.  Biopsy of the mass in question was  performed 01/15/2020 at [place] in [facility] and the pathology report describes a biphasic tumor with malignant epithelial component and a mesenchymal component.  The epithelial component appears to squamoid and stain strongly for CK 8/18 by immunohistochemistry.  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  The tumor was essentially triple negative, estrogen receptor 0%, progesterone receptor 1% with 1+ intensity, HER-2 1+ by immunohistochemistry, Ki-67 12%.  (The 1% progesterone receptor positivity is best read as an artifact as the progesterone receptor does not function in the absence of a functional estrogen receptor).  The patient's subsequent history is as detailed below   PAST MEDICAL HISTORY: Past Medical History:  Diagnosis Date  . Arthritis    oa  . History of kidney stones   . Hypertension     PAST SURGICAL HISTORY: Past Surgical History:  Procedure Laterality Date  . CYSTOSCOPY  yrs ago  . IR IMAGING GUIDED PORT INSERTION  02/16/2020  . TOTAL KNEE ARTHROPLASTY Left 06/05/2016   Procedure: LEFT TOTAL KNEE ARTHROPLASTY;  Surgeon: [name], MD;  Location: [facility];  Service: Orthopedics;  Laterality: Left;    FAMILY HISTORY No family history on file.  The patient has no information on her father.  Her mother died at the age of 71.  She had 1 sister and 2 brothers.  No one on that side of the family is known to have had cancer.  The patient herself had 5/2 siblings, 2 sisters and 3 brothers, none with a history of cancer.   GYNECOLOGIC HISTORY:  No LMP recorded. Patient  is postmenopausal. Menarche: 74 years old Age at first live birth: 74 years old [place] P 2 LMP mid 71s Contraceptive no HRT no  Hysterectomy?  No Salpingo-oophorectomy?  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  She is divorced and her former husband subsequently  died.  She lives by herself with no pets.  Her son Donna [name] 43 is a Production manager and travels a great deal as a Optometrist.  Daughter Donna [name] teaches middle school in [place].  The patient has 4 grandchildren, no great-grandchildren.  She is a Psychologist, forensic.    ADVANCED DIRECTIVES: Not in place   HEALTH MAINTENANCE: Social History   Tobacco Use  . Smoking status: Never Smoker  . Smokeless tobacco: Never Used  Substance Use Topics  . Alcohol use: No  . Drug use: No     Colonoscopy: Never  PAP: Remote  Bone density: Remote   Allergies  Allergen Reactions  . Benazepril Swelling    Patient had severe angioedema on 10/05/2015.    Current Outpatient Medications  Medication Sig Dispense Refill  . amLODipine (NORVASC) 10 MG tablet Take 10 mg by mouth daily after lunch.     Marland Kitchen aspirin 81 MG chewable tablet Chew 1 tablet (81 mg total) by mouth 2 (two) times daily. Take for 4 weeks. 60 tablet 0  . chlorthalidone (HYGROTON) 25 MG tablet Take 25 mg by mouth daily after lunch.     . Cholecalciferol (VITAMIN D) 2000 units CAPS Take 2,000 Units by mouth daily after lunch.    . dexamethasone (DECADRON) 4 MG tablet Take 2 tablets by mouth daily starting the day after Carboplatin and Cytoxan x 3 days. Take with food. 30 tablet 1  . lidocaine-prilocaine (EMLA) cream Apply to affected area once 30 g 3  . loratadine (CLARITIN) 10 MG tablet Take 1 tablet (10 mg total) by mouth daily. Starting the day after chemotherapy for 10 days, then as needed. 30 tablet 3  . losartan (COZAAR) 50 MG tablet Take 50 mg by mouth daily after lunch.    . Multiple Vitamins-Minerals (CENTRUM ADULTS) TABS 1 tab(s)    . potassium chloride (MICRO-K) 10 MEQ CR capsule Take 10 mEq by mouth 2 (two) times daily.    . prochlorperazine (COMPAZINE) 10 MG tablet Take 1 tablet (10 mg total) by mouth every 6 (six) hours as needed (Nausea or vomiting). 30 tablet 1   No current facility-administered medications for this visit.    Facility-Administered Medications Ordered in Other Visits  Medication Dose Route Frequency Provider Last Rate Last Admin  . sodium chloride flush (NS) 0.9 % injection 10 mL  10 mL Intracatheter PRN Tehillah Cipriani, [name], MD   10 mL at 04/05/20 0828    OBJECTIVE: African-American woman who appears stated age 30:   04/05/20 0831  BP: 139/78  Pulse: 100  Resp: 18  Temp: (!) 97.5 F (36.4 C)  SpO2: 100%     Body mass index is 27.56 kg/m.   Wt Readings from Last 3 Encounters:  04/05/20 155 lb 9.6 oz (70.6 kg)  03/22/20 155 lb 12.8 oz (70.7 kg)  03/08/20 157 lb 1.6 oz (71.3 kg)      ECOG FS:1 - Symptomatic but completely ambulatory  Sclerae unicteric, EOMs intact Wearing a mask No cervical or supraclavicular adenopathy Lungs no rales or rhonchi Heart regular rate and rhythm Abd soft, nontender, positive bowel sounds MSK no focal spinal tenderness, no upper extremity  lymphedema Neuro: nonfocal, well oriented, appropriate affect Breasts: The right breast is unremarkable.  The left breast has a palpable mass which does not appear significantly changed from baseline.  It is not larger but it is also not smaller or softer.  It is movable.  The left axilla is benign.   Left breast 02/04/2020    LAB RESULTS:  CMP     Component Value Date/Time   NA 138 03/22/2020 0900   K 3.2 (L) 03/22/2020 0900   CL 104 03/22/2020 0900   CO2 27 03/22/2020 0900   GLUCOSE 205 (H) 03/22/2020 0900   BUN 12 03/22/2020 0900   CREATININE 0.92 03/22/2020 0900   CALCIUM 10.9 (H) 03/22/2020 0900   PROT 7.0 03/22/2020 0900   ALBUMIN 3.4 (L) 03/22/2020 0900   AST 11 (L) 03/22/2020 0900   ALT 12 03/22/2020 0900   ALKPHOS 100 03/22/2020 0900   BILITOT <0.2 (L) 03/22/2020 0900   GFRNONAA >60 03/22/2020 0900   GFRAA >60 03/08/2020 0906    No results found for: TOTALPROTELP, ALBUMINELP, A1GS, A2GS, BETS, BETA2SER, GAMS, MSPIKE, SPEI  No results found for: Nils Pyle,  [facility]  Lab Results  Component Value Date   WBC 11.7 (H) 04/05/2020   NEUTROABS 9.1 (H) 04/05/2020   HGB 12.5 04/05/2020   HCT 37.9 04/05/2020   MCV 89.4 04/05/2020   PLT 207 04/05/2020      Chemistry      Component Value Date/Time   NA 138 03/22/2020 0900   K 3.2 (L) 03/22/2020 0900   CL 104 03/22/2020 0900   CO2 27 03/22/2020 0900   BUN 12 03/22/2020 0900   CREATININE 0.92 03/22/2020 0900      Component Value Date/Time   CALCIUM 10.9 (H) 03/22/2020 0900   ALKPHOS 100 03/22/2020 0900   AST 11 (L) 03/22/2020 0900   ALT 12 03/22/2020 0900   BILITOT <0.2 (L) 03/22/2020 0900       No results found for: LABCA2  No components found for: LPNPYY511  No results for input(s): INR in the last 168 hours.  No results found for: LABCA2  No results found for: MYT117  No results found for: BVA701  No results found for: IDC301  No results found for: CA2729  No components found for: HGQUANT  No results found for: CEA1 / No results found for: CEA1   No results found for: AFPTUMOR  No results found for: CHROMOGRNA  No results found for: HGBA, HGBA2QUANT, HGBFQUANT, HGBSQUAN (Hemoglobinopathy evaluation)   No results found for: LDH  No results found for: IRON, TIBC, IRONPCTSAT (Iron and TIBC)  No results found for: FERRITIN  Urinalysis No results found for: COLORURINE, APPEARANCEUR, LABSPEC, PHURINE, GLUCOSEU, HGBUR, BILIRUBINUR, KETONESUR, PROTEINUR, UROBILINOGEN, NITRITE, LEUKOCYTESUR   STUDIES: No results found.  ELIGIBLE FOR AVAILABLE RESEARCH PROTOCOL: no  ASSESSMENT: 74 y.o. [place] woman status post left breast upper inner quadrant biopsy 01/15/2020 for a clinical T2 NX, stage IIB anaplastic carcinoma, triple negative, with an MIB-1 of 12%  (a) bone scan and CT chest 02/12/2020 show no evidence of metastatic disease  (1) neoadjuvant chemotherapy with doxorubicin and cyclophosphamide in dose dense fashion x4 started 02/23/2020, to  be followed by weekly paclitaxel and carboplatin x12  (a) echo 0903 shows an ejection fraction in the 60-65% range  (2) definitive surgery to follow  (3) adjuvant radiation as appropriate   PLAN: Kierah completes the more intense portion of her chemotherapy today.  She has done remarkably well  She will start carboplatin and paclitaxel weekly 04/19/2020.  She will see me that day and the following week after which she will be seen every 2 weeks until she completes treatment.  By clinic call follow-up the cancer has not significantly responded.  Of course the main purpose of chemotherapy is to sterilize systemic disease.  She will in any case have a change in chemo beginning in 2 weeks and we discussed particularly issues relating to her nails and neuropathy today  They know to call for any other problem that may develop before the next visit  Total encounter time 25 minutes.Chauncey Cruel, MD   04/05/2020 8:50 AM Medical Oncology and Hematology [facility] [place],  [address] Tel. [phone]    Fax. [phone]   I, Wilburn Mylar, am acting as scribe for Dr. [name]. Averly Ericson.  I, Lurline Del MD, have reviewed the above documentation for accuracy and completeness, and I agree with the above.   *Total Encounter Time as defined by the Centers for Medicare and Medicaid Services includes, in addition to the face-to-face time of a patient visit (documented in the note above) non-face-to-face time: obtaining and reviewing outside history, ordering and reviewing medications, tests or procedures, care coordination (communications with other health care professionals or caregivers) and documentation in the medical record.

## 2020-04-05 ENCOUNTER — Inpatient Hospital Stay: Payer: Medicare PPO

## 2020-04-05 ENCOUNTER — Other Ambulatory Visit: Payer: Self-pay

## 2020-04-05 ENCOUNTER — Encounter: Payer: Self-pay | Admitting: *Deleted

## 2020-04-05 ENCOUNTER — Telehealth: Payer: Self-pay | Admitting: *Deleted

## 2020-04-05 ENCOUNTER — Inpatient Hospital Stay (HOSPITAL_BASED_OUTPATIENT_CLINIC_OR_DEPARTMENT_OTHER): Payer: Medicare PPO | Admitting: Oncology

## 2020-04-05 ENCOUNTER — Other Ambulatory Visit: Payer: Medicare Other

## 2020-04-05 ENCOUNTER — Telehealth: Payer: Self-pay

## 2020-04-05 VITALS — BP 139/78 | HR 100 | Temp 97.5°F | Resp 18 | Ht 63.0 in | Wt 155.6 lb

## 2020-04-05 DIAGNOSIS — Z5111 Encounter for antineoplastic chemotherapy: Secondary | ICD-10-CM | POA: Diagnosis not present

## 2020-04-05 DIAGNOSIS — Z171 Estrogen receptor negative status [ER-]: Secondary | ICD-10-CM

## 2020-04-05 DIAGNOSIS — Z95828 Presence of other vascular implants and grafts: Secondary | ICD-10-CM

## 2020-04-05 DIAGNOSIS — C50212 Malignant neoplasm of upper-inner quadrant of left female breast: Secondary | ICD-10-CM | POA: Diagnosis not present

## 2020-04-05 DIAGNOSIS — C50919 Malignant neoplasm of unspecified site of unspecified female breast: Secondary | ICD-10-CM | POA: Diagnosis not present

## 2020-04-05 LAB — CBC WITH DIFFERENTIAL (CANCER CENTER ONLY)
Abs Immature Granulocytes: 0.53 10*3/uL — ABNORMAL HIGH (ref 0.00–0.07)
Basophils Absolute: 0.1 10*3/uL (ref 0.0–0.1)
Basophils Relative: 1 %
Eosinophils Absolute: 0 10*3/uL (ref 0.0–0.5)
Eosinophils Relative: 0 %
HCT: 37.9 % (ref 36.0–46.0)
Hemoglobin: 12.5 g/dL (ref 12.0–15.0)
Immature Granulocytes: 5 %
Lymphocytes Relative: 11 %
Lymphs Abs: 1.3 10*3/uL (ref 0.7–4.0)
MCH: 29.5 pg (ref 26.0–34.0)
MCHC: 33 g/dL (ref 30.0–36.0)
MCV: 89.4 fL (ref 80.0–100.0)
Monocytes Absolute: 0.7 10*3/uL (ref 0.1–1.0)
Monocytes Relative: 6 %
Neutro Abs: 9.1 10*3/uL — ABNORMAL HIGH (ref 1.7–7.7)
Neutrophils Relative %: 77 %
Platelet Count: 207 10*3/uL (ref 150–400)
RBC: 4.24 MIL/uL (ref 3.87–5.11)
RDW: 17.7 % — ABNORMAL HIGH (ref 11.5–15.5)
WBC Count: 11.7 10*3/uL — ABNORMAL HIGH (ref 4.0–10.5)
nRBC: 0.5 % — ABNORMAL HIGH (ref 0.0–0.2)

## 2020-04-05 LAB — CMP (CANCER CENTER ONLY)
ALT: 14 U/L (ref 0–44)
AST: 19 U/L (ref 15–41)
Albumin: 3.6 g/dL (ref 3.5–5.0)
Alkaline Phosphatase: 112 U/L (ref 38–126)
Anion gap: 7 (ref 5–15)
BUN: 16 mg/dL (ref 8–23)
CO2: 25 mmol/L (ref 22–32)
Calcium: 10.8 mg/dL — ABNORMAL HIGH (ref 8.9–10.3)
Chloride: 104 mmol/L (ref 98–111)
Creatinine: 0.87 mg/dL (ref 0.44–1.00)
GFR, Estimated: >60 mL/min (ref >60)
Glucose, Bld: 183 mg/dL — ABNORMAL HIGH (ref 70–99)
Potassium: 3.2 mmol/L — ABNORMAL LOW (ref 3.5–5.1)
Sodium: 136 mmol/L (ref 135–145)
Total Bilirubin: 0.3 mg/dL (ref 0.3–1.2)
Total Protein: 6.9 g/dL (ref 6.5–8.1)

## 2020-04-05 MED ORDER — SODIUM CHLORIDE 0.9 % IV SOLN
10.0000 mg | Freq: Once | INTRAVENOUS | Status: AC
  Administered 2020-04-05: 10 mg via INTRAVENOUS
  Filled 2020-04-05: qty 10

## 2020-04-05 MED ORDER — SODIUM CHLORIDE 0.9 % IV SOLN
150.0000 mg | Freq: Once | INTRAVENOUS | Status: AC
  Administered 2020-04-05: 150 mg via INTRAVENOUS
  Filled 2020-04-05: qty 150

## 2020-04-05 MED ORDER — SODIUM CHLORIDE 0.9% FLUSH
10.0000 mL | INTRAVENOUS | Status: DC | PRN
  Administered 2020-04-05: 10 mL
  Filled 2020-04-05: qty 10

## 2020-04-05 MED ORDER — SODIUM CHLORIDE 0.9 % IV SOLN
600.0000 mg/m2 | Freq: Once | INTRAVENOUS | Status: AC
  Administered 2020-04-05: 1080 mg via INTRAVENOUS
  Filled 2020-04-05: qty 54

## 2020-04-05 MED ORDER — PALONOSETRON HCL INJECTION 0.25 MG/5ML
0.2500 mg | Freq: Once | INTRAVENOUS | Status: AC
  Administered 2020-04-05: 0.25 mg via INTRAVENOUS

## 2020-04-05 MED ORDER — HEPARIN SOD (PORK) LOCK FLUSH 100 UNIT/ML IV SOLN
500.0000 [IU] | Freq: Once | INTRAVENOUS | Status: AC | PRN
  Administered 2020-04-05: 500 [IU]
  Filled 2020-04-05: qty 5

## 2020-04-05 MED ORDER — PALONOSETRON HCL INJECTION 0.25 MG/5ML
INTRAVENOUS | Status: AC
  Filled 2020-04-05: qty 5

## 2020-04-05 MED ORDER — DOXORUBICIN HCL CHEMO IV INJECTION 2 MG/ML
60.0000 mg/m2 | Freq: Once | INTRAVENOUS | Status: AC
  Administered 2020-04-05: 108 mg via INTRAVENOUS
  Filled 2020-04-05: qty 54

## 2020-04-05 MED ORDER — SODIUM CHLORIDE 0.9 % IV SOLN
Freq: Once | INTRAVENOUS | Status: AC
  Filled 2020-04-05: qty 250

## 2020-04-05 NOTE — Telephone Encounter (Signed)
Called and spoke with daughter. Given below message. Reviewed high potassium food list. She verbalized understanding.

## 2020-04-05 NOTE — Patient Instructions (Signed)
[facility] Discharge Instructions for Patients Receiving Chemotherapy  Today you received the following chemotherapy agents Adriamycin and Cytoxan  To help prevent nausea and vomiting after your treatment, we encourage you to take your nausea medication as directed.  If you develop nausea and vomiting that is not controlled by your nausea medication, call the clinic.   BELOW ARE SYMPTOMS THAT SHOULD BE REPORTED IMMEDIATELY:  *FEVER GREATER THAN 100.5 F  *CHILLS WITH OR WITHOUT FEVER  NAUSEA AND VOMITING THAT IS NOT CONTROLLED WITH YOUR NAUSEA MEDICATION  *UNUSUAL SHORTNESS OF BREATH  *UNUSUAL BRUISING OR BLEEDING  TENDERNESS IN MOUTH AND THROAT WITH OR WITHOUT PRESENCE OF ULCERS  *URINARY PROBLEMS  *BOWEL PROBLEMS  UNUSUAL RASH Items with * indicate a potential emergency and should be followed up as soon as possible.  Feel free to call the clinic should you have any questions or concerns. The clinic phone number is [phone].  Please show the CHEMO ALERT CARD at check-in to the Emergency Department and triage nurse.   

## 2020-04-05 NOTE — Telephone Encounter (Signed)
Error

## 2020-04-05 NOTE — Telephone Encounter (Signed)
-----   Message from Gardenia Phlegm, NP sent at 04/05/2020  9:33 AM EDT ----- Potassium slightly low, please recommend increased potassium rich foods. ----- Message ----- From: Buel Ream, Lab In [place] Sent: 04/05/2020   8:34 AM EDT To: Chauncey Cruel, MD

## 2020-04-07 ENCOUNTER — Encounter: Payer: Self-pay | Admitting: *Deleted

## 2020-04-07 ENCOUNTER — Inpatient Hospital Stay: Payer: Medicare PPO

## 2020-04-07 ENCOUNTER — Other Ambulatory Visit: Payer: Self-pay

## 2020-04-07 VITALS — BP 123/66 | HR 85 | Resp 18

## 2020-04-07 DIAGNOSIS — Z5111 Encounter for antineoplastic chemotherapy: Secondary | ICD-10-CM | POA: Diagnosis not present

## 2020-04-07 DIAGNOSIS — C50212 Malignant neoplasm of upper-inner quadrant of left female breast: Secondary | ICD-10-CM

## 2020-04-07 DIAGNOSIS — Z171 Estrogen receptor negative status [ER-]: Secondary | ICD-10-CM

## 2020-04-07 DIAGNOSIS — C50919 Malignant neoplasm of unspecified site of unspecified female breast: Secondary | ICD-10-CM

## 2020-04-07 MED ORDER — PEGFILGRASTIM-JMDB 6 MG/0.6ML ~~LOC~~ SOSY
PREFILLED_SYRINGE | SUBCUTANEOUS | Status: AC
  Filled 2020-04-07: qty 0.6

## 2020-04-07 MED ORDER — PEGFILGRASTIM-JMDB 6 MG/0.6ML ~~LOC~~ SOSY
6.0000 mg | PREFILLED_SYRINGE | Freq: Once | SUBCUTANEOUS | Status: AC
  Administered 2020-04-07: 6 mg via SUBCUTANEOUS

## 2020-04-07 NOTE — Patient Instructions (Signed)

## 2020-04-15 ENCOUNTER — Other Ambulatory Visit: Payer: Self-pay | Admitting: Oncology

## 2020-04-18 NOTE — Progress Notes (Signed)
[facility]  Telephone:([phone]) [phone] Fax:(336) [phone]     ID: Donna [name] DOB: June 30, 1945  MR#: [id number]  FYB#:[id number]  Patient Care Team: Montez Morita, PA-C as PCP - General (Hematology and Oncology) Nolen Lindamood, [name], MD as Consulting Physician (Oncology) [name], MD as Consulting Physician (General Surgery) [name], MD as Consulting Physician (Orthopedic Surgery) [name], RN as Oncology Nurse Navigator [name], RN as Oncology Nurse Navigator Chauncey Cruel, MD OTHER MD: [name] MD ([facility]); [name] NP   CHIEF COMPLAINT: Triple negative breast cancer  CURRENT TREATMENT: neoadjuvant chemotherapy   INTERVAL HISTORY: Donna [name] returns today for follow up and treatment of her triple negative breast cancer.  This is the first time she comes to a treatment by herself.  She says her son this could not make it today.  She completed the first part of her neoadjuvant chemotherapy, consisting of cyclophosphamide and doxorubicin with pegfilgrastim on day 3. She is now ready to begin carboplatin and paclitaxel weekly x12.   REVIEW OF SYSTEMS: Donna [name] is back living in her own house by herself which she really enjoys.  She is driving, shopping, doing housework, and cooking.  She has no peripheral neuropathy symptoms.  She tolerated the early part of her chemotherapy remarkably well   COVID 19 VACCINATION STATUS: s/p Moderna x2   HISTORY OF CURRENT ILLNESS: From the original intake note:  Donna [name] herself palpated a mass in her left breast during showering.  She brought this to medical attention and on 12/16/2019 underwent mammography, the results of which I do not have today.  She proceeded to left breast ultrasound showing breast density B.  There was a mass in the upper inner quadrant of the left breast at the 10 o'clock position 7 cm from the nipple.  It measured 3.26 cm.  There are no ultrasound comments regarding  the axilla.  Biopsy of the mass in question was performed 01/15/2020 at [facility] and the pathology report describes a biphasic tumor with malignant epithelial component and a mesenchymal component.  The epithelial component appears to squamoid and stain strongly for CK 8/18 by immunohistochemistry.  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  The tumor was essentially triple negative, estrogen receptor 0%, progesterone receptor 1% with 1+ intensity, HER-2 1+ by immunohistochemistry, Ki-67 12%.  (The 1% progesterone receptor positivity is best read as an artifact as the progesterone receptor does not function in the absence of a functional estrogen receptor).  The patient's subsequent history is as detailed below   PAST MEDICAL HISTORY: Past Medical History:  Diagnosis Date  . Arthritis    oa  . History of kidney stones   . Hypertension     PAST SURGICAL HISTORY: Past Surgical History:  Procedure Laterality Date  . CYSTOSCOPY  yrs ago  . IR IMAGING GUIDED PORT INSERTION  02/16/2020  . TOTAL KNEE ARTHROPLASTY Left 06/05/2016   Procedure: LEFT TOTAL KNEE ARTHROPLASTY;  Surgeon: [name], MD;  Location: [facility];  Service: Orthopedics;  Laterality: Left;    FAMILY HISTORY No family history on file.  The patient has no information on her father.  Her mother died at the age of 59.  She had 1 sister and 2 brothers.  No one on that side of the family is known to have had cancer.  The patient herself had 5/2 siblings, 2 sisters and 3 brothers,  none with a history of cancer.   GYNECOLOGIC HISTORY:  No LMP recorded. Patient is postmenopausal. Menarche: 74 years old Age at first live birth: 74 years old GX P 2 LMP mid 73s Contraceptive no HRT no  Hysterectomy?  No Salpingo-oophorectomy?  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   She is divorced and her former husband subsequently died.  She lives by herself with no pets.  Her son [name] 14 is a Publishing copy and travels a great deal as a Research scientist (medical).  Daughter [name] teaches middle school in [place].  The patient has 4 grandchildren, no great-grandchildren.  She is a Control and instrumentation engineer.    ADVANCED DIRECTIVES: Not in place   HEALTH MAINTENANCE: Social History   Tobacco Use  . Smoking status: Never Smoker  . Smokeless tobacco: Never Used  Substance Use Topics  . Alcohol use: No  . Drug use: No     Colonoscopy: Never  PAP: Remote  Bone density: Remote   Allergies  Allergen Reactions  . Benazepril Swelling    Patient had severe angioedema on 10/05/2015.    Current Outpatient Medications  Medication Sig Dispense Refill  . amLODipine (NORVASC) 10 MG tablet Take 10 mg by mouth daily after lunch.     Marland Kitchen aspirin 81 MG chewable tablet Chew 1 tablet (81 mg total) by mouth 2 (two) times daily. Take for 4 weeks. 60 tablet 0  . chlorthalidone (HYGROTON) 25 MG tablet Take 25 mg by mouth daily after lunch.     . Cholecalciferol (VITAMIN D) 2000 units CAPS Take 2,000 Units by mouth daily after lunch.    . dexamethasone (DECADRON) 4 MG tablet Take 2 tablets by mouth daily starting the day after Carboplatin and Cytoxan x 3 days. Take with food. 30 tablet 1  . lidocaine-prilocaine (EMLA) cream Apply to affected area once 30 g 3  . loratadine (CLARITIN) 10 MG tablet Take 1 tablet (10 mg total) by mouth daily. Starting the day after chemotherapy for 10 days, then as needed. 30 tablet 3  . losartan (COZAAR) 50 MG tablet Take 50 mg by mouth daily after lunch.    . Multiple Vitamins-Minerals (CENTRUM ADULTS) TABS 1 tab(s)    . potassium chloride (MICRO-K) 10 MEQ CR capsule Take 10 mEq by mouth 2 (two) times daily.    . prochlorperazine (COMPAZINE) 10 MG tablet Take 1 tablet (10 mg total) by mouth every 6 (six) hours as needed (Nausea or vomiting). 30 tablet 1   No current  facility-administered medications for this visit.    OBJECTIVE: African-American woman in no acute distress Vitals:   04/19/20 0836  BP: 128/81  Pulse: 100  Resp: 18  Temp: 98.3 F (36.8 C)  SpO2: 98%     Body mass index is 27.63 kg/m.   Wt Readings from Last 3 Encounters:  04/19/20 156 lb (70.8 kg)  04/05/20 155 lb 9.6 oz (70.6 kg)  03/22/20 155 lb 12.8 oz (70.7 kg)      ECOG FS:1 - Symptomatic but completely ambulatory  Sclerae unicteric, EOMs intact Wearing a mask No cervical or supraclavicular adenopathy Lungs no rales or rhonchi Heart regular rate and rhythm Abd soft, nontender, positive bowel sounds MSK no focal spinal tenderness, no upper extremity lymphedema Neuro: nonfocal, well oriented, appropriate affect Breasts: Deferred  Left breast 02/04/2020    LAB RESULTS:  CMP     Component Value Date/Time   NA 138 04/19/2020 0819  K 3.3 (L) 04/19/2020 0819   CL 106 04/19/2020 0819   CO2 26 04/19/2020 0819   GLUCOSE 162 (H) 04/19/2020 0819   BUN 15 04/19/2020 0819   CREATININE 0.92 04/19/2020 0819   CALCIUM 10.5 (H) 04/19/2020 0819   PROT 6.6 04/19/2020 0819   ALBUMIN 3.5 04/19/2020 0819   AST 12 (L) 04/19/2020 0819   ALT 11 04/19/2020 0819   ALKPHOS 106 04/19/2020 0819   BILITOT 0.3 04/19/2020 0819   GFRNONAA >60 04/19/2020 0819   GFRAA >60 03/08/2020 0906    No results found for: TOTALPROTELP, ALBUMINELP, A1GS, A2GS, BETS, BETA2SER, GAMS, MSPIKE, SPEI  No results found for: Nils Pyle, [facility]  Lab Results  Component Value Date   WBC 11.6 (H) 04/19/2020   NEUTROABS 9.1 (H) 04/19/2020   HGB 12.2 04/19/2020   HCT 36.4 04/19/2020   MCV 89.2 04/19/2020   PLT 221 04/19/2020      Chemistry      Component Value Date/Time   NA 138 04/19/2020 0819   K 3.3 (L) 04/19/2020 0819   CL 106 04/19/2020 0819   CO2 26 04/19/2020 0819   BUN 15 04/19/2020 0819   CREATININE 0.92 04/19/2020 0819      Component Value Date/Time    CALCIUM 10.5 (H) 04/19/2020 0819   ALKPHOS 106 04/19/2020 0819   AST 12 (L) 04/19/2020 0819   ALT 11 04/19/2020 0819   BILITOT 0.3 04/19/2020 0819       No results found for: LABCA2  No components found for: VOHYWV371  No results for input(s): INR in the last 168 hours.  No results found for: LABCA2  No results found for: GGY694  No results found for: WNI627  No results found for: OJJ009  No results found for: CA2729  No components found for: HGQUANT  No results found for: CEA1 / No results found for: CEA1   No results found for: AFPTUMOR  No results found for: CHROMOGRNA  No results found for: HGBA, HGBA2QUANT, HGBFQUANT, HGBSQUAN (Hemoglobinopathy evaluation)   No results found for: LDH  No results found for: IRON, TIBC, IRONPCTSAT (Iron and TIBC)  No results found for: FERRITIN  Urinalysis No results found for: COLORURINE, APPEARANCEUR, LABSPEC, PHURINE, GLUCOSEU, HGBUR, BILIRUBINUR, KETONESUR, PROTEINUR, UROBILINOGEN, NITRITE, LEUKOCYTESUR   STUDIES: No results found.   ELIGIBLE FOR AVAILABLE RESEARCH PROTOCOL: no  ASSESSMENT: 74 y.o. [place] woman status post left breast upper inner quadrant biopsy 01/15/2020 for a clinical T2 NX, stage IIB anaplastic carcinoma, triple negative, with an MIB-1 of 12%  (a) bone scan and CT chest 02/12/2020 show no evidence of metastatic disease  (1) neoadjuvant chemotherapy with doxorubicin and cyclophosphamide in dose dense fashion x4 started 02/23/2020, followed by weekly paclitaxel and carboplatin x12 starting 04/19/2020  (a) echo 02/12/2020 shows an ejection fraction in the 60-65% range  (2) definitive surgery to follow  (3) adjuvant radiation as appropriate   PLAN: Donna [name] will start the second portion of her chemotherapy today.  This is more protracted and less intense.  Nevertheless we reviewed the possible toxicities side effects and complications today.  In particular she is going to let us  know if she develops any numbness or tingling in her finger pads or toe pads.  She is getting some hyperpigmentation over the nails.  She understands this is benign and reversible  She will see me again next week and I asked her to keep a diary of symptoms so we can troubleshoot any problems that may develop  Total encounter time 25 minutes.   Chauncey Cruel, MD   04/19/2020 9:20 AM Medical Oncology and Hematology [facility] [place], [place] [address] Tel. [phone]    Fax. [phone]   I, Wilburn Mylar, am acting as scribe for Dr. [name]. Nasri Boakye.  I, Lurline Del MD, have reviewed the above documentation for accuracy and completeness, and I agree with the above.   *Total Encounter Time as defined by the Centers for Medicare and Medicaid Services includes, in addition to the face-to-face time of a patient visit (documented in the note above) non-face-to-face time: obtaining and reviewing outside history, ordering and reviewing medications, tests or procedures, care coordination (communications with other health care professionals or caregivers) and documentation in the medical record.

## 2020-04-19 ENCOUNTER — Inpatient Hospital Stay: Payer: Medicare PPO | Attending: Oncology

## 2020-04-19 ENCOUNTER — Inpatient Hospital Stay (HOSPITAL_BASED_OUTPATIENT_CLINIC_OR_DEPARTMENT_OTHER): Payer: Medicare PPO | Admitting: Oncology

## 2020-04-19 ENCOUNTER — Inpatient Hospital Stay: Payer: Medicare PPO

## 2020-04-19 ENCOUNTER — Other Ambulatory Visit: Payer: Self-pay

## 2020-04-19 VITALS — BP 128/81 | HR 100 | Temp 98.3°F | Resp 18 | Ht 63.0 in | Wt 156.0 lb

## 2020-04-19 VITALS — BP 138/87 | HR 87 | Temp 98.4°F | Resp 16

## 2020-04-19 DIAGNOSIS — Z95828 Presence of other vascular implants and grafts: Secondary | ICD-10-CM

## 2020-04-19 DIAGNOSIS — Z171 Estrogen receptor negative status [ER-]: Secondary | ICD-10-CM | POA: Insufficient documentation

## 2020-04-19 DIAGNOSIS — Z7952 Long term (current) use of systemic steroids: Secondary | ICD-10-CM | POA: Diagnosis not present

## 2020-04-19 DIAGNOSIS — C50919 Malignant neoplasm of unspecified site of unspecified female breast: Secondary | ICD-10-CM | POA: Diagnosis not present

## 2020-04-19 DIAGNOSIS — Z7982 Long term (current) use of aspirin: Secondary | ICD-10-CM | POA: Diagnosis not present

## 2020-04-19 DIAGNOSIS — Z5111 Encounter for antineoplastic chemotherapy: Secondary | ICD-10-CM | POA: Insufficient documentation

## 2020-04-19 DIAGNOSIS — Z79899 Other long term (current) drug therapy: Secondary | ICD-10-CM | POA: Diagnosis not present

## 2020-04-19 DIAGNOSIS — Z87442 Personal history of urinary calculi: Secondary | ICD-10-CM | POA: Insufficient documentation

## 2020-04-19 DIAGNOSIS — G629 Polyneuropathy, unspecified: Secondary | ICD-10-CM | POA: Insufficient documentation

## 2020-04-19 DIAGNOSIS — I10 Essential (primary) hypertension: Secondary | ICD-10-CM | POA: Insufficient documentation

## 2020-04-19 DIAGNOSIS — C50212 Malignant neoplasm of upper-inner quadrant of left female breast: Secondary | ICD-10-CM | POA: Diagnosis present

## 2020-04-19 LAB — CMP (CANCER CENTER ONLY)
ALT: 11 U/L (ref 0–44)
AST: 12 U/L — ABNORMAL LOW (ref 15–41)
Albumin: 3.5 g/dL (ref 3.5–5.0)
Alkaline Phosphatase: 106 U/L (ref 38–126)
Anion gap: 6 (ref 5–15)
BUN: 15 mg/dL (ref 8–23)
CO2: 26 mmol/L (ref 22–32)
Calcium: 10.5 mg/dL — ABNORMAL HIGH (ref 8.9–10.3)
Chloride: 106 mmol/L (ref 98–111)
Creatinine: 0.92 mg/dL (ref 0.44–1.00)
GFR, Estimated: >60 mL/min (ref >60)
Glucose, Bld: 162 mg/dL — ABNORMAL HIGH (ref 70–99)
Potassium: 3.3 mmol/L — ABNORMAL LOW (ref 3.5–5.1)
Sodium: 138 mmol/L (ref 135–145)
Total Bilirubin: 0.3 mg/dL (ref 0.3–1.2)
Total Protein: 6.6 g/dL (ref 6.5–8.1)

## 2020-04-19 LAB — CBC WITH DIFFERENTIAL (CANCER CENTER ONLY)
Abs Immature Granulocytes: 0.53 10*3/uL — ABNORMAL HIGH (ref 0.00–0.07)
Basophils Absolute: 0 10*3/uL (ref 0.0–0.1)
Basophils Relative: 0 %
Eosinophils Absolute: 0 10*3/uL (ref 0.0–0.5)
Eosinophils Relative: 0 %
HCT: 36.4 % (ref 36.0–46.0)
Hemoglobin: 12.2 g/dL (ref 12.0–15.0)
Immature Granulocytes: 5 %
Lymphocytes Relative: 10 %
Lymphs Abs: 1.1 10*3/uL (ref 0.7–4.0)
MCH: 29.9 pg (ref 26.0–34.0)
MCHC: 33.5 g/dL (ref 30.0–36.0)
MCV: 89.2 fL (ref 80.0–100.0)
Monocytes Absolute: 0.8 10*3/uL (ref 0.1–1.0)
Monocytes Relative: 7 %
Neutro Abs: 9.1 10*3/uL — ABNORMAL HIGH (ref 1.7–7.7)
Neutrophils Relative %: 78 %
Platelet Count: 221 10*3/uL (ref 150–400)
RBC: 4.08 MIL/uL (ref 3.87–5.11)
RDW: 18.8 % — ABNORMAL HIGH (ref 11.5–15.5)
WBC Count: 11.6 10*3/uL — ABNORMAL HIGH (ref 4.0–10.5)
nRBC: 0.5 % — ABNORMAL HIGH (ref 0.0–0.2)

## 2020-04-19 MED ORDER — SODIUM CHLORIDE 0.9 % IV SOLN
Freq: Once | INTRAVENOUS | Status: AC
  Filled 2020-04-19: qty 250

## 2020-04-19 MED ORDER — HEPARIN SOD (PORK) LOCK FLUSH 100 UNIT/ML IV SOLN
500.0000 [IU] | Freq: Once | INTRAVENOUS | Status: AC | PRN
  Administered 2020-04-19: 500 [IU]
  Filled 2020-04-19: qty 5

## 2020-04-19 MED ORDER — FAMOTIDINE IN NACL 20-0.9 MG/50ML-% IV SOLN
20.0000 mg | Freq: Once | INTRAVENOUS | Status: AC
  Administered 2020-04-19: 20 mg via INTRAVENOUS

## 2020-04-19 MED ORDER — DIPHENHYDRAMINE HCL 50 MG/ML IJ SOLN
25.0000 mg | Freq: Once | INTRAMUSCULAR | Status: AC
  Administered 2020-04-19: 25 mg via INTRAVENOUS

## 2020-04-19 MED ORDER — SODIUM CHLORIDE 0.9 % IV SOLN
10.0000 mg | Freq: Once | INTRAVENOUS | Status: AC
  Administered 2020-04-19: 10 mg via INTRAVENOUS
  Filled 2020-04-19: qty 10

## 2020-04-19 MED ORDER — PALONOSETRON HCL INJECTION 0.25 MG/5ML
INTRAVENOUS | Status: AC
  Filled 2020-04-19: qty 5

## 2020-04-19 MED ORDER — SODIUM CHLORIDE 0.9 % IV SOLN
164.4000 mg | Freq: Once | INTRAVENOUS | Status: AC
  Administered 2020-04-19: 160 mg via INTRAVENOUS
  Filled 2020-04-19: qty 16

## 2020-04-19 MED ORDER — PALONOSETRON HCL INJECTION 0.25 MG/5ML
0.2500 mg | Freq: Once | INTRAVENOUS | Status: AC
  Administered 2020-04-19: 0.25 mg via INTRAVENOUS

## 2020-04-19 MED ORDER — DIPHENHYDRAMINE HCL 50 MG/ML IJ SOLN
INTRAMUSCULAR | Status: AC
  Filled 2020-04-19: qty 1

## 2020-04-19 MED ORDER — FAMOTIDINE IN NACL 20-0.9 MG/50ML-% IV SOLN
INTRAVENOUS | Status: AC
  Filled 2020-04-19: qty 50

## 2020-04-19 MED ORDER — SODIUM CHLORIDE 0.9% FLUSH
10.0000 mL | INTRAVENOUS | Status: DC | PRN
  Administered 2020-04-19: 10 mL
  Filled 2020-04-19: qty 10

## 2020-04-19 MED ORDER — SODIUM CHLORIDE 0.9% FLUSH
10.0000 mL | INTRAVENOUS | Status: DC | PRN
  Administered 2020-04-19: 3 mL
  Filled 2020-04-19: qty 10

## 2020-04-19 MED ORDER — SODIUM CHLORIDE 0.9 % IV SOLN
80.0000 mg/m2 | Freq: Once | INTRAVENOUS | Status: AC
  Administered 2020-04-19: 144 mg via INTRAVENOUS
  Filled 2020-04-19: qty 24

## 2020-04-19 NOTE — Patient Instructions (Signed)
[facility] Discharge Instructions for Patients Receiving Chemotherapy  Today you received the following chemotherapy agents Taxol and Carboplatin.  To help prevent nausea and vomiting after your treatment, we encourage you to take your nausea medication as prescribed. Do not take Zofran (ondansatron) at home for three days following treatment.    If you develop nausea and vomiting that is not controlled by your nausea medication, call the clinic.   BELOW ARE SYMPTOMS THAT SHOULD BE REPORTED IMMEDIATELY:  *FEVER GREATER THAN 100.5 F  *CHILLS WITH OR WITHOUT FEVER  NAUSEA AND VOMITING THAT IS NOT CONTROLLED WITH YOUR NAUSEA MEDICATION  *UNUSUAL SHORTNESS OF BREATH  *UNUSUAL BRUISING OR BLEEDING  TENDERNESS IN MOUTH AND THROAT WITH OR WITHOUT PRESENCE OF ULCERS  *URINARY PROBLEMS  *BOWEL PROBLEMS  UNUSUAL RASH Items with * indicate a potential emergency and should be followed up as soon as possible.  Feel free to call the clinic should you have any questions or concerns. The clinic phone number is [phone].  Please show the [facility] at check-in to the Emergency Department and triage nurse.   Carboplatin injection What is this medicine? CARBOPLATIN (KAR boe pla tin) is a chemotherapy drug. It targets fast dividing cells, like cancer cells, and causes these cells to die. This medicine is used to treat ovarian cancer and many other cancers. This medicine may be used for other purposes; ask your health care provider or pharmacist if you have questions. COMMON BRAND NAME(S): Paraplatin What should I tell my health care provider before I take this medicine? They need to know if you have any of these conditions:  blood disorders  hearing problems  kidney disease  recent or ongoing radiation therapy  an unusual or allergic reaction to carboplatin, cisplatin, other chemotherapy, other medicines, foods, dyes, or preservatives  pregnant or trying to  get pregnant  breast-feeding How should I use this medicine? This drug is usually given as an infusion into a vein. It is administered in a hospital or clinic by a specially trained health care professional. Talk to your pediatrician regarding the use of this medicine in children. Special care may be needed. Overdosage: If you think you have taken too much of this medicine contact a poison control center or emergency room at once. NOTE: This medicine is only for you. Do not share this medicine with others. What if I miss a dose? It is important not to miss a dose. Call your doctor or health care professional if you are unable to keep an appointment. What may interact with this medicine?  medicines for seizures  medicines to increase blood counts like filgrastim, pegfilgrastim, sargramostim  some antibiotics like amikacin, gentamicin, neomycin, streptomycin, tobramycin  vaccines Talk to your doctor or health care professional before taking any of these medicines:  acetaminophen  aspirin  ibuprofen  ketoprofen  naproxen This list may not describe all possible interactions. Give your health care provider a list of all the medicines, herbs, non-prescription drugs, or dietary supplements you use. Also tell them if you smoke, drink alcohol, or use illegal drugs. Some items may interact with your medicine. What should I watch for while using this medicine? Your condition will be monitored carefully while you are receiving this medicine. You will need important blood work done while you are taking this medicine. This drug may make you feel generally unwell. This is not uncommon, as chemotherapy can affect healthy cells as well as cancer cells. Report any side effects. Continue  your course of treatment even though you feel ill unless your doctor tells you to stop. In some cases, you may be given additional medicines to help with side effects. Follow all directions for their use. Call your  doctor or health care professional for advice if you get a fever, chills or sore throat, or other symptoms of a cold or flu. Do not treat yourself. This drug decreases your body's ability to fight infections. Try to avoid being around people who are sick. This medicine may increase your risk to bruise or bleed. Call your doctor or health care professional if you notice any unusual bleeding. Be careful brushing and flossing your teeth or using a toothpick because you may get an infection or bleed more easily. If you have any dental work done, tell your dentist you are receiving this medicine. Avoid taking products that contain aspirin, acetaminophen, ibuprofen, naproxen, or ketoprofen unless instructed by your doctor. These medicines may hide a fever. Do not become pregnant while taking this medicine. Women should inform their doctor if they wish to become pregnant or think they might be pregnant. There is a potential for serious side effects to an unborn child. Talk to your health care professional or pharmacist for more information. Do not breast-feed an infant while taking this medicine. What side effects may I notice from receiving this medicine? Side effects that you should report to your doctor or health care professional as soon as possible:  allergic reactions like skin rash, itching or hives, swelling of the face, lips, or tongue  signs of infection - fever or chills, cough, sore throat, pain or difficulty passing urine  signs of decreased platelets or bleeding - bruising, pinpoint red spots on the skin, black, tarry stools, nosebleeds  signs of decreased red blood cells - unusually weak or tired, fainting spells, lightheadedness  breathing problems  changes in hearing  changes in vision  chest pain  high blood pressure  low blood counts - This drug may decrease the number of white blood cells, red blood cells and platelets. You may be at increased risk for infections and  bleeding.  nausea and vomiting  pain, swelling, redness or irritation at the injection site  pain, tingling, numbness in the hands or feet  problems with balance, talking, walking  trouble passing urine or change in the amount of urine Side effects that usually do not require medical attention (report to your doctor or health care professional if they continue or are bothersome):  hair loss  loss of appetite  metallic taste in the mouth or changes in taste This list may not describe all possible side effects. Call your doctor for medical advice about side effects. You may report side effects to FDA at 1-800-FDA-1088. Where should I keep my medicine? This drug is given in a hospital or clinic and will not be stored at home. NOTE: This sheet is a summary. It may not cover all possible information. If you have questions about this medicine, talk to your doctor, pharmacist, or health care provider.  2020 Elsevier/Gold Standard (2007-09-02 14:38:05)  Paclitaxel injection What is this medicine? PACLITAXEL (PAK li TAX el) is a chemotherapy drug. It targets fast dividing cells, like cancer cells, and causes these cells to die. This medicine is used to treat ovarian cancer, breast cancer, lung cancer, Kaposi's sarcoma, and other cancers. This medicine may be used for other purposes; ask your health care provider or pharmacist if you have questions. COMMON BRAND NAME(S): Onxol, Taxol  What should I tell my health care provider before I take this medicine? They need to know if you have any of these conditions:  history of irregular heartbeat  liver disease  low blood counts, like low white cell, platelet, or red cell counts  lung or breathing disease, like asthma  tingling of the fingers or toes, or other nerve disorder  an unusual or allergic reaction to paclitaxel, alcohol, polyoxyethylated castor oil, other chemotherapy, other medicines, foods, dyes, or preservatives  pregnant or  trying to get pregnant  breast-feeding How should I use this medicine? This drug is given as an infusion into a vein. It is administered in a hospital or clinic by a specially trained health care professional. Talk to your pediatrician regarding the use of this medicine in children. Special care may be needed. Overdosage: If you think you have taken too much of this medicine contact a poison control center or emergency room at once. NOTE: This medicine is only for you. Do not share this medicine with others. What if I miss a dose? It is important not to miss your dose. Call your doctor or health care professional if you are unable to keep an appointment. What may interact with this medicine? Do not take this medicine with any of the following medications:  disulfiram  metronidazole This medicine may also interact with the following medications:  antiviral medicines for hepatitis, HIV or AIDS  certain antibiotics like erythromycin and clarithromycin  certain medicines for fungal infections like ketoconazole and itraconazole  certain medicines for seizures like carbamazepine, phenobarbital, phenytoin  gemfibrozil  nefazodone  rifampin  St. John's wort This list may not describe all possible interactions. Give your health care provider a list of all the medicines, herbs, non-prescription drugs, or dietary supplements you use. Also tell them if you smoke, drink alcohol, or use illegal drugs. Some items may interact with your medicine. What should I watch for while using this medicine? Your condition will be monitored carefully while you are receiving this medicine. You will need important blood work done while you are taking this medicine. This medicine can cause serious allergic reactions. To reduce your risk you will need to take other medicine(s) before treatment with this medicine. If you experience allergic reactions like skin rash, itching or hives, swelling of the face, lips,  or tongue, tell your doctor or health care professional right away. In some cases, you may be given additional medicines to help with side effects. Follow all directions for their use. This drug may make you feel generally unwell. This is not uncommon, as chemotherapy can affect healthy cells as well as cancer cells. Report any side effects. Continue your course of treatment even though you feel ill unless your doctor tells you to stop. Call your doctor or health care professional for advice if you get a fever, chills or sore throat, or other symptoms of a cold or flu. Do not treat yourself. This drug decreases your body's ability to fight infections. Try to avoid being around people who are sick. This medicine may increase your risk to bruise or bleed. Call your doctor or health care professional if you notice any unusual bleeding. Be careful brushing and flossing your teeth or using a toothpick because you may get an infection or bleed more easily. If you have any dental work done, tell your dentist you are receiving this medicine. Avoid taking products that contain aspirin, acetaminophen, ibuprofen, naproxen, or ketoprofen unless instructed by your doctor. These medicines  may hide a fever. Do not become pregnant while taking this medicine. Women should inform their doctor if they wish to become pregnant or think they might be pregnant. There is a potential for serious side effects to an unborn child. Talk to your health care professional or pharmacist for more information. Do not breast-feed an infant while taking this medicine. Men are advised not to father a child while receiving this medicine. This product may contain alcohol. Ask your pharmacist or healthcare provider if this medicine contains alcohol. Be sure to tell all healthcare providers you are taking this medicine. Certain medicines, like metronidazole and disulfiram, can cause an unpleasant reaction when taken with alcohol. The reaction  includes flushing, headache, nausea, vomiting, sweating, and increased thirst. The reaction can last from 30 minutes to several hours. What side effects may I notice from receiving this medicine? Side effects that you should report to your doctor or health care professional as soon as possible:  allergic reactions like skin rash, itching or hives, swelling of the face, lips, or tongue  breathing problems  changes in vision  fast, irregular heartbeat  high or low blood pressure  mouth sores  pain, tingling, numbness in the hands or feet  signs of decreased platelets or bleeding - bruising, pinpoint red spots on the skin, black, tarry stools, blood in the urine  signs of decreased red blood cells - unusually weak or tired, feeling faint or lightheaded, falls  signs of infection - fever or chills, cough, sore throat, pain or difficulty passing urine  signs and symptoms of liver injury like dark yellow or brown urine; general ill feeling or flu-like symptoms; light-colored stools; loss of appetite; nausea; right upper belly pain; unusually weak or tired; yellowing of the eyes or skin  swelling of the ankles, feet, hands  unusually slow heartbeat Side effects that usually do not require medical attention (report to your doctor or health care professional if they continue or are bothersome):  diarrhea  hair loss  loss of appetite  muscle or joint pain  nausea, vomiting  pain, redness, or irritation at site where injected  tiredness This list may not describe all possible side effects. Call your doctor for medical advice about side effects. You may report side effects to FDA at 1-800-FDA-1088. Where should I keep my medicine? This drug is given in a hospital or clinic and will not be stored at home. NOTE: This sheet is a summary. It may not cover all possible information. If you have questions about this medicine, talk to your doctor, pharmacist, or health care provider.   2020 Elsevier/Gold Standard (2017-01-29 13:14:55)

## 2020-04-19 NOTE — Progress Notes (Signed)
MD changed Carbo frequency to weekly, AUC 2. Emend deleted from premedications due to decreased Carboplatin weekly AUC.  Hardie Pulley, PharmD, BCPS, BCOP

## 2020-04-19 NOTE — Patient Instructions (Signed)

## 2020-04-21 ENCOUNTER — Telehealth: Payer: Self-pay | Admitting: Oncology

## 2020-04-21 NOTE — Telephone Encounter (Signed)
No 11/9 los, no changes made to pt schedule  

## 2020-04-25 NOTE — Progress Notes (Signed)
[place]  [phone] [phone]     ID: Neela Zecca DOB: 1946-01-14  MR#: [id number]  RXV#:[id number]  Patient Care Team: Montez Morita, PA-C as PCP - General (Hematology and Oncology) Elmo Rio, [name], MD as Consulting Physician (Oncology) [name], MD as Consulting Physician (General Surgery) [name], MD as Consulting Physician (Orthopedic Surgery) [name], RN as Oncology Nurse Navigator [name], RN as Oncology Nurse Navigator Chauncey Cruel, MD OTHER MD: [name] MD ([place]); [name] NP   CHIEF COMPLAINT: Triple negative breast cancer  CURRENT TREATMENT: neoadjuvant chemotherapy   INTERVAL HISTORY: Brenly returns today for follow up and treatment of her triple negative breast cancer accompanied by her son.   She began carboplatin and paclitaxel last week. Today is week 2 out of 12 planned. She tolerated the treatment remarkably well.  She felt a little bit tired and sleepy on day 2.  She is also developed a little bit of taste perversion.  She says her appetite is good however.  She has had no nausea and no peripheral neuropathy symptoms  REVIEW OF SYSTEMS: Jamarria continues to do all her activities of daily living normally and maintains an excellent functional status for her age   COVID 79 VACCINATION STATUS: s/p Moderna x2, with booster pending   HISTORY OF CURRENT ILLNESS: From the original intake note:  Gricel herself palpated a mass in her left breast during showering.  She brought this to medical attention and on 12/16/2019 underwent mammography, the results of which I do not have today.  She proceeded to left breast ultrasound showing breast density B.  There was a mass in the upper inner quadrant of the left breast at the 10 o'clock position 7 cm from the nipple.  It measured 3.26 cm.  There are no ultrasound comments regarding the axilla.  Biopsy of the mass in question was  performed 01/15/2020 at [place] [facility] and the pathology report describes a biphasic tumor with malignant epithelial component and a mesenchymal component.  The epithelial component appears to squamoid and stain strongly for CK 8/18 by immunohistochemistry.  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  The tumor was essentially triple negative, estrogen receptor 0%, progesterone receptor 1% with 1+ intensity, HER-2 1+ by immunohistochemistry, Ki-67 12%.  (The 1% progesterone receptor positivity is best read as an artifact as the progesterone receptor does not function in the absence of a functional estrogen receptor).  The patient's subsequent history is as detailed below   PAST MEDICAL HISTORY: Past Medical History:  Diagnosis Date  . Arthritis    oa  . History of kidney stones   . Hypertension     PAST SURGICAL HISTORY: Past Surgical History:  Procedure Laterality Date  . CYSTOSCOPY  yrs ago  . IR IMAGING GUIDED PORT INSERTION  02/16/2020  . TOTAL KNEE ARTHROPLASTY Left 06/05/2016   Procedure: LEFT TOTAL KNEE ARTHROPLASTY;  Surgeon: [name], MD;  Location: [facility];  Service: Orthopedics;  Laterality: Left;    FAMILY HISTORY No family history on file.  The patient has no information on her father.  Her mother died at the age of 5.  She had 1 sister and 2 brothers.  No one on that side of the family is known to have had cancer.  The patient herself had 5/2 siblings, 2 sisters and 3 brothers, none with a history of  cancer.   GYNECOLOGIC HISTORY:  No LMP recorded. Patient is postmenopausal. Menarche: 74 years old Age at first live birth: 74 years old GX P 2 LMP mid 70s Contraceptive no HRT no  Hysterectomy?  No Salpingo-oophorectomy?  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  She is divorced and her former husband subsequently  died.  She lives by herself with no pets.  Her son [name] 55 is a Publishing copy and travels a great deal as a Research scientist (medical).  Daughter [name] teaches middle school in [place].  The patient has 4 grandchildren, no great-grandchildren.  She is a Control and instrumentation engineer.    ADVANCED DIRECTIVES: Not in place   HEALTH MAINTENANCE: Social History   Tobacco Use  . Smoking status: Never Smoker  . Smokeless tobacco: Never Used  Substance Use Topics  . Alcohol use: No  . Drug use: No     Colonoscopy: Never  PAP: Remote  Bone density: Remote   Allergies  Allergen Reactions  . Benazepril Swelling    Patient had severe angioedema on 10/05/2015.    Current Outpatient Medications  Medication Sig Dispense Refill  . amLODipine (NORVASC) 10 MG tablet Take 10 mg by mouth daily after lunch.     Marland Kitchen aspirin 81 MG chewable tablet Chew 1 tablet (81 mg total) by mouth 2 (two) times daily. Take for 4 weeks. 60 tablet 0  . chlorthalidone (HYGROTON) 25 MG tablet Take 25 mg by mouth daily after lunch.     . Cholecalciferol (VITAMIN D) 2000 units CAPS Take 2,000 Units by mouth daily after lunch.    . dexamethasone (DECADRON) 4 MG tablet Take 2 tablets by mouth daily starting the day after Carboplatin and Cytoxan x 3 days. Take with food. 30 tablet 1  . lidocaine-prilocaine (EMLA) cream Apply to affected area once 30 g 3  . loratadine (CLARITIN) 10 MG tablet Take 1 tablet (10 mg total) by mouth daily. Starting the day after chemotherapy for 10 days, then as needed. 30 tablet 3  . LORazepam (ATIVAN) 0.5 MG tablet     . losartan (COZAAR) 50 MG tablet Take 50 mg by mouth daily after lunch.    . Multiple Vitamins-Minerals (CENTRUM ADULTS) TABS 1 tab(s)    . potassium chloride (MICRO-K) 10 MEQ CR capsule Take 10 mEq by mouth 2 (two) times daily.    . prochlorperazine (COMPAZINE) 10 MG tablet Take 1 tablet (10 mg total) by mouth every 6 (six) hours as needed (Nausea or vomiting). 30 tablet 1   No current  facility-administered medications for this visit.   Facility-Administered Medications Ordered in Other Visits  Medication Dose Route Frequency Provider Last Rate Last Admin  . sodium chloride flush (NS) 0.9 % injection 10 mL  10 mL Intracatheter PRN Tianni Escamilla, Valentino Hue, MD   10 mL at 04/26/20 0814    OBJECTIVE: African-American woman who appears stated age Vitals:   04/26/20 0837  BP: (!) 124/91  Pulse: (!) 101  Resp: 17  Temp: 98.1 F (36.7 C)  SpO2: 99%     Body mass index is 26.8 kg/m.   Wt Readings from Last 3 Encounters:  04/26/20 151 lb 4.8 oz (68.6 kg)  04/19/20 156 lb (70.8 kg)  04/05/20 155 lb 9.6 oz (70.6 kg)      ECOG FS:1 - Symptomatic but completely ambulatory  Sclerae unicteric, EOMs intact Wearing a mask No cervical or supraclavicular adenopathy Lungs no rales or rhonchi  Heart regular rate and rhythm Abd soft, nontender, positive bowel sounds MSK no focal spinal tenderness, no upper extremity lymphedema Neuro: nonfocal, well oriented, appropriate affect Breasts: Deferred     LAB RESULTS:  CMP     Component Value Date/Time   NA 136 04/26/2020 0825   K 3.0 (L) 04/26/2020 0825   CL 101 04/26/2020 0825   CO2 27 04/26/2020 0825   GLUCOSE 150 (H) 04/26/2020 0825   BUN 17 04/26/2020 0825   CREATININE 0.89 04/26/2020 0825   CALCIUM 10.6 (H) 04/26/2020 0825   PROT 6.5 04/26/2020 0825   ALBUMIN 3.6 04/26/2020 0825   AST 11 (L) 04/26/2020 0825   ALT 13 04/26/2020 0825   ALKPHOS 76 04/26/2020 0825   BILITOT 0.6 04/26/2020 0825   GFRNONAA >60 04/26/2020 0825   GFRAA >60 03/08/2020 0906    No results found for: TOTALPROTELP, ALBUMINELP, A1GS, A2GS, BETS, BETA2SER, GAMS, MSPIKE, SPEI  No results found for: KPAFRELGTCHN, LAMBDASER, KAPLAMBRATIO  Lab Results  Component Value Date   WBC 3.8 (L) 04/26/2020   NEUTROABS 2.6 04/26/2020   HGB 12.5 04/26/2020   HCT 36.5 04/26/2020   MCV 89.0 04/26/2020   PLT 435 (H) 04/26/2020      Chemistry        Component Value Date/Time   NA 136 04/26/2020 0825   K 3.0 (L) 04/26/2020 0825   CL 101 04/26/2020 0825   CO2 27 04/26/2020 0825   BUN 17 04/26/2020 0825   CREATININE 0.89 04/26/2020 0825      Component Value Date/Time   CALCIUM 10.6 (H) 04/26/2020 0825   ALKPHOS 76 04/26/2020 0825   AST 11 (L) 04/26/2020 0825   ALT 13 04/26/2020 0825   BILITOT 0.6 04/26/2020 0825       No results found for: LABCA2  No components found for: [id number]  No results for input(s): INR in the last 168 hours.  No results found for: LABCA2  No results found for: DDU202  No results found for: RKY706  No results found for: CBJ628  No results found for: CA2729  No components found for: HGQUANT  No results found for: CEA1 / No results found for: CEA1   No results found for: AFPTUMOR  No results found for: CHROMOGRNA  No results found for: HGBA, HGBA2QUANT, HGBFQUANT, HGBSQUAN (Hemoglobinopathy evaluation)   No results found for: LDH  No results found for: IRON, TIBC, IRONPCTSAT (Iron and TIBC)  No results found for: FERRITIN  Urinalysis No results found for: COLORURINE, APPEARANCEUR, LABSPEC, PHURINE, GLUCOSEU, HGBUR, BILIRUBINUR, KETONESUR, PROTEINUR, UROBILINOGEN, NITRITE, LEUKOCYTESUR   STUDIES: No results found.   ELIGIBLE FOR AVAILABLE RESEARCH PROTOCOL: no  ASSESSMENT: 74 y.o. [place] woman status post left breast upper inner quadrant biopsy 01/15/2020 for a clinical T2 NX, stage IIB anaplastic carcinoma, triple negative, with an MIB-1 of 12%  (a) bone scan and CT chest 02/12/2020 show no evidence of metastatic disease  (1) neoadjuvant chemotherapy with doxorubicin and cyclophosphamide in dose dense fashion x4 started 02/23/2020, followed by weekly paclitaxel and carboplatin x12 starting 04/19/2020  (a) echo 02/12/2020 shows an ejection fraction in the 60-65% range  (2) definitive surgery to follow  (3) adjuvant radiation as  appropriate   PLAN: Tanekia is tolerating the [place] and Taxol treatments remarkably well.  Again cautioned her regarding the development of neuropathy and she will alert [place] if she develops any numbness or tingling over her finger pads or toe pads.  I also let her know that she  is welcome to take a week off if she wishes for Thanksgiving's for Christmas or for any other reason.  We will simply make it up at the end.  That does not affect the treatment effectiveness.  At this point we will start seeing her every other week continuing weekly treatments of course.  Total encounter time 20 minutes.   Chauncey Cruel, MD   04/26/2020 9:05 AM Medical Oncology and Hematology [facility] [place], [place] [address] Tel. [phone]    Fax. [phone]   I, Wilburn Mylar, am acting as scribe for Dr. [name]. Zackerie Sara.  I, Lurline Del MD, have reviewed the above documentation for accuracy and completeness, and I agree with the above.   *Total Encounter Time as defined by the Centers for Medicare and Medicaid Services includes, in addition to the face-to-face time of a patient visit (documented in the note above) non-face-to-face time: obtaining and reviewing outside history, ordering and reviewing medications, tests or procedures, care coordination (communications with other health care professionals or caregivers) and documentation in the medical record.

## 2020-04-26 ENCOUNTER — Inpatient Hospital Stay: Payer: Medicare PPO

## 2020-04-26 ENCOUNTER — Other Ambulatory Visit: Payer: Self-pay

## 2020-04-26 ENCOUNTER — Inpatient Hospital Stay (HOSPITAL_BASED_OUTPATIENT_CLINIC_OR_DEPARTMENT_OTHER): Payer: Medicare PPO | Admitting: Oncology

## 2020-04-26 ENCOUNTER — Encounter: Payer: Self-pay | Admitting: *Deleted

## 2020-04-26 ENCOUNTER — Other Ambulatory Visit: Payer: Self-pay | Admitting: Oncology

## 2020-04-26 VITALS — HR 88

## 2020-04-26 VITALS — BP 124/91 | HR 101 | Temp 98.1°F | Resp 17 | Ht 63.0 in | Wt 151.3 lb

## 2020-04-26 DIAGNOSIS — Z171 Estrogen receptor negative status [ER-]: Secondary | ICD-10-CM

## 2020-04-26 DIAGNOSIS — C50919 Malignant neoplasm of unspecified site of unspecified female breast: Secondary | ICD-10-CM

## 2020-04-26 DIAGNOSIS — Z5111 Encounter for antineoplastic chemotherapy: Secondary | ICD-10-CM | POA: Diagnosis not present

## 2020-04-26 DIAGNOSIS — C50212 Malignant neoplasm of upper-inner quadrant of left female breast: Secondary | ICD-10-CM

## 2020-04-26 DIAGNOSIS — Z95828 Presence of other vascular implants and grafts: Secondary | ICD-10-CM

## 2020-04-26 LAB — CBC WITH DIFFERENTIAL (CANCER CENTER ONLY)
Abs Immature Granulocytes: 0.03 10*3/uL (ref 0.00–0.07)
Basophils Absolute: 0 10*3/uL (ref 0.0–0.1)
Basophils Relative: 1 %
Eosinophils Absolute: 0 10*3/uL (ref 0.0–0.5)
Eosinophils Relative: 0 %
HCT: 36.5 % (ref 36.0–46.0)
Hemoglobin: 12.5 g/dL (ref 12.0–15.0)
Immature Granulocytes: 1 %
Lymphocytes Relative: 17 %
Lymphs Abs: 0.6 10*3/uL — ABNORMAL LOW (ref 0.7–4.0)
MCH: 30.5 pg (ref 26.0–34.0)
MCHC: 34.2 g/dL (ref 30.0–36.0)
MCV: 89 fL (ref 80.0–100.0)
Monocytes Absolute: 0.5 10*3/uL (ref 0.1–1.0)
Monocytes Relative: 12 %
Neutro Abs: 2.6 10*3/uL (ref 1.7–7.7)
Neutrophils Relative %: 69 %
Platelet Count: 435 10*3/uL — ABNORMAL HIGH (ref 150–400)
RBC: 4.1 MIL/uL (ref 3.87–5.11)
RDW: 18.1 % — ABNORMAL HIGH (ref 11.5–15.5)
WBC Count: 3.8 10*3/uL — ABNORMAL LOW (ref 4.0–10.5)
nRBC: 0 % (ref 0.0–0.2)

## 2020-04-26 LAB — CMP (CANCER CENTER ONLY)
ALT: 13 U/L (ref 0–44)
AST: 11 U/L — ABNORMAL LOW (ref 15–41)
Albumin: 3.6 g/dL (ref 3.5–5.0)
Alkaline Phosphatase: 76 U/L (ref 38–126)
Anion gap: 8 (ref 5–15)
BUN: 17 mg/dL (ref 8–23)
CO2: 27 mmol/L (ref 22–32)
Calcium: 10.6 mg/dL — ABNORMAL HIGH (ref 8.9–10.3)
Chloride: 101 mmol/L (ref 98–111)
Creatinine: 0.89 mg/dL (ref 0.44–1.00)
GFR, Estimated: >60 mL/min (ref >60)
Glucose, Bld: 150 mg/dL — ABNORMAL HIGH (ref 70–99)
Potassium: 3 mmol/L — ABNORMAL LOW (ref 3.5–5.1)
Sodium: 136 mmol/L (ref 135–145)
Total Bilirubin: 0.6 mg/dL (ref 0.3–1.2)
Total Protein: 6.5 g/dL (ref 6.5–8.1)

## 2020-04-26 MED ORDER — DIPHENHYDRAMINE HCL 50 MG/ML IJ SOLN
25.0000 mg | Freq: Once | INTRAMUSCULAR | Status: AC
  Administered 2020-04-26: 25 mg via INTRAVENOUS

## 2020-04-26 MED ORDER — PALONOSETRON HCL INJECTION 0.25 MG/5ML
INTRAVENOUS | Status: AC
  Filled 2020-04-26: qty 5

## 2020-04-26 MED ORDER — FAMOTIDINE IN NACL 20-0.9 MG/50ML-% IV SOLN
20.0000 mg | Freq: Once | INTRAVENOUS | Status: AC
  Administered 2020-04-26: 20 mg via INTRAVENOUS

## 2020-04-26 MED ORDER — FAMOTIDINE IN NACL 20-0.9 MG/50ML-% IV SOLN
INTRAVENOUS | Status: AC
  Filled 2020-04-26: qty 50

## 2020-04-26 MED ORDER — SODIUM CHLORIDE 0.9 % IV SOLN
80.0000 mg/m2 | Freq: Once | INTRAVENOUS | Status: AC
  Administered 2020-04-26: 144 mg via INTRAVENOUS
  Filled 2020-04-26: qty 24

## 2020-04-26 MED ORDER — SODIUM CHLORIDE 0.9 % IV SOLN
10.0000 mg | Freq: Once | INTRAVENOUS | Status: AC
  Administered 2020-04-26: 10 mg via INTRAVENOUS
  Filled 2020-04-26: qty 10

## 2020-04-26 MED ORDER — SODIUM CHLORIDE 0.9% FLUSH
10.0000 mL | INTRAVENOUS | Status: DC | PRN
  Administered 2020-04-26: 10 mL
  Filled 2020-04-26: qty 10

## 2020-04-26 MED ORDER — PALONOSETRON HCL INJECTION 0.25 MG/5ML
0.2500 mg | Freq: Once | INTRAVENOUS | Status: AC
  Administered 2020-04-26: 0.25 mg via INTRAVENOUS

## 2020-04-26 MED ORDER — HEPARIN SOD (PORK) LOCK FLUSH 100 UNIT/ML IV SOLN
500.0000 [IU] | Freq: Once | INTRAVENOUS | Status: AC | PRN
  Administered 2020-04-26: 500 [IU]
  Filled 2020-04-26: qty 5

## 2020-04-26 MED ORDER — SODIUM CHLORIDE 0.9 % IV SOLN
164.4000 mg | Freq: Once | INTRAVENOUS | Status: AC
  Administered 2020-04-26: 160 mg via INTRAVENOUS
  Filled 2020-04-26: qty 16

## 2020-04-26 MED ORDER — SODIUM CHLORIDE 0.9 % IV SOLN
Freq: Once | INTRAVENOUS | Status: AC
  Filled 2020-04-26: qty 250

## 2020-04-26 MED ORDER — DIPHENHYDRAMINE HCL 50 MG/ML IJ SOLN
INTRAMUSCULAR | Status: AC
  Filled 2020-04-26: qty 1

## 2020-04-26 NOTE — Patient Instructions (Signed)

## 2020-04-26 NOTE — Patient Instructions (Signed)
[facility] Discharge Instructions for Patients Receiving Chemotherapy  Today you received the following chemotherapy agents Paclitaxel (TAXOL) & Carboplatin (PARAPLATIN).  To help prevent nausea and vomiting after your treatment, we encourage you to take your nausea medication as prescribed.  If you develop nausea and vomiting that is not controlled by your nausea medication, call the clinic.   BELOW ARE SYMPTOMS THAT SHOULD BE REPORTED IMMEDIATELY:  *FEVER GREATER THAN 100.5 F  *CHILLS WITH OR WITHOUT FEVER  NAUSEA AND VOMITING THAT IS NOT CONTROLLED WITH YOUR NAUSEA MEDICATION  *UNUSUAL SHORTNESS OF BREATH  *UNUSUAL BRUISING OR BLEEDING  TENDERNESS IN MOUTH AND THROAT WITH OR WITHOUT PRESENCE OF ULCERS  *URINARY PROBLEMS  *BOWEL PROBLEMS  UNUSUAL RASH Items with * indicate a potential emergency and should be followed up as soon as possible.  Feel free to call the clinic should you have any questions or concerns. The clinic phone number is [phone].  Please show the CHEMO ALERT CARD at check-in to the Emergency Department and triage nurse.   

## 2020-04-27 ENCOUNTER — Telehealth: Payer: Self-pay

## 2020-04-27 NOTE — Telephone Encounter (Signed)
This LPN called and spoke with pt's daughter per Dr Jana Hakim to encourage pt to take her potassium supplement as prescribed. Daughter states she will find out and make sure pt is taking this. Daughter understands to call us with any concerns.

## 2020-05-03 ENCOUNTER — Inpatient Hospital Stay: Payer: Medicare PPO

## 2020-05-03 ENCOUNTER — Other Ambulatory Visit: Payer: Self-pay

## 2020-05-03 VITALS — BP 141/85 | HR 73 | Temp 98.2°F | Resp 18 | Wt 155.8 lb

## 2020-05-03 DIAGNOSIS — C50919 Malignant neoplasm of unspecified site of unspecified female breast: Secondary | ICD-10-CM

## 2020-05-03 DIAGNOSIS — Z5111 Encounter for antineoplastic chemotherapy: Secondary | ICD-10-CM | POA: Diagnosis not present

## 2020-05-03 DIAGNOSIS — Z171 Estrogen receptor negative status [ER-]: Secondary | ICD-10-CM

## 2020-05-03 DIAGNOSIS — C50212 Malignant neoplasm of upper-inner quadrant of left female breast: Secondary | ICD-10-CM

## 2020-05-03 DIAGNOSIS — Z95828 Presence of other vascular implants and grafts: Secondary | ICD-10-CM

## 2020-05-03 LAB — CBC WITH DIFFERENTIAL (CANCER CENTER ONLY)
Abs Immature Granulocytes: 0.06 10*3/uL (ref 0.00–0.07)
Basophils Absolute: 0 10*3/uL (ref 0.0–0.1)
Basophils Relative: 1 %
Eosinophils Absolute: 0 10*3/uL (ref 0.0–0.5)
Eosinophils Relative: 0 %
HCT: 35.1 % — ABNORMAL LOW (ref 36.0–46.0)
Hemoglobin: 11.7 g/dL — ABNORMAL LOW (ref 12.0–15.0)
Immature Granulocytes: 1 %
Lymphocytes Relative: 17 %
Lymphs Abs: 0.9 10*3/uL (ref 0.7–4.0)
MCH: 30.5 pg (ref 26.0–34.0)
MCHC: 33.3 g/dL (ref 30.0–36.0)
MCV: 91.6 fL (ref 80.0–100.0)
Monocytes Absolute: 0.6 10*3/uL (ref 0.1–1.0)
Monocytes Relative: 11 %
Neutro Abs: 3.8 10*3/uL (ref 1.7–7.7)
Neutrophils Relative %: 70 %
Platelet Count: 424 10*3/uL — ABNORMAL HIGH (ref 150–400)
RBC: 3.83 MIL/uL — ABNORMAL LOW (ref 3.87–5.11)
RDW: 18.5 % — ABNORMAL HIGH (ref 11.5–15.5)
WBC Count: 5.4 10*3/uL (ref 4.0–10.5)
nRBC: 0 % (ref 0.0–0.2)

## 2020-05-03 LAB — CMP (CANCER CENTER ONLY)
ALT: 12 U/L (ref 0–44)
AST: 12 U/L — ABNORMAL LOW (ref 15–41)
Albumin: 3.4 g/dL — ABNORMAL LOW (ref 3.5–5.0)
Alkaline Phosphatase: 68 U/L (ref 38–126)
Anion gap: 9 (ref 5–15)
BUN: 16 mg/dL (ref 8–23)
CO2: 25 mmol/L (ref 22–32)
Calcium: 11.2 mg/dL — ABNORMAL HIGH (ref 8.9–10.3)
Chloride: 103 mmol/L (ref 98–111)
Creatinine: 0.81 mg/dL (ref 0.44–1.00)
GFR, Estimated: >60 mL/min (ref >60)
Glucose, Bld: 120 mg/dL — ABNORMAL HIGH (ref 70–99)
Potassium: 3.6 mmol/L (ref 3.5–5.1)
Sodium: 137 mmol/L (ref 135–145)
Total Bilirubin: 0.4 mg/dL (ref 0.3–1.2)
Total Protein: 6.4 g/dL — ABNORMAL LOW (ref 6.5–8.1)

## 2020-05-03 MED ORDER — SODIUM CHLORIDE 0.9% FLUSH
10.0000 mL | INTRAVENOUS | Status: DC | PRN
  Administered 2020-05-03: 10 mL
  Filled 2020-05-03: qty 10

## 2020-05-03 MED ORDER — SODIUM CHLORIDE 0.9 % IV SOLN
Freq: Once | INTRAVENOUS | Status: AC
  Filled 2020-05-03: qty 250

## 2020-05-03 MED ORDER — DIPHENHYDRAMINE HCL 50 MG/ML IJ SOLN
25.0000 mg | Freq: Once | INTRAMUSCULAR | Status: AC
  Administered 2020-05-03: 25 mg via INTRAVENOUS

## 2020-05-03 MED ORDER — SODIUM CHLORIDE 0.9 % IV SOLN
80.0000 mg/m2 | Freq: Once | INTRAVENOUS | Status: AC
  Administered 2020-05-03: 144 mg via INTRAVENOUS
  Filled 2020-05-03: qty 24

## 2020-05-03 MED ORDER — FAMOTIDINE IN NACL 20-0.9 MG/50ML-% IV SOLN
INTRAVENOUS | Status: AC
  Filled 2020-05-03: qty 50

## 2020-05-03 MED ORDER — FAMOTIDINE IN NACL 20-0.9 MG/50ML-% IV SOLN
20.0000 mg | Freq: Once | INTRAVENOUS | Status: AC
  Administered 2020-05-03: 20 mg via INTRAVENOUS

## 2020-05-03 MED ORDER — HEPARIN SOD (PORK) LOCK FLUSH 100 UNIT/ML IV SOLN
500.0000 [IU] | Freq: Once | INTRAVENOUS | Status: AC | PRN
  Administered 2020-05-03: 500 [IU]
  Filled 2020-05-03: qty 5

## 2020-05-03 MED ORDER — PALONOSETRON HCL INJECTION 0.25 MG/5ML
0.2500 mg | Freq: Once | INTRAVENOUS | Status: AC
  Administered 2020-05-03: 0.25 mg via INTRAVENOUS

## 2020-05-03 MED ORDER — SODIUM CHLORIDE 0.9 % IV SOLN
164.4000 mg | Freq: Once | INTRAVENOUS | Status: AC
  Administered 2020-05-03: 160 mg via INTRAVENOUS
  Filled 2020-05-03: qty 16

## 2020-05-03 MED ORDER — DIPHENHYDRAMINE HCL 50 MG/ML IJ SOLN
INTRAMUSCULAR | Status: AC
  Filled 2020-05-03: qty 1

## 2020-05-03 MED ORDER — PALONOSETRON HCL INJECTION 0.25 MG/5ML
INTRAVENOUS | Status: AC
  Filled 2020-05-03: qty 5

## 2020-05-03 MED ORDER — SODIUM CHLORIDE 0.9 % IV SOLN
10.0000 mg | Freq: Once | INTRAVENOUS | Status: AC
  Administered 2020-05-03: 10 mg via INTRAVENOUS
  Filled 2020-05-03: qty 10

## 2020-05-03 NOTE — Patient Instructions (Signed)
   [facility] Discharge Instructions for Patients Receiving Chemotherapy  Today you received the following chemotherapy agents Taxol and Carboplatin   To help prevent nausea and vomiting after your treatment, we encourage you to take your nausea medication as directed.    If you develop nausea and vomiting that is not controlled by your nausea medication, call the clinic.   BELOW ARE SYMPTOMS THAT SHOULD BE REPORTED IMMEDIATELY:  *FEVER GREATER THAN 100.5 F  *CHILLS WITH OR WITHOUT FEVER  NAUSEA AND VOMITING THAT IS NOT CONTROLLED WITH YOUR NAUSEA MEDICATION  *UNUSUAL SHORTNESS OF BREATH  *UNUSUAL BRUISING OR BLEEDING  TENDERNESS IN MOUTH AND THROAT WITH OR WITHOUT PRESENCE OF ULCERS  *URINARY PROBLEMS  *BOWEL PROBLEMS  UNUSUAL RASH Items with * indicate a potential emergency and should be followed up as soon as possible.  Feel free to call the clinic should you have any questions or concerns. The clinic phone number is [phone].  Please show the CHEMO ALERT CARD at check-in to the Emergency Department and triage nurse.   

## 2020-05-04 ENCOUNTER — Ambulatory Visit: Payer: Medicare PPO | Attending: Oncology

## 2020-05-04 DIAGNOSIS — M4004 Postural kyphosis, thoracic region: Secondary | ICD-10-CM | POA: Diagnosis present

## 2020-05-04 DIAGNOSIS — Z171 Estrogen receptor negative status [ER-]: Secondary | ICD-10-CM | POA: Insufficient documentation

## 2020-05-04 DIAGNOSIS — C50312 Malignant neoplasm of lower-inner quadrant of left female breast: Secondary | ICD-10-CM | POA: Diagnosis present

## 2020-05-04 NOTE — Therapy (Signed)
[place], [place], [address] Phone: [phone]   Fax:  [phone]  Physical Therapy Evaluation  Patient Details  Name: Donna [name] MRN: [id number] Date of Birth: 12-10-1945 Referring Provider (PT): Dr. Marlou Starks   Encounter Date: 05/04/2020   PT End of Session - 05/04/20 1233    Visit Number 1    Number of Visits 2    Date for PT Re-Evaluation 08/08/20    PT Start Time 0912    PT Stop Time 0957    PT Time Calculation (min) 45 min    Activity Tolerance Patient tolerated treatment well    Behavior During Therapy [facility] for tasks assessed/performed           Past Medical History:  Diagnosis Date  . Arthritis    oa  . History of kidney stones   . Hypertension     Past Surgical History:  Procedure Laterality Date  . CYSTOSCOPY  yrs ago  . IR IMAGING GUIDED PORT INSERTION  02/16/2020  . TOTAL KNEE ARTHROPLASTY Left 06/05/2016   Procedure: LEFT TOTAL KNEE ARTHROPLASTY;  Surgeon: [name], MD;  Location: [facility];  Service: Orthopedics;  Laterality: Left;    There were no vitals filed for this visit.    Subjective Assessment - 05/04/20 0916    Subjective Pt here for baseline pre-op evaluation.  Pt. notes that left shoulder AROM has been limited since childhood when she had a bad fall, but she has never had surgery.    Patient is accompained by: Family member   Pts. daughter   Patient Stated Goals Preop assessment    Currently in Pain? No/denies              [facility] PT Assessment - 05/04/20 0001      Assessment   Medical Diagnosis Malignant neoplasm lower inner quadrant Left breast    Referring Provider (PT) Dr. Marlou Starks    Onset Date/Surgical Date --   don't have a date yet   Prior Therapy --   no     Restrictions   Weight Bearing Restrictions No      Balance Screen   Has the patient fallen in the past 6 months No    Has the patient had a decrease in activity level because of a fear  of falling?  Yes    Is the patient reluctant to leave their home because of a fear of falling?  No      Home Environment   Living Environment Private residence    [place]   living with her daughter in [place] [facility]      Prior Function   Level of [place] Retired    Leisure reading      Cognition   Overall Cognitive Status Within Functional Limits for tasks assessed      Observation/Other Assessments   Skin Integrity good      Posture/Postural Control   Posture/Postural Control Postural limitations    Postural Limitations Rounded Shoulders;Forward head      AROM   Overall AROM Comments Left AROM limited with [place] compensation since childhood    Right Shoulder Extension 42 Degrees    Right Shoulder Flexion 145 Degrees    Right Shoulder ABduction 118 Degrees    Right Shoulder Internal Rotation 55 Degrees    Right Shoulder External Rotation 55 Degrees    Left Shoulder Extension 50 Degrees  Left Shoulder Flexion 80 Degrees   with much compensation from prior injury   Left Shoulder ABduction 58 Degrees   with compensation from prior injury            LYMPHEDEMA/ONCOLOGY QUESTIONNAIRE - 05/04/20 0001      Type   Cancer Type Malignant neoplasm lower inner quadrant Left breast Triple Negative      Surgeries   Mastectomy Date --   dont know yet     Treatment   Active Chemotherapy Treatment Yes    Past Chemotherapy Treatment No    Active Radiation Treatment No    Past Radiation Treatment No    Current Hormone Treatment No    Past Hormone Therapy No      What other symptoms do you have   Are you Having Heaviness or Tightness No    Are you having Pain No    Are you having pitting edema No    Is it Hard or Difficult finding clothes that fit No    Do you have infections No    Is there Decreased scar mobility No    [place] Sign No      Right Upper Extremity Lymphedema   10 cm Proximal  to Olecranon Process 30.5 cm    Olecranon Process 26.4 cm    15 cm Proximal to Ulnar Styloid Process 27.1 cm    10 cm Proximal to Ulnar Styloid Process 23.5 cm    Just Proximal to Ulnar Styloid Process 18.1 cm    Across Hand at [employer] 20.3 cm    At [place] of 2nd Digit 6.8 cm      Left Upper Extremity Lymphedema   10 cm Proximal to Olecranon Process 28.7 cm    Olecranon Process 26 cm    15 cm Proximal to Ulnar Styloid Process 25 cm    10 cm Proximal to Ulnar Styloid Process 21 cm    Just Proximal to Ulnar Styloid Process 17 cm    Across Hand at [employer] 20.5 cm    At [place] of 2nd Digit 6.3 cm                   Objective measurements completed on examination: See above findings.               PT Education - 05/04/20 1231    Education Details Pt. was educated in post op HEP but will peform shoulder AAROM flex and ER in supine secondary to prior shoulder limitations and compensation.  I did not give wall walks for HEP.  She was educated in [facility] program and will schedule after she determines when surgery is.  She was educated in risk factors for lymphedema.    Person(s) Educated Patient;Child(ren)   daughter   Methods Explanation;Demonstration    Comprehension Verbalized understanding;Returned demonstration                Breast Clinic Goals - 05/04/20 1244      Patient will be able to verbalize understanding of pertinent lymphedema risk reduction practices relevant to her diagnosis specifically related to skin care.   Time 1    Period Days    Status Achieved    Target Date 05/04/20      Patient will be able to return demonstrate and/or verbalize understanding of the post-op home exercise program related to regaining shoulder range of motion.   Time 1    Period Days    Status Achieved  Target Date 05/04/20      Patient will be able to verbalize understanding of the importance of attending the postoperative After Breast Cancer Class for  further lymphedema risk reduction education and therapeutic exercise.   Time 1    Period Days    Status Achieved                 Plan - 05/04/20 1235    Clinical Impression Statement Pt was here for pre-op assessment with her daughter.  She is presently having neoadjuvant chemo and surgery is not likely to be scheduled before February.  Pt and her daughter were educated in ABC class, Post op exercises were modified for her to do in supine and wall walk was left out secondary to other shoulder limitations present since childhood.  They were also edcated in [place] and educated in ways to prevent lymphedema.    Personal Factors and Comorbidities Age;Comorbidity 2;Comorbidity 3+    Comorbidities age, recent Cancer diagnosis Triple negative,Left shoulder limitations in AROM pre surgery    Stability/Clinical Decision Making Stable/Uncomplicated    Clinical Decision Making Low    PT Treatment/Interventions Therapeutic exercise;Manual techniques;Patient/family education;Manual lymph drainage;Passive range of motion    PT Next Visit Plan Reassess post surgery    PT Home Exercise Plan 3 post op exs excluding wall walk, flex and ER done in supine    Recommended Other Services ABC class, SOZO 3 months post sx and q 3 months afterwards    Consulted and Agree with Plan of Care Patient;Family member/caregiver    Family Member Consulted daughter         Patient will follow up at outpatient cancer rehab 3-4 weeks following surgery.  If the patient requires physical therapy at that time, a specific plan will be dictated and sent to the referring physician for approval. The patient was educated today on appropriate basic range of motion exercises to begin post operatively and the importance of attending the After Breast Cancer class following surgery.  Patient was educated today on lymphedema risk reduction practices as it pertains to recommendations that will benefit the patient immediately following  surgery.  She verbalized good understanding.       LDex baseline -5.9         Patient will benefit from skilled therapeutic intervention in order to improve the following deficits and impairments:  Decreased knowledge of precautions, Decreased range of motion, Decreased strength, Impaired UE functional use, Postural dysfunction  Visit Diagnosis: Malignant neoplasm of lower-inner quadrant of left breast in female, estrogen receptor negative (HCC)  Postural kyphosis of thoracic region     Problem List Patient Active Problem List   Diagnosis Date Noted  . Port-A-Cath in place 03/22/2020  . Malignant neoplasm of upper-inner quadrant of left breast in female, estrogen receptor negative ([place]) 02/04/2020  . Metaplastic carcinoma of breast ([place]) 02/04/2020  . Overweight (BMI 25.0-29.9) 06/06/2016  . Hypokalemia 06/06/2016  . 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

## 2020-05-04 NOTE — Patient Instructions (Signed)

## 2020-05-09 NOTE — Progress Notes (Signed)
[place]  Telephone:([phone]) [phone] Fax:(336) [phone]     ID: Donna [name] DOB: 1946-05-12  MR#: [id number]  GPQ#:[id number]  Patient Care Team: Montez Morita, PA-C as PCP - General (Hematology and Oncology) Antowan Samford, [name], MD as Consulting Physician (Oncology) [name], MD as Consulting Physician (General Surgery) [name], MD as Consulting Physician (Orthopedic Surgery) [name], RN as Oncology Nurse Navigator [name], RN as Oncology Nurse Navigator Chauncey Cruel, MD OTHER MD: [name] MD ([place]); [name] NP   CHIEF COMPLAINT: Triple negative breast cancer  CURRENT TREATMENT: neoadjuvant chemotherapy   INTERVAL HISTORY: Donna [name] returns today for follow up and treatment of her triple negative breast cancer accompanied by her daughter  She began carboplatin and paclitaxel 04/19/2020. Today is week 4 out of 12 planned.   She tolerated the treatment remarkably well.  She continues to tolerated well aside from taste issues.  She tells me that food feels "fresh".  She can eat soup fish beans and nuts.  She cannot eat chicken or greens.  Sometimes she feels chilly which is unusual for her.  She is not exercising on a regular basis.   REVIEW OF SYSTEMS: A detailed review of systems today was stable except as noted above   COVID 19 VACCINATION STATUS: s/p Moderna x2, with booster pending   HISTORY OF CURRENT ILLNESS: From the original intake note:  Donna [name] herself palpated a mass in her left breast during showering.  She brought this to medical attention and on 12/16/2019 underwent mammography, the results of which I do not have today.  She proceeded to left breast ultrasound showing breast density B.  There was a mass in the upper inner quadrant of the left breast at the 10 o'clock position 7 cm from the nipple.  It measured 3.26 cm.  There are no ultrasound comments regarding the axilla.  Biopsy of the mass in  question was performed 01/15/2020 at [facility] and the pathology report describes a biphasic tumor with malignant epithelial component and a mesenchymal component.  The epithelial component appears to squamoid and stain strongly for CK 8/18 by immunohistochemistry.  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  The tumor was essentially triple negative, estrogen receptor 0%, progesterone receptor 1% with 1+ intensity, HER-2 1+ by immunohistochemistry, Ki-67 12%.  (The 1% progesterone receptor positivity is best read as an artifact as the progesterone receptor does not function in the absence of a functional estrogen receptor).  The patient's subsequent history is as detailed below   PAST MEDICAL HISTORY: Past Medical History:  Diagnosis Date  . Arthritis    oa  . History of kidney stones   . Hypertension     PAST SURGICAL HISTORY: Past Surgical History:  Procedure Laterality Date  . CYSTOSCOPY  yrs ago  . IR IMAGING GUIDED PORT INSERTION  02/16/2020  . TOTAL KNEE ARTHROPLASTY Left 06/05/2016   Procedure: LEFT TOTAL KNEE ARTHROPLASTY;  Surgeon: [name], MD;  Location: [facility];  Service: Orthopedics;  Laterality: Left;    FAMILY HISTORY No family history on file.  The patient has no information on her father.  Her mother died at the age of 63.  She had 1 sister and 2 brothers.  No one on that side of the family is known to have had cancer.  The patient herself had 5/2 siblings, 2 sisters and 3 brothers, none  with a history of cancer.   GYNECOLOGIC HISTORY:  No LMP recorded. Patient is postmenopausal. Menarche: 74 years old Age at first live birth: 74 years old [place] P 2 LMP mid 26s Contraceptive no HRT no  Hysterectomy?  No Salpingo-oophorectomy?  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  She is divorced and her former husband  subsequently died.  She lives by herself with no pets.  Her son Donna [name] 61 is a Production manager and travels a great deal as a Optometrist.  Daughter Donna [name] teaches middle school in [place].  The patient has 4 grandchildren, no great-grandchildren.  She is a Psychologist, forensic.    ADVANCED DIRECTIVES: Not in place   HEALTH MAINTENANCE: Social History   Tobacco Use  . Smoking status: Never Smoker  . Smokeless tobacco: Never Used  Substance Use Topics  . Alcohol use: No  . Drug use: No     Colonoscopy: Never  PAP: Remote  Bone density: Remote   Allergies  Allergen Reactions  . Benazepril Swelling    Patient had severe angioedema on 10/05/2015.    Current Outpatient Medications  Medication Sig Dispense Refill  . amLODipine (NORVASC) 10 MG tablet Take 10 mg by mouth daily after lunch.     Marland Kitchen aspirin 81 MG chewable tablet Chew 1 tablet (81 mg total) by mouth 2 (two) times daily. Take for 4 weeks. 60 tablet 0  . chlorthalidone (HYGROTON) 25 MG tablet Take 25 mg by mouth daily after lunch.     . Cholecalciferol (VITAMIN D) 2000 units CAPS Take 2,000 Units by mouth daily after lunch.    . dexamethasone (DECADRON) 4 MG tablet Take 2 tablets by mouth daily starting the day after Carboplatin and Cytoxan x 3 days. Take with food. 30 tablet 1  . lidocaine-prilocaine (EMLA) cream Apply to affected area once 30 g 3  . loratadine (CLARITIN) 10 MG tablet Take 1 tablet (10 mg total) by mouth daily. Starting the day after chemotherapy for 10 days, then as needed. 30 tablet 3  . losartan (COZAAR) 50 MG tablet Take 50 mg by mouth daily after lunch.    . Multiple Vitamins-Minerals (CENTRUM ADULTS) TABS 1 tab(s)    . potassium chloride (MICRO-K) 10 MEQ CR capsule Take 10 mEq by mouth 2 (two) times daily.    . prochlorperazine (COMPAZINE) 10 MG tablet Take 1 tablet (10 mg total) by mouth every 6 (six) hours as needed (Nausea or vomiting). 30 tablet 1   No current facility-administered medications for this  visit.    OBJECTIVE: African-American woman who appears stated age 74:   05/10/20 0839  BP: 136/85  Pulse: 91  Resp: 18  Temp: 97.8 F (36.6 C)  SpO2: 100%     Body mass index is 27.28 kg/m.   Wt Readings from Last 3 Encounters:  05/10/20 154 lb (69.9 kg)  05/03/20 155 lb 12 oz (70.6 kg)  04/26/20 151 lb 4.8 oz (68.6 kg)      ECOG FS:1 - Symptomatic but completely ambulatory  Sclerae unicteric, EOMs intact Wearing a mask No cervical or supraclavicular adenopathy Lungs no rales or rhonchi Heart regular rate and rhythm Abd soft, nontender, positive bowel sounds MSK no focal spinal tenderness, no upper extremity lymphedema Neuro: nonfocal, well oriented, appropriate affect Breasts: The left breast mass is still palpable although it may be somewhat softer.  It does not involve the skin.   LAB RESULTS:  CMP  Component Value Date/Time   NA 137 05/10/2020 0828   K 3.0 (L) 05/10/2020 0828   CL 104 05/10/2020 0828   CO2 25 05/10/2020 0828   GLUCOSE 151 (H) 05/10/2020 0828   BUN 12 05/10/2020 0828   CREATININE 0.77 05/10/2020 0828   CALCIUM 10.4 (H) 05/10/2020 0828   PROT 6.2 (L) 05/10/2020 0828   ALBUMIN 3.3 (L) 05/10/2020 0828   AST 14 (L) 05/10/2020 0828   ALT 13 05/10/2020 0828   ALKPHOS 69 05/10/2020 0828   BILITOT 0.5 05/10/2020 0828   GFRNONAA >60 05/10/2020 0828   GFRAA >60 03/08/2020 0906    No results found for: TOTALPROTELP, ALBUMINELP, A1GS, A2GS, BETS, BETA2SER, GAMS, MSPIKE, SPEI  No results found for: Nils Pyle, [facility]  Lab Results  Component Value Date   WBC 3.3 (L) 05/10/2020   NEUTROABS 2.3 05/10/2020   HGB 12.0 05/10/2020   HCT 35.7 (L) 05/10/2020   MCV 92.7 05/10/2020   PLT 287 05/10/2020      Chemistry      Component Value Date/Time   NA 137 05/10/2020 0828   K 3.0 (L) 05/10/2020 0828   CL 104 05/10/2020 0828   CO2 25 05/10/2020 0828   BUN 12 05/10/2020 0828   CREATININE 0.77 05/10/2020 0828        Component Value Date/Time   CALCIUM 10.4 (H) 05/10/2020 0828   ALKPHOS 69 05/10/2020 0828   AST 14 (L) 05/10/2020 0828   ALT 13 05/10/2020 0828   BILITOT 0.5 05/10/2020 0828       No results found for: LABCA2  No components found for: RWERXV400  No results for input(s): INR in the last 168 hours.  No results found for: LABCA2  No results found for: QQP619  No results found for: JKD326  No results found for: ZTI458  No results found for: CA2729  No components found for: HGQUANT  No results found for: CEA1 / No results found for: CEA1   No results found for: AFPTUMOR  No results found for: CHROMOGRNA  No results found for: HGBA, HGBA2QUANT, HGBFQUANT, HGBSQUAN (Hemoglobinopathy evaluation)   No results found for: LDH  No results found for: IRON, TIBC, IRONPCTSAT (Iron and TIBC)  No results found for: FERRITIN  Urinalysis No results found for: COLORURINE, APPEARANCEUR, LABSPEC, PHURINE, GLUCOSEU, HGBUR, BILIRUBINUR, KETONESUR, PROTEINUR, UROBILINOGEN, NITRITE, LEUKOCYTESUR   STUDIES: No results found.   ELIGIBLE FOR AVAILABLE RESEARCH PROTOCOL: no  ASSESSMENT: 74 y.o. [place] woman status post left breast upper inner quadrant biopsy 01/15/2020 for a clinical T2 NX, stage IIB anaplastic carcinoma, triple negative, with an MIB-1 of 12%  (a) bone scan and CT chest 02/12/2020 show no evidence of metastatic disease  (1) neoadjuvant chemotherapy with doxorubicin and cyclophosphamide in dose dense fashion x4 started 02/23/2020, followed by weekly paclitaxel and carboplatin x12 starting 04/19/2020  (a) echo 02/12/2020 shows an ejection fraction in the 60-65% range  (2) definitive surgery to follow  (3) adjuvant radiation as appropriate   PLAN: Carnell is tolerating chemo well.  Specifically she has no symptoms of peripheral neuropathy.  I do not have a good way to help her with the taste perversion but we talked about some slight cooking changes  that she could try.  I encouraged her to exercise more regularly  We will continue to treat her on a weekly basis and see her every other treatment.  She knows to alert [place] if any new symptoms develop  Total encounter time 25 minutes.  Chauncey Cruel, MD   05/10/2020 7:19 PM Medical Oncology and Hematology [facility] [place], [place] [address] Tel. [phone]    Fax. [phone]   I, Wilburn Mylar, am acting as scribe for Dr. [name]. Viraj Liby.  I, Lurline Del MD, have reviewed the above documentation for accuracy and completeness, and I agree with the above.   *Total Encounter Time as defined by the Centers for Medicare and Medicaid Services includes, in addition to the face-to-face time of a patient visit (documented in the note above) non-face-to-face time: obtaining and reviewing outside history, ordering and reviewing medications, tests or procedures, care coordination (communications with other health care professionals or caregivers) and documentation in the medical record.

## 2020-05-10 ENCOUNTER — Inpatient Hospital Stay: Payer: Medicare PPO

## 2020-05-10 ENCOUNTER — Other Ambulatory Visit: Payer: Self-pay

## 2020-05-10 ENCOUNTER — Inpatient Hospital Stay (HOSPITAL_BASED_OUTPATIENT_CLINIC_OR_DEPARTMENT_OTHER): Payer: Medicare PPO | Admitting: Oncology

## 2020-05-10 VITALS — BP 136/85 | HR 91 | Temp 97.8°F | Resp 18 | Ht 63.0 in | Wt 154.0 lb

## 2020-05-10 DIAGNOSIS — C50212 Malignant neoplasm of upper-inner quadrant of left female breast: Secondary | ICD-10-CM

## 2020-05-10 DIAGNOSIS — Z95828 Presence of other vascular implants and grafts: Secondary | ICD-10-CM

## 2020-05-10 DIAGNOSIS — C50919 Malignant neoplasm of unspecified site of unspecified female breast: Secondary | ICD-10-CM | POA: Diagnosis not present

## 2020-05-10 DIAGNOSIS — Z171 Estrogen receptor negative status [ER-]: Secondary | ICD-10-CM

## 2020-05-10 DIAGNOSIS — Z5111 Encounter for antineoplastic chemotherapy: Secondary | ICD-10-CM | POA: Diagnosis not present

## 2020-05-10 LAB — CBC WITH DIFFERENTIAL (CANCER CENTER ONLY)
Abs Immature Granulocytes: 0.03 10*3/uL (ref 0.00–0.07)
Basophils Absolute: 0 10*3/uL (ref 0.0–0.1)
Basophils Relative: 1 %
Eosinophils Absolute: 0 10*3/uL (ref 0.0–0.5)
Eosinophils Relative: 0 %
HCT: 35.7 % — ABNORMAL LOW (ref 36.0–46.0)
Hemoglobin: 12 g/dL (ref 12.0–15.0)
Immature Granulocytes: 1 %
Lymphocytes Relative: 17 %
Lymphs Abs: 0.6 10*3/uL — ABNORMAL LOW (ref 0.7–4.0)
MCH: 31.2 pg (ref 26.0–34.0)
MCHC: 33.6 g/dL (ref 30.0–36.0)
MCV: 92.7 fL (ref 80.0–100.0)
Monocytes Absolute: 0.4 10*3/uL (ref 0.1–1.0)
Monocytes Relative: 11 %
Neutro Abs: 2.3 10*3/uL (ref 1.7–7.7)
Neutrophils Relative %: 70 %
Platelet Count: 287 10*3/uL (ref 150–400)
RBC: 3.85 MIL/uL — ABNORMAL LOW (ref 3.87–5.11)
RDW: 17.8 % — ABNORMAL HIGH (ref 11.5–15.5)
WBC Count: 3.3 10*3/uL — ABNORMAL LOW (ref 4.0–10.5)
nRBC: 0 % (ref 0.0–0.2)

## 2020-05-10 LAB — CMP (CANCER CENTER ONLY)
ALT: 13 U/L (ref 0–44)
AST: 14 U/L — ABNORMAL LOW (ref 15–41)
Albumin: 3.3 g/dL — ABNORMAL LOW (ref 3.5–5.0)
Alkaline Phosphatase: 69 U/L (ref 38–126)
Anion gap: 8 (ref 5–15)
BUN: 12 mg/dL (ref 8–23)
CO2: 25 mmol/L (ref 22–32)
Calcium: 10.4 mg/dL — ABNORMAL HIGH (ref 8.9–10.3)
Chloride: 104 mmol/L (ref 98–111)
Creatinine: 0.77 mg/dL (ref 0.44–1.00)
GFR, Estimated: >60 mL/min (ref >60)
Glucose, Bld: 151 mg/dL — ABNORMAL HIGH (ref 70–99)
Potassium: 3 mmol/L — ABNORMAL LOW (ref 3.5–5.1)
Sodium: 137 mmol/L (ref 135–145)
Total Bilirubin: 0.5 mg/dL (ref 0.3–1.2)
Total Protein: 6.2 g/dL — ABNORMAL LOW (ref 6.5–8.1)

## 2020-05-10 MED ORDER — HEPARIN SOD (PORK) LOCK FLUSH 100 UNIT/ML IV SOLN
500.0000 [IU] | Freq: Once | INTRAVENOUS | Status: AC | PRN
  Administered 2020-05-10: 500 [IU]
  Filled 2020-05-10: qty 5

## 2020-05-10 MED ORDER — SODIUM CHLORIDE 0.9 % IV SOLN
Freq: Once | INTRAVENOUS | Status: AC
  Filled 2020-05-10: qty 250

## 2020-05-10 MED ORDER — FAMOTIDINE IN NACL 20-0.9 MG/50ML-% IV SOLN
INTRAVENOUS | Status: AC
  Filled 2020-05-10: qty 50

## 2020-05-10 MED ORDER — DEXAMETHASONE 4 MG PO TABS: ORAL_TABLET | ORAL | 1 refills | Status: DC

## 2020-05-10 MED ORDER — SODIUM CHLORIDE 0.9 % IV SOLN
164.4000 mg | Freq: Once | INTRAVENOUS | Status: AC
  Administered 2020-05-10: 160 mg via INTRAVENOUS
  Filled 2020-05-10: qty 16

## 2020-05-10 MED ORDER — PALONOSETRON HCL INJECTION 0.25 MG/5ML
0.2500 mg | Freq: Once | INTRAVENOUS | Status: AC
  Administered 2020-05-10: 0.25 mg via INTRAVENOUS

## 2020-05-10 MED ORDER — SODIUM CHLORIDE 0.9 % IV SOLN
10.0000 mg | Freq: Once | INTRAVENOUS | Status: AC
  Administered 2020-05-10: 10 mg via INTRAVENOUS
  Filled 2020-05-10: qty 10

## 2020-05-10 MED ORDER — DIPHENHYDRAMINE HCL 50 MG/ML IJ SOLN
25.0000 mg | Freq: Once | INTRAMUSCULAR | Status: AC
  Administered 2020-05-10: 25 mg via INTRAVENOUS

## 2020-05-10 MED ORDER — SODIUM CHLORIDE 0.9% FLUSH
10.0000 mL | INTRAVENOUS | Status: DC | PRN
  Administered 2020-05-10: 10 mL
  Filled 2020-05-10: qty 10

## 2020-05-10 MED ORDER — FAMOTIDINE IN NACL 20-0.9 MG/50ML-% IV SOLN
20.0000 mg | Freq: Once | INTRAVENOUS | Status: AC
  Administered 2020-05-10: 20 mg via INTRAVENOUS

## 2020-05-10 MED ORDER — PALONOSETRON HCL INJECTION 0.25 MG/5ML
INTRAVENOUS | Status: AC
  Filled 2020-05-10: qty 5

## 2020-05-10 MED ORDER — DIPHENHYDRAMINE HCL 50 MG/ML IJ SOLN
INTRAMUSCULAR | Status: AC
  Filled 2020-05-10: qty 1

## 2020-05-10 MED ORDER — SODIUM CHLORIDE 0.9 % IV SOLN
80.0000 mg/m2 | Freq: Once | INTRAVENOUS | Status: AC
  Administered 2020-05-10: 144 mg via INTRAVENOUS
  Filled 2020-05-10: qty 24

## 2020-05-10 MED ORDER — LIDOCAINE-PRILOCAINE 2.5-2.5 % EX CREA: TOPICAL_CREAM | CUTANEOUS | 3 refills | Status: DC

## 2020-05-10 NOTE — Progress Notes (Signed)
Pt discharged in stable condition.

## 2020-05-10 NOTE — Patient Instructions (Signed)
[facility] Discharge Instructions for Patients Receiving Chemotherapy  Today you received the following chemotherapy agents: taxol, carboplatin   To help prevent nausea and vomiting after your treatment, we encourage you to take your nausea medication as directed.    If you develop nausea and vomiting that is not controlled by your nausea medication, call the clinic.   BELOW ARE SYMPTOMS THAT SHOULD BE REPORTED IMMEDIATELY:  *FEVER GREATER THAN 100.5 F  *CHILLS WITH OR WITHOUT FEVER  NAUSEA AND VOMITING THAT IS NOT CONTROLLED WITH YOUR NAUSEA MEDICATION  *UNUSUAL SHORTNESS OF BREATH  *UNUSUAL BRUISING OR BLEEDING  TENDERNESS IN MOUTH AND THROAT WITH OR WITHOUT PRESENCE OF ULCERS  *URINARY PROBLEMS  *BOWEL PROBLEMS  UNUSUAL RASH Items with * indicate a potential emergency and should be followed up as soon as possible.  Feel free to call the clinic should you have any questions or concerns. The clinic phone number is [phone].  Please show the CHEMO ALERT CARD at check-in to the Emergency Department and triage nurse.   

## 2020-05-17 ENCOUNTER — Inpatient Hospital Stay: Payer: Medicare PPO

## 2020-05-17 ENCOUNTER — Inpatient Hospital Stay: Payer: Medicare PPO | Attending: Oncology

## 2020-05-17 ENCOUNTER — Other Ambulatory Visit: Payer: Self-pay

## 2020-05-17 VITALS — BP 134/84 | HR 98 | Temp 98.5°F | Resp 16 | Wt 154.1 lb

## 2020-05-17 DIAGNOSIS — Z5111 Encounter for antineoplastic chemotherapy: Secondary | ICD-10-CM | POA: Diagnosis not present

## 2020-05-17 DIAGNOSIS — Z171 Estrogen receptor negative status [ER-]: Secondary | ICD-10-CM

## 2020-05-17 DIAGNOSIS — Z7952 Long term (current) use of systemic steroids: Secondary | ICD-10-CM | POA: Diagnosis not present

## 2020-05-17 DIAGNOSIS — Z7982 Long term (current) use of aspirin: Secondary | ICD-10-CM | POA: Insufficient documentation

## 2020-05-17 DIAGNOSIS — C50919 Malignant neoplasm of unspecified site of unspecified female breast: Secondary | ICD-10-CM

## 2020-05-17 DIAGNOSIS — Z79899 Other long term (current) drug therapy: Secondary | ICD-10-CM | POA: Insufficient documentation

## 2020-05-17 DIAGNOSIS — C50212 Malignant neoplasm of upper-inner quadrant of left female breast: Secondary | ICD-10-CM | POA: Diagnosis present

## 2020-05-17 DIAGNOSIS — Z87442 Personal history of urinary calculi: Secondary | ICD-10-CM | POA: Insufficient documentation

## 2020-05-17 DIAGNOSIS — I10 Essential (primary) hypertension: Secondary | ICD-10-CM | POA: Diagnosis not present

## 2020-05-17 DIAGNOSIS — Z95828 Presence of other vascular implants and grafts: Secondary | ICD-10-CM

## 2020-05-17 LAB — CMP (CANCER CENTER ONLY)
ALT: 17 U/L (ref 0–44)
AST: 19 U/L (ref 15–41)
Albumin: 3.3 g/dL — ABNORMAL LOW (ref 3.5–5.0)
Alkaline Phosphatase: 80 U/L (ref 38–126)
Anion gap: 5 (ref 5–15)
BUN: 11 mg/dL (ref 8–23)
CO2: 28 mmol/L (ref 22–32)
Calcium: 11 mg/dL — ABNORMAL HIGH (ref 8.9–10.3)
Chloride: 103 mmol/L (ref 98–111)
Creatinine: 0.82 mg/dL (ref 0.44–1.00)
GFR, Estimated: >60 mL/min (ref >60)
Glucose, Bld: 148 mg/dL — ABNORMAL HIGH (ref 70–99)
Potassium: 3.1 mmol/L — ABNORMAL LOW (ref 3.5–5.1)
Sodium: 136 mmol/L (ref 135–145)
Total Bilirubin: 0.4 mg/dL (ref 0.3–1.2)
Total Protein: 6.4 g/dL — ABNORMAL LOW (ref 6.5–8.1)

## 2020-05-17 LAB — CBC WITH DIFFERENTIAL (CANCER CENTER ONLY)
Abs Immature Granulocytes: 0.01 10*3/uL (ref 0.00–0.07)
Basophils Absolute: 0 10*3/uL (ref 0.0–0.1)
Basophils Relative: 1 %
Eosinophils Absolute: 0 10*3/uL (ref 0.0–0.5)
Eosinophils Relative: 0 %
HCT: 33.1 % — ABNORMAL LOW (ref 36.0–46.0)
Hemoglobin: 11.2 g/dL — ABNORMAL LOW (ref 12.0–15.0)
Immature Granulocytes: 0 %
Lymphocytes Relative: 23 %
Lymphs Abs: 0.7 10*3/uL (ref 0.7–4.0)
MCH: 31.4 pg (ref 26.0–34.0)
MCHC: 33.8 g/dL (ref 30.0–36.0)
MCV: 92.7 fL (ref 80.0–100.0)
Monocytes Absolute: 0.3 10*3/uL (ref 0.1–1.0)
Monocytes Relative: 10 %
Neutro Abs: 1.9 10*3/uL (ref 1.7–7.7)
Neutrophils Relative %: 66 %
Platelet Count: 238 10*3/uL (ref 150–400)
RBC: 3.57 MIL/uL — ABNORMAL LOW (ref 3.87–5.11)
RDW: 16.9 % — ABNORMAL HIGH (ref 11.5–15.5)
WBC Count: 3 10*3/uL — ABNORMAL LOW (ref 4.0–10.5)
nRBC: 0 % (ref 0.0–0.2)

## 2020-05-17 MED ORDER — SODIUM CHLORIDE 0.9% FLUSH
10.0000 mL | INTRAVENOUS | Status: DC | PRN
  Filled 2020-05-17: qty 10

## 2020-05-17 MED ORDER — PALONOSETRON HCL INJECTION 0.25 MG/5ML
INTRAVENOUS | Status: AC
  Filled 2020-05-17: qty 5

## 2020-05-17 MED ORDER — SODIUM CHLORIDE 0.9 % IV SOLN
164.4000 mg | Freq: Once | INTRAVENOUS | Status: AC
  Administered 2020-05-17: 160 mg via INTRAVENOUS
  Filled 2020-05-17: qty 16

## 2020-05-17 MED ORDER — DIPHENHYDRAMINE HCL 50 MG/ML IJ SOLN
25.0000 mg | Freq: Once | INTRAMUSCULAR | Status: AC
  Administered 2020-05-17: 25 mg via INTRAVENOUS

## 2020-05-17 MED ORDER — SODIUM CHLORIDE 0.9% FLUSH
10.0000 mL | INTRAVENOUS | Status: DC | PRN
  Administered 2020-05-17: 10 mL
  Filled 2020-05-17: qty 10

## 2020-05-17 MED ORDER — SODIUM CHLORIDE 0.9 % IV SOLN
80.0000 mg/m2 | Freq: Once | INTRAVENOUS | Status: AC
  Administered 2020-05-17: 144 mg via INTRAVENOUS
  Filled 2020-05-17: qty 24

## 2020-05-17 MED ORDER — DIPHENHYDRAMINE HCL 50 MG/ML IJ SOLN
INTRAMUSCULAR | Status: AC
  Filled 2020-05-17: qty 1

## 2020-05-17 MED ORDER — FAMOTIDINE IN NACL 20-0.9 MG/50ML-% IV SOLN
20.0000 mg | Freq: Once | INTRAVENOUS | Status: AC
  Administered 2020-05-17: 20 mg via INTRAVENOUS

## 2020-05-17 MED ORDER — SODIUM CHLORIDE 0.9 % IV SOLN
Freq: Once | INTRAVENOUS | Status: AC
  Filled 2020-05-17: qty 250

## 2020-05-17 MED ORDER — HEPARIN SOD (PORK) LOCK FLUSH 100 UNIT/ML IV SOLN
500.0000 [IU] | Freq: Once | INTRAVENOUS | Status: DC | PRN
  Filled 2020-05-17: qty 5

## 2020-05-17 MED ORDER — SODIUM CHLORIDE 0.9 % IV SOLN
10.0000 mg | Freq: Once | INTRAVENOUS | Status: AC
  Administered 2020-05-17: 10 mg via INTRAVENOUS
  Filled 2020-05-17: qty 10

## 2020-05-17 MED ORDER — FAMOTIDINE IN NACL 20-0.9 MG/50ML-% IV SOLN
INTRAVENOUS | Status: AC
  Filled 2020-05-17: qty 50

## 2020-05-17 MED ORDER — PALONOSETRON HCL INJECTION 0.25 MG/5ML
0.2500 mg | Freq: Once | INTRAVENOUS | Status: AC
  Administered 2020-05-17: 0.25 mg via INTRAVENOUS

## 2020-05-17 NOTE — Patient Instructions (Signed)
[facility] Discharge Instructions for Patients Receiving Chemotherapy  Today you received the following chemotherapy agents: paclitaxel and carboplatin.  To help prevent nausea and vomiting after your treatment, we encourage you to take your nausea medication as directed.   If you develop nausea and vomiting that is not controlled by your nausea medication, call the clinic.   BELOW ARE SYMPTOMS THAT SHOULD BE REPORTED IMMEDIATELY:  *FEVER GREATER THAN 100.5 F  *CHILLS WITH OR WITHOUT FEVER  NAUSEA AND VOMITING THAT IS NOT CONTROLLED WITH YOUR NAUSEA MEDICATION  *UNUSUAL SHORTNESS OF BREATH  *UNUSUAL BRUISING OR BLEEDING  TENDERNESS IN MOUTH AND THROAT WITH OR WITHOUT PRESENCE OF ULCERS  *URINARY PROBLEMS  *BOWEL PROBLEMS  UNUSUAL RASH Items with * indicate a potential emergency and should be followed up as soon as possible.  Feel free to call the clinic should you have any questions or concerns. The clinic phone number is [phone].  Please show the CHEMO ALERT CARD at check-in to the Emergency Department and triage nurse.   

## 2020-05-17 NOTE — Progress Notes (Signed)
Pt discharged in no apparent distress. Pt left ambulatory without assistance. Pt aware of discharge instructions and verbalized understanding and had no further questions.  

## 2020-05-23 NOTE — Progress Notes (Signed)
[place]  [phone] [phone]     ID: Donna [name] DOB: [birth year]  MR#: [id number]  DXI#:[id number]  Patient Care Team: Montez Morita, PA-C as PCP - General (Hematology and Oncology) Jaymin Waln, [name], MD as Consulting Physician (Oncology) [name], MD as Consulting Physician (General Surgery) [name], MD as Consulting Physician (Orthopedic Surgery) [name], RN as Oncology Nurse Navigator [name], RN as Oncology Nurse Navigator Chauncey Cruel, MD OTHER MD: [name] MD ([place]); [name] NP   CHIEF COMPLAINT: Triple negative breast cancer  CURRENT TREATMENT: neoadjuvant chemotherapy   INTERVAL HISTORY: Donna [name] returns today for follow up and treatment of her triple negative breast cancer accompanied by her son  She began carboplatin and paclitaxel 04/19/2020. Today is week 6 out of 12 planned.    REVIEW OF SYSTEMS: Donna [name] is tolerating treatment generally well.  This time however instead of doing "good" she says she is doing "pretty good".  She has had some low back pain.  This comes and goes.  If it happens she sits down and then she after a while gets going again.  More importantly she is having a little bit of frequency and dysuria.  She has had no fever and she has seen no blood in her urine.  She does have a history of frequent urinary tract infections.  A detailed review of systems today was stable except as noted above   COVID 19 VACCINATION STATUS: s/p Moderna x2, with booster pending   HISTORY OF CURRENT ILLNESS: From the original intake note:  Donna [name] herself palpated a mass in her left breast during showering.  She brought this to medical attention and on 12/16/2019 underwent mammography, the results of which I do not have today.  She proceeded to left breast ultrasound showing breast density B.  There was a mass in the upper inner quadrant of the left breast at the 10 o'clock  position 7 cm from the nipple.  It measured 3.26 cm.  There are no ultrasound comments regarding the axilla.  Biopsy of the mass in question was performed 01/15/2020 at [facility] in [facility] and the pathology report describes a biphasic tumor with malignant epithelial component and a mesenchymal component.  The epithelial component appears to squamoid and stain strongly for CK 8/18 by immunohistochemistry.  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  The tumor was essentially triple negative, estrogen receptor 0%, progesterone receptor 1% with 1+ intensity, HER-2 1+ by immunohistochemistry, Ki-67 12%.  (The 1% progesterone receptor positivity is best read as an artifact as the progesterone receptor does not function in the absence of a functional estrogen receptor).  The patient's subsequent history is as detailed below   PAST MEDICAL HISTORY: Past Medical History:  Diagnosis Date  . Arthritis    oa  . History of kidney stones   . Hypertension     PAST SURGICAL HISTORY: Past Surgical History:  Procedure Laterality Date  . CYSTOSCOPY  yrs ago  . IR IMAGING GUIDED PORT INSERTION  02/16/2020  . TOTAL KNEE ARTHROPLASTY Left 06/05/2016   Procedure: LEFT TOTAL KNEE ARTHROPLASTY;  Surgeon: [name], MD;  Location: [facility];  Service: Orthopedics;  Laterality: Left;    FAMILY HISTORY No family history on file.  The patient has no information on her father.  Her mother died at the age of 7.  She had 1 sister and  2 brothers.  No one on that side of the family is known to have had cancer.  The patient herself had 5/2 siblings, 2 sisters and 3 brothers, none with a history of cancer.   GYNECOLOGIC HISTORY:  No LMP recorded. Patient is postmenopausal. Menarche: 74 years old Age at first live birth: 74 years old [place] P 2 LMP mid 93s Contraceptive no HRT no  Hysterectomy?  No Salpingo-oophorectomy?   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  She is divorced and her former husband subsequently died.  She lives by herself with no pets.  Her son Donna [name] 22 is a Production manager and travels a great deal as a Optometrist.  Daughter Donna [name] teaches middle school in [place].  The patient has 4 grandchildren, no great-grandchildren.  She is a Psychologist, forensic.    ADVANCED DIRECTIVES: Not in place   HEALTH MAINTENANCE: Social History   Tobacco Use  . Smoking status: Never Smoker  . Smokeless tobacco: Never Used  Substance Use Topics  . Alcohol use: No  . Drug use: No     Colonoscopy: Never  PAP: Remote  Bone density: Remote   Allergies  Allergen Reactions  . Benazepril Swelling    Patient had severe angioedema on 10/05/2015.    Current Outpatient Medications  Medication Sig Dispense Refill  . amLODipine (NORVASC) 10 MG tablet Take 10 mg by mouth daily after lunch.     Marland Kitchen aspirin 81 MG chewable tablet Chew 1 tablet (81 mg total) by mouth 2 (two) times daily. Take for 4 weeks. 60 tablet 0  . chlorthalidone (HYGROTON) 25 MG tablet Take 25 mg by mouth daily after lunch.     . Cholecalciferol (VITAMIN D) 2000 units CAPS Take 2,000 Units by mouth daily after lunch.    . dexamethasone (DECADRON) 4 MG tablet Take 2 tablets by mouth daily starting the day after Carboplatin and Cytoxan x 3 days. Take with food. 30 tablet 1  . lidocaine-prilocaine (EMLA) cream Apply to affected area once 30 g 3  . loratadine (CLARITIN) 10 MG tablet Take 1 tablet (10 mg total) by mouth daily. Starting the day after chemotherapy for 10 days, then as needed. 30 tablet 3  . losartan (COZAAR) 50 MG tablet Take 50 mg by mouth daily after lunch.    . Multiple Vitamins-Minerals (CENTRUM ADULTS) TABS 1 tab(s)    . potassium chloride (MICRO-K) 10 MEQ CR capsule Take 10 mEq by mouth 2 (two) times daily.    . prochlorperazine (COMPAZINE) 10 MG tablet Take 1 tablet (10 mg  total) by mouth every 6 (six) hours as needed (Nausea or vomiting). 30 tablet 1   No current facility-administered medications for this visit.    OBJECTIVE: African-American woman who appears stated age 10:   05/24/20 0907  BP: (!) 158/90  Pulse: (!) 107  Resp: 18  Temp: 97.8 F (36.6 C)  SpO2: 100%     Body mass index is 26.55 kg/m.   Wt Readings from Last 3 Encounters:  05/24/20 149 lb 14.4 oz (68 kg)  05/17/20 154 lb 2 oz (69.9 kg)  05/10/20 154 lb (69.9 kg)      ECOG FS:1 - Symptomatic but completely ambulatory  Sclerae unicteric, EOMs intact Wearing a mask No cervical or supraclavicular adenopathy Lungs no rales or rhonchi Heart regular rate and rhythm Abd soft, nontender, positive bowel sounds MSK no focal spinal tenderness, no upper extremity lymphedema Neuro:  nonfocal, well oriented, appropriate affect Breasts: Deferred   LAB RESULTS:  CMP     Component Value Date/Time   NA 138 05/24/2020 0846   K 3.2 (L) 05/24/2020 0846   CL 104 05/24/2020 0846   CO2 25 05/24/2020 0846   GLUCOSE 155 (H) 05/24/2020 0846   BUN 11 05/24/2020 0846   CREATININE 0.79 05/24/2020 0846   CALCIUM 11.3 (H) 05/24/2020 0846   PROT 6.6 05/24/2020 0846   ALBUMIN 3.5 05/24/2020 0846   AST 16 05/24/2020 0846   ALT 20 05/24/2020 0846   ALKPHOS 87 05/24/2020 0846   BILITOT 0.5 05/24/2020 0846   GFRNONAA >60 05/24/2020 0846   GFRAA >60 03/08/2020 0906    No results found for: TOTALPROTELP, ALBUMINELP, A1GS, A2GS, BETS, BETA2SER, GAMS, MSPIKE, SPEI  No results found for: Nils Pyle, [facility]  Lab Results  Component Value Date   WBC 2.3 (L) 05/24/2020   NEUTROABS 1.1 (L) 05/24/2020   HGB 11.8 (L) 05/24/2020   HCT 34.5 (L) 05/24/2020   MCV 93.5 05/24/2020   PLT 288 05/24/2020      Chemistry      Component Value Date/Time   NA 138 05/24/2020 0846   K 3.2 (L) 05/24/2020 0846   CL 104 05/24/2020 0846   CO2 25 05/24/2020 0846   BUN 11 05/24/2020 0846    CREATININE 0.79 05/24/2020 0846      Component Value Date/Time   CALCIUM 11.3 (H) 05/24/2020 0846   ALKPHOS 87 05/24/2020 0846   AST 16 05/24/2020 0846   ALT 20 05/24/2020 0846   BILITOT 0.5 05/24/2020 0846       No results found for: LABCA2  No components found for: BULAGT364  No results for input(s): INR in the last 168 hours.  No results found for: LABCA2  No results found for: WOE321  No results found for: YYQ825  No results found for: OIB704  No results found for: CA2729  No components found for: HGQUANT  No results found for: CEA1 / No results found for: CEA1   No results found for: AFPTUMOR  No results found for: CHROMOGRNA  No results found for: HGBA, HGBA2QUANT, HGBFQUANT, HGBSQUAN (Hemoglobinopathy evaluation)   No results found for: LDH  No results found for: IRON, TIBC, IRONPCTSAT (Iron and TIBC)  No results found for: FERRITIN  Urinalysis No results found for: COLORURINE, APPEARANCEUR, LABSPEC, PHURINE, GLUCOSEU, HGBUR, BILIRUBINUR, KETONESUR, PROTEINUR, UROBILINOGEN, NITRITE, LEUKOCYTESUR   STUDIES: No results found.   ELIGIBLE FOR AVAILABLE RESEARCH PROTOCOL: no  ASSESSMENT: 74 y.o. [place] woman status post left breast upper inner quadrant biopsy 01/15/2020 for a clinical T2 NX, stage IIB anaplastic carcinoma, triple negative, with an MIB-1 of 12%  (a) bone scan and CT chest 02/12/2020 show no evidence of metastatic disease  (1) neoadjuvant chemotherapy with doxorubicin and cyclophosphamide in dose dense fashion x4 started 02/23/2020, followed by weekly paclitaxel and carboplatin x12 starting 04/19/2020  (a) echo 02/12/2020 shows an ejection fraction in the 60-65% range  (2) definitive surgery to follow  (3) adjuvant radiation as appropriate   PLAN: Tonique is doing generally well with her chemotherapy, but her [place] has been steadily dropping.  I think if we try to treat her next week we would not be able to because  her counts would be too low.  It makes more sense to give her a week off and try to resume treatment on 06/07/2020.  That is what we are doing.  I am going to get  a urinalysis and urine culture today.  If it is not completely normal I will put her on antibiotic for the next 5 days.  The plan then is to try to complete a total of 12 treatments.  I hope we can do this with some interruptions but without needing growth factor support although that is an option if necessary  She will see one of our APP is on 06/07/2020.  She will see me again in January 11.  I encouraged him to call with any problem that may develop before the next visit  Total encounter time 25 minutes.Chauncey Cruel, MD   05/24/2020 9:34 AM Medical Oncology and Hematology [facility] [place], [place] [address] Tel. [phone]    Fax. [phone]   I, Wilburn Mylar, am acting as scribe for Dr. [name]. Fernando Torry.  I, Lurline Del MD, have reviewed the above documentation for accuracy and completeness, and I agree with the above.   *Total Encounter Time as defined by the Centers for Medicare and Medicaid Services includes, in addition to the face-to-face time of a patient visit (documented in the note above) non-face-to-face time: obtaining and reviewing outside history, ordering and reviewing medications, tests or procedures, care coordination (communications with other health care professionals or caregivers) and documentation in the medical record.

## 2020-05-24 ENCOUNTER — Inpatient Hospital Stay: Payer: Medicare PPO

## 2020-05-24 ENCOUNTER — Encounter: Payer: Self-pay | Admitting: *Deleted

## 2020-05-24 ENCOUNTER — Inpatient Hospital Stay (HOSPITAL_BASED_OUTPATIENT_CLINIC_OR_DEPARTMENT_OTHER): Payer: Medicare PPO | Admitting: Oncology

## 2020-05-24 ENCOUNTER — Telehealth: Payer: Self-pay | Admitting: *Deleted

## 2020-05-24 ENCOUNTER — Other Ambulatory Visit: Payer: Self-pay

## 2020-05-24 ENCOUNTER — Other Ambulatory Visit: Payer: Self-pay | Admitting: Oncology

## 2020-05-24 VITALS — BP 158/90 | HR 107 | Temp 97.8°F | Resp 18 | Ht 63.0 in | Wt 149.9 lb

## 2020-05-24 VITALS — HR 107

## 2020-05-24 DIAGNOSIS — Z95828 Presence of other vascular implants and grafts: Secondary | ICD-10-CM

## 2020-05-24 DIAGNOSIS — Z5111 Encounter for antineoplastic chemotherapy: Secondary | ICD-10-CM | POA: Diagnosis not present

## 2020-05-24 DIAGNOSIS — C50919 Malignant neoplasm of unspecified site of unspecified female breast: Secondary | ICD-10-CM | POA: Diagnosis not present

## 2020-05-24 DIAGNOSIS — Z171 Estrogen receptor negative status [ER-]: Secondary | ICD-10-CM

## 2020-05-24 DIAGNOSIS — C50212 Malignant neoplasm of upper-inner quadrant of left female breast: Secondary | ICD-10-CM

## 2020-05-24 LAB — CMP (CANCER CENTER ONLY)
ALT: 20 U/L (ref 0–44)
AST: 16 U/L (ref 15–41)
Albumin: 3.5 g/dL (ref 3.5–5.0)
Alkaline Phosphatase: 87 U/L (ref 38–126)
Anion gap: 9 (ref 5–15)
BUN: 11 mg/dL (ref 8–23)
CO2: 25 mmol/L (ref 22–32)
Calcium: 11.3 mg/dL — ABNORMAL HIGH (ref 8.9–10.3)
Chloride: 104 mmol/L (ref 98–111)
Creatinine: 0.79 mg/dL (ref 0.44–1.00)
GFR, Estimated: >60 mL/min (ref >60)
Glucose, Bld: 155 mg/dL — ABNORMAL HIGH (ref 70–99)
Potassium: 3.2 mmol/L — ABNORMAL LOW (ref 3.5–5.1)
Sodium: 138 mmol/L (ref 135–145)
Total Bilirubin: 0.5 mg/dL (ref 0.3–1.2)
Total Protein: 6.6 g/dL (ref 6.5–8.1)

## 2020-05-24 LAB — URINALYSIS, COMPLETE (UACMP) WITH MICROSCOPIC
Bilirubin Urine: NEGATIVE
Glucose, UA: NEGATIVE mg/dL
Hgb urine dipstick: NEGATIVE
Ketones, ur: NEGATIVE mg/dL
Nitrite: NEGATIVE
Protein, ur: 100 mg/dL — AB
Specific Gravity, Urine: 1.015 (ref 1.005–1.030)
pH: 6 (ref 5.0–8.0)

## 2020-05-24 LAB — CBC WITH DIFFERENTIAL (CANCER CENTER ONLY)
Abs Immature Granulocytes: 0.01 10*3/uL (ref 0.00–0.07)
Basophils Absolute: 0 10*3/uL (ref 0.0–0.1)
Basophils Relative: 1 %
Eosinophils Absolute: 0 10*3/uL (ref 0.0–0.5)
Eosinophils Relative: 0 %
HCT: 34.5 % — ABNORMAL LOW (ref 36.0–46.0)
Hemoglobin: 11.8 g/dL — ABNORMAL LOW (ref 12.0–15.0)
Immature Granulocytes: 0 %
Lymphocytes Relative: 41 %
Lymphs Abs: 0.9 10*3/uL (ref 0.7–4.0)
MCH: 32 pg (ref 26.0–34.0)
MCHC: 34.2 g/dL (ref 30.0–36.0)
MCV: 93.5 fL (ref 80.0–100.0)
Monocytes Absolute: 0.2 10*3/uL (ref 0.1–1.0)
Monocytes Relative: 9 %
Neutro Abs: 1.1 10*3/uL — ABNORMAL LOW (ref 1.7–7.7)
Neutrophils Relative %: 49 %
Platelet Count: 288 10*3/uL (ref 150–400)
RBC: 3.69 MIL/uL — ABNORMAL LOW (ref 3.87–5.11)
RDW: 16.1 % — ABNORMAL HIGH (ref 11.5–15.5)
WBC Count: 2.3 10*3/uL — ABNORMAL LOW (ref 4.0–10.5)
nRBC: 0 % (ref 0.0–0.2)

## 2020-05-24 MED ORDER — HEPARIN SOD (PORK) LOCK FLUSH 100 UNIT/ML IV SOLN
500.0000 [IU] | Freq: Once | INTRAVENOUS | Status: AC | PRN
  Administered 2020-05-24: 500 [IU]
  Filled 2020-05-24: qty 5

## 2020-05-24 MED ORDER — PALONOSETRON HCL INJECTION 0.25 MG/5ML
0.2500 mg | Freq: Once | INTRAVENOUS | Status: AC
  Administered 2020-05-24: 0.25 mg via INTRAVENOUS

## 2020-05-24 MED ORDER — FAMOTIDINE IN NACL 20-0.9 MG/50ML-% IV SOLN
20.0000 mg | Freq: Once | INTRAVENOUS | Status: AC
  Administered 2020-05-24: 20 mg via INTRAVENOUS

## 2020-05-24 MED ORDER — FAMOTIDINE IN NACL 20-0.9 MG/50ML-% IV SOLN
INTRAVENOUS | Status: AC
  Filled 2020-05-24: qty 50

## 2020-05-24 MED ORDER — SODIUM CHLORIDE 0.9 % IV SOLN
Freq: Once | INTRAVENOUS | Status: AC
  Filled 2020-05-24: qty 250

## 2020-05-24 MED ORDER — SODIUM CHLORIDE 0.9 % IV SOLN
80.0000 mg/m2 | Freq: Once | INTRAVENOUS | Status: AC
  Administered 2020-05-24: 144 mg via INTRAVENOUS
  Filled 2020-05-24: qty 24

## 2020-05-24 MED ORDER — SODIUM CHLORIDE 0.9 % IV SOLN
164.4000 mg | Freq: Once | INTRAVENOUS | Status: AC
  Administered 2020-05-24: 160 mg via INTRAVENOUS
  Filled 2020-05-24: qty 16

## 2020-05-24 MED ORDER — PALONOSETRON HCL INJECTION 0.25 MG/5ML
INTRAVENOUS | Status: AC
  Filled 2020-05-24: qty 5

## 2020-05-24 MED ORDER — DIPHENHYDRAMINE HCL 50 MG/ML IJ SOLN
25.0000 mg | Freq: Once | INTRAMUSCULAR | Status: AC
  Administered 2020-05-24: 25 mg via INTRAVENOUS

## 2020-05-24 MED ORDER — SODIUM CHLORIDE 0.9% FLUSH
10.0000 mL | INTRAVENOUS | Status: DC | PRN
  Administered 2020-05-24: 10 mL
  Filled 2020-05-24: qty 10

## 2020-05-24 MED ORDER — DEXAMETHASONE SODIUM PHOSPHATE 100 MG/10ML IJ SOLN
10.0000 mg | Freq: Once | INTRAMUSCULAR | Status: AC
  Administered 2020-05-24: 10 mg via INTRAVENOUS
  Filled 2020-05-24: qty 10

## 2020-05-24 MED ORDER — DIPHENHYDRAMINE HCL 50 MG/ML IJ SOLN
INTRAMUSCULAR | Status: AC
  Filled 2020-05-24: qty 1

## 2020-05-24 MED ORDER — CIPROFLOXACIN HCL 500 MG PO TABS: 500.0000 mg | ORAL_TABLET | Freq: Two times a day (BID) | ORAL | 0 refills | Status: DC

## 2020-05-24 NOTE — Telephone Encounter (Signed)
OK to treat with ANC of 1.1 per MD.

## 2020-05-24 NOTE — Patient Instructions (Signed)
[facility] Discharge Instructions for Patients Receiving Chemotherapy  Today you received the following chemotherapy agents Paclitaxel (TAXOL) & Carboplatin (Paraplatin).  To help prevent nausea and vomiting after your treatment, we encourage you to take your nausea medication as prescribed.   If you develop nausea and vomiting that is not controlled by your nausea medication, call the clinic.   BELOW ARE SYMPTOMS THAT SHOULD BE REPORTED IMMEDIATELY:  *FEVER GREATER THAN 100.5 F  *CHILLS WITH OR WITHOUT FEVER  NAUSEA AND VOMITING THAT IS NOT CONTROLLED WITH YOUR NAUSEA MEDICATION  *UNUSUAL SHORTNESS OF BREATH  *UNUSUAL BRUISING OR BLEEDING  TENDERNESS IN MOUTH AND THROAT WITH OR WITHOUT PRESENCE OF ULCERS  *URINARY PROBLEMS  *BOWEL PROBLEMS  UNUSUAL RASH Items with * indicate a potential emergency and should be followed up as soon as possible.  Feel free to call the clinic should you have any questions or concerns. The clinic phone number is [phone].  Please show the [place] at check-in to the Emergency Department and triage nurse.

## 2020-05-24 NOTE — Patient Instructions (Signed)

## 2020-05-24 NOTE — Progress Notes (Signed)
Per Dr. Jana Hakim: okay to treat with ANC of 1.1 and elevated HR of 107

## 2020-05-24 NOTE — Progress Notes (Signed)
Addendum: Novalynn has a UTI.  I called her Cipro to take twice daily for 5 days.  I discussed with her daughter.

## 2020-05-25 LAB — URINE CULTURE: Culture: <10000 — AB

## 2020-05-29 ENCOUNTER — Other Ambulatory Visit: Payer: Self-pay | Admitting: Oncology

## 2020-05-31 ENCOUNTER — Inpatient Hospital Stay: Payer: Medicare PPO

## 2020-06-06 ENCOUNTER — Ambulatory Visit: Payer: Medicare PPO

## 2020-06-06 ENCOUNTER — Ambulatory Visit: Payer: Medicare PPO | Admitting: Medical

## 2020-06-06 ENCOUNTER — Inpatient Hospital Stay: Payer: Medicare PPO

## 2020-06-06 ENCOUNTER — Other Ambulatory Visit: Payer: Medicare PPO

## 2020-06-06 ENCOUNTER — Inpatient Hospital Stay (HOSPITAL_BASED_OUTPATIENT_CLINIC_OR_DEPARTMENT_OTHER): Payer: Medicare PPO | Admitting: Medical

## 2020-06-06 ENCOUNTER — Other Ambulatory Visit: Payer: Self-pay

## 2020-06-06 VITALS — BP 163/91 | HR 107 | Temp 97.8°F | Resp 16 | Ht 63.0 in | Wt 156.8 lb

## 2020-06-06 VITALS — BP 122/90 | HR 96

## 2020-06-06 DIAGNOSIS — Z171 Estrogen receptor negative status [ER-]: Secondary | ICD-10-CM

## 2020-06-06 DIAGNOSIS — C50212 Malignant neoplasm of upper-inner quadrant of left female breast: Secondary | ICD-10-CM | POA: Diagnosis not present

## 2020-06-06 DIAGNOSIS — Z5111 Encounter for antineoplastic chemotherapy: Secondary | ICD-10-CM | POA: Diagnosis not present

## 2020-06-06 DIAGNOSIS — E876 Hypokalemia: Secondary | ICD-10-CM

## 2020-06-06 DIAGNOSIS — C50919 Malignant neoplasm of unspecified site of unspecified female breast: Secondary | ICD-10-CM

## 2020-06-06 DIAGNOSIS — I10 Essential (primary) hypertension: Secondary | ICD-10-CM | POA: Diagnosis not present

## 2020-06-06 LAB — CBC WITH DIFFERENTIAL (CANCER CENTER ONLY)
Abs Immature Granulocytes: 0.02 10*3/uL (ref 0.00–0.07)
Basophils Absolute: 0 10*3/uL (ref 0.0–0.1)
Basophils Relative: 0 %
Eosinophils Absolute: 0 10*3/uL (ref 0.0–0.5)
Eosinophils Relative: 0 %
HCT: 36 % (ref 36.0–46.0)
Hemoglobin: 11.8 g/dL — ABNORMAL LOW (ref 12.0–15.0)
Immature Granulocytes: 1 %
Lymphocytes Relative: 30 %
Lymphs Abs: 1 10*3/uL (ref 0.7–4.0)
MCH: 31.4 pg (ref 26.0–34.0)
MCHC: 32.8 g/dL (ref 30.0–36.0)
MCV: 95.7 fL (ref 80.0–100.0)
Monocytes Absolute: 0.5 10*3/uL (ref 0.1–1.0)
Monocytes Relative: 16 %
Neutro Abs: 1.7 10*3/uL (ref 1.7–7.7)
Neutrophils Relative %: 53 %
Platelet Count: 390 10*3/uL (ref 150–400)
RBC: 3.76 MIL/uL — ABNORMAL LOW (ref 3.87–5.11)
RDW: 15.3 % (ref 11.5–15.5)
WBC Count: 3.3 10*3/uL — ABNORMAL LOW (ref 4.0–10.5)
nRBC: 0 % (ref 0.0–0.2)

## 2020-06-06 LAB — CMP (CANCER CENTER ONLY)
ALT: 18 U/L (ref 0–44)
AST: 16 U/L (ref 15–41)
Albumin: 3.5 g/dL (ref 3.5–5.0)
Alkaline Phosphatase: 104 U/L (ref 38–126)
Anion gap: 7 (ref 5–15)
BUN: 8 mg/dL (ref 8–23)
CO2: 27 mmol/L (ref 22–32)
Calcium: 10.7 mg/dL — ABNORMAL HIGH (ref 8.9–10.3)
Chloride: 105 mmol/L (ref 98–111)
Creatinine: 0.76 mg/dL (ref 0.44–1.00)
GFR, Estimated: >60 mL/min (ref >60)
Glucose, Bld: 116 mg/dL — ABNORMAL HIGH (ref 70–99)
Potassium: 3.3 mmol/L — ABNORMAL LOW (ref 3.5–5.1)
Sodium: 139 mmol/L (ref 135–145)
Total Bilirubin: 0.3 mg/dL (ref 0.3–1.2)
Total Protein: 6.6 g/dL (ref 6.5–8.1)

## 2020-06-06 MED ORDER — DIPHENHYDRAMINE HCL 50 MG/ML IJ SOLN
25.0000 mg | Freq: Once | INTRAMUSCULAR | Status: AC
  Administered 2020-06-06: 25 mg via INTRAVENOUS

## 2020-06-06 MED ORDER — HEPARIN SOD (PORK) LOCK FLUSH 100 UNIT/ML IV SOLN
500.0000 [IU] | Freq: Once | INTRAVENOUS | Status: AC | PRN
  Administered 2020-06-06: 500 [IU]
  Filled 2020-06-06: qty 5

## 2020-06-06 MED ORDER — PALONOSETRON HCL INJECTION 0.25 MG/5ML
0.2500 mg | Freq: Once | INTRAVENOUS | Status: AC
  Administered 2020-06-06: 0.25 mg via INTRAVENOUS

## 2020-06-06 MED ORDER — FAMOTIDINE IN NACL 20-0.9 MG/50ML-% IV SOLN
20.0000 mg | Freq: Once | INTRAVENOUS | Status: AC
  Administered 2020-06-06: 20 mg via INTRAVENOUS

## 2020-06-06 MED ORDER — SODIUM CHLORIDE 0.9 % IV SOLN
80.0000 mg/m2 | Freq: Once | INTRAVENOUS | Status: AC
  Administered 2020-06-06: 144 mg via INTRAVENOUS
  Filled 2020-06-06: qty 24

## 2020-06-06 MED ORDER — FAMOTIDINE IN NACL 20-0.9 MG/50ML-% IV SOLN
INTRAVENOUS | Status: AC
  Filled 2020-06-06: qty 50

## 2020-06-06 MED ORDER — DIPHENHYDRAMINE HCL 50 MG/ML IJ SOLN
INTRAMUSCULAR | Status: AC
  Filled 2020-06-06: qty 1

## 2020-06-06 MED ORDER — SODIUM CHLORIDE 0.9 % IV SOLN
10.0000 mg | Freq: Once | INTRAVENOUS | Status: AC
  Administered 2020-06-06: 10 mg via INTRAVENOUS
  Filled 2020-06-06: qty 10

## 2020-06-06 MED ORDER — PALONOSETRON HCL INJECTION 0.25 MG/5ML
INTRAVENOUS | Status: AC
  Filled 2020-06-06: qty 5

## 2020-06-06 MED ORDER — SODIUM CHLORIDE 0.9 % IV SOLN
164.4000 mg | Freq: Once | INTRAVENOUS | Status: AC
  Administered 2020-06-06: 160 mg via INTRAVENOUS
  Filled 2020-06-06: qty 16

## 2020-06-06 MED ORDER — SODIUM CHLORIDE 0.9% FLUSH
10.0000 mL | INTRAVENOUS | Status: DC | PRN
  Administered 2020-06-06: 10 mL
  Filled 2020-06-06: qty 10

## 2020-06-06 MED ORDER — SODIUM CHLORIDE 0.9 % IV SOLN
Freq: Once | INTRAVENOUS | Status: AC
  Filled 2020-06-06: qty 250

## 2020-06-06 NOTE — Patient Instructions (Signed)
[facility] Discharge Instructions for Patients Receiving Chemotherapy  Today you received the following chemotherapy agents Paclitaxel (TAXOL) & Carboplatin (Paraplatin).  To help prevent nausea and vomiting after your treatment, we encourage you to take your nausea medication as prescribed.   If you develop nausea and vomiting that is not controlled by your nausea medication, call the clinic.   BELOW ARE SYMPTOMS THAT SHOULD BE REPORTED IMMEDIATELY:  *FEVER GREATER THAN 100.5 F  *CHILLS WITH OR WITHOUT FEVER  NAUSEA AND VOMITING THAT IS NOT CONTROLLED WITH YOUR NAUSEA MEDICATION  *UNUSUAL SHORTNESS OF BREATH  *UNUSUAL BRUISING OR BLEEDING  TENDERNESS IN MOUTH AND THROAT WITH OR WITHOUT PRESENCE OF ULCERS  *URINARY PROBLEMS  *BOWEL PROBLEMS  UNUSUAL RASH Items with * indicate a potential emergency and should be followed up as soon as possible.  Feel free to call the clinic should you have any questions or concerns. The clinic phone number is [phone].  Please show the CHEMO ALERT CARD at check-in to the Emergency Department and triage nurse.   

## 2020-06-06 NOTE — Progress Notes (Signed)
Duplicate note

## 2020-06-06 NOTE — Patient Instructions (Signed)

## 2020-06-06 NOTE — Progress Notes (Signed)
[facility] Clinic Progress Note   Donna [name] [id number] 09-15-1945 74 y.o.  Donna [name] is managed by Dr. Lurline Del  Actively treated with chemotherapy/immunotherapy/hormonal therapy: yes  Current therapy: Carboplatin and paclitaxel  Last treated: 03/24/2020 (cycle 10, day 1)  Next scheduled appointment with provider: 06/20/2020  Assessment: Plan:    Malignant neoplasm of upper-inner quadrant of left breast in female, estrogen receptor negative ([place])  Hypokalemia   ER negative malignant neoplasm of the left breast: The patient presents to the clinic today for cycle 11, day 1 of carboplatin and paclitaxel.  She will return in 1 week for treatment only and then will be seen in follow-up on 06/20/2020.  Hypokalemia: The patient's labs returned today with a potassium of 3.3.  This is slightly better than her last visit.  She continues on potassium chloride 10 mEq twice daily.  Hypertension: The patient's initial blood pressure this morning returned at 163/91.  This was rechecked and returned at 142/97.  She reports that she did not take her blood pressure medicines this morning prior to her visit.  Please see After Visit Summary for patient specific instructions.  Future Appointments  Date Time Provider [place]  06/14/2020  8:30 AM [facility] LAB [facility] None  06/14/2020  8:45 AM [facility] FLUSH [facility] None  06/14/2020  9:45 AM [facility] INFUSION [facility] None  06/20/2020  8:45 AM [facility] LAB [facility] None  06/20/2020  9:00 AM [facility] [place] None  06/20/2020  9:30 AM Magrinat, [name], MD [facility] None  06/20/2020 10:00 AM [facility] INFUSION [facility] None  06/28/2020  8:45 AM [facility] LAB [facility] None  06/28/2020  9:00 AM [facility] [place] FLUSH [facility] None  06/28/2020  9:45 AM [facility] INFUSION [facility] None  07/05/2020  8:45 AM [facility] LAB [facility] None  07/05/2020  9:00 AM [facility]  [place] FLUSH [facility] None  07/05/2020  9:45 AM [facility] INFUSION [facility] None  07/12/2020  8:45 AM [facility] LAB [facility] None  07/12/2020  9:00 AM [facility] [place] FLUSH [facility] None  07/12/2020  9:45 AM [facility] INFUSION [facility] None    No orders of the defined types were placed in this encounter.      Subjective:   Patient ID:  Donna [name] is a 74 y.o. (DOB [birth year]) female.  Chief Complaint:  Chief Complaint  Patient presents with  . Follow-up    HPI Donna [name] is a 74 y.o. female with a diagnosis of an ER negative malignant neoplasm of the left breast.  She continues to be followed by Dr. Jana Hakim and presents to the clinic today for cycle 11, day 1 of carboplatin and paclitaxel.  She is seen with her son today.  She reports that she did not take her blood pressure medications this morning.  She reports that she is doing well otherwise.  She is tolerating her chemotherapy well with no issues of concern.  She denies fevers, chills, sweats, nausea, vomiting, constipation, or diarrhea.  Medications: I have reviewed the patient's current medications.  Allergies:  Allergies  Allergen Reactions  . Benazepril Swelling    Patient had severe angioedema on 10/05/2015.    Past Medical History:  Diagnosis Date  . Arthritis    oa  . History of kidney stones   . Hypertension     Past Surgical History:  Procedure Laterality Date  . CYSTOSCOPY  yrs ago  . IR IMAGING GUIDED PORT INSERTION  02/16/2020  . TOTAL KNEE ARTHROPLASTY Left 06/05/2016   Procedure: LEFT TOTAL KNEE  ARTHROPLASTY;  Surgeon: Durene Romans, MD;  Location: [facility];  Service: Orthopedics;  Laterality: Left;    No family history on file.  Social History   Socioeconomic History  . Marital status: Divorced    Spouse name: Not on file  . Number of children: Not on file  . Years of education: Not on file  . Highest education level: Not on file  Occupational History  . Not on file   Tobacco Use  . Smoking status: Never Smoker  . Smokeless tobacco: Never Used  Substance and Sexual Activity  . Alcohol use: No  . Drug use: No  . Sexual activity: Not on file  Other Topics Concern  . Not on file  Social History Narrative  . Not on file   Social Determinants of Health   Financial Resource Strain: Not on file  Food Insecurity: Not on file  Transportation Needs: Not on file  Physical Activity: Not on file  Stress: Not on file  Social Connections: Not on file  Intimate Partner Violence: Not on file    Past Medical History, Surgical history, Social history, and Family history were reviewed and updated as appropriate.   Please see review of systems for further details on the patient's review from today.   Review of Systems:  Review of Systems  Constitutional: Negative for chills, diaphoresis and fever.  HENT: Negative for trouble swallowing and voice change.   Respiratory: Negative for cough, chest tightness, shortness of breath and wheezing.   Cardiovascular: Negative for chest pain and palpitations.  Gastrointestinal: Negative for abdominal pain, constipation, diarrhea, nausea and vomiting.  Musculoskeletal: Negative for back pain and myalgias.  Neurological: Negative for dizziness, light-headedness and headaches.    Objective:   Physical Exam:  BP (!) 163/91 (BP Location: Left Arm, Patient Position: Sitting) Comment: nurse notified  Pulse (!) 107   Temp 97.8 F (36.6 C) (Tympanic)   Resp 16   Ht 5\' 3"  (1.6 m)   Wt 156 lb 12.8 oz (71.1 kg)   SpO2 100%   BMI 27.78 kg/m  ECOG: 0  Physical Exam Constitutional:      General: She is not in acute distress.    Appearance: She is not diaphoretic.  HENT:     Head: Normocephalic and atraumatic.  Eyes:     General: No scleral icterus.       Right eye: No discharge.        Left eye: No discharge.  Cardiovascular:     Rate and Rhythm: Normal rate and regular rhythm.     Heart sounds: Normal heart  sounds. No murmur heard. No friction rub. No gallop.   Pulmonary:     Effort: Pulmonary effort is normal. No respiratory distress.     Breath sounds: Normal breath sounds. No wheezing or rales.  Musculoskeletal:     Right lower leg: No edema.     Left lower leg: No edema.  Skin:    General: Skin is warm and dry.     Findings: No erythema or rash.  Neurological:     Mental Status: She is alert.     Coordination: Coordination normal.     Gait: Gait normal.  Psychiatric:        Mood and Affect: Mood normal.        Behavior: Behavior normal.        Thought Content: Thought content normal.        Judgment: Judgment normal.     Lab Review:  Component Value Date/Time   NA 139 06/06/2020 0935   K 3.3 (L) 06/06/2020 0935   CL 105 06/06/2020 0935   CO2 27 06/06/2020 0935   GLUCOSE 116 (H) 06/06/2020 0935   BUN 8 06/06/2020 0935   CREATININE 0.76 06/06/2020 0935   CALCIUM 10.7 (H) 06/06/2020 0935   PROT 6.6 06/06/2020 0935   ALBUMIN 3.5 06/06/2020 0935   AST 16 06/06/2020 0935   ALT 18 06/06/2020 0935   ALKPHOS 104 06/06/2020 0935   BILITOT 0.3 06/06/2020 0935   GFRNONAA >60 06/06/2020 0935   GFRAA >60 03/08/2020 0906       Component Value Date/Time   WBC 3.3 (L) 06/06/2020 0935   WBC 17.0 (H) 06/06/2016 0442   RBC 3.76 (L) 06/06/2020 0935   HGB 11.8 (L) 06/06/2020 0935   HCT 36.0 06/06/2020 0935   PLT 390 06/06/2020 0935   MCV 95.7 06/06/2020 0935   MCH 31.4 06/06/2020 0935   MCHC 32.8 06/06/2020 0935   RDW 15.3 06/06/2020 0935   LYMPHSABS 1.0 06/06/2020 0935   MONOABS 0.5 06/06/2020 0935   EOSABS 0.0 06/06/2020 0935   BASOSABS 0.0 06/06/2020 0935   -------------------------------  Imaging from last 24 hours (if applicable):  Radiology interpretation: No results found.    OK to treat pending chemistry panel.  Sandi Mealy, MHS, PA-C Physician Assistant

## 2020-06-14 ENCOUNTER — Inpatient Hospital Stay: Payer: Medicare Other

## 2020-06-14 ENCOUNTER — Other Ambulatory Visit: Payer: Self-pay

## 2020-06-14 ENCOUNTER — Inpatient Hospital Stay: Payer: Medicare Other | Attending: Oncology

## 2020-06-14 VITALS — BP 131/84 | HR 98 | Temp 97.7°F | Resp 18

## 2020-06-14 DIAGNOSIS — Z171 Estrogen receptor negative status [ER-]: Secondary | ICD-10-CM

## 2020-06-14 DIAGNOSIS — Z7982 Long term (current) use of aspirin: Secondary | ICD-10-CM | POA: Insufficient documentation

## 2020-06-14 DIAGNOSIS — Z87442 Personal history of urinary calculi: Secondary | ICD-10-CM | POA: Diagnosis not present

## 2020-06-14 DIAGNOSIS — I10 Essential (primary) hypertension: Secondary | ICD-10-CM | POA: Insufficient documentation

## 2020-06-14 DIAGNOSIS — G629 Polyneuropathy, unspecified: Secondary | ICD-10-CM | POA: Insufficient documentation

## 2020-06-14 DIAGNOSIS — C50212 Malignant neoplasm of upper-inner quadrant of left female breast: Secondary | ICD-10-CM

## 2020-06-14 DIAGNOSIS — Z79899 Other long term (current) drug therapy: Secondary | ICD-10-CM | POA: Diagnosis not present

## 2020-06-14 DIAGNOSIS — Z95828 Presence of other vascular implants and grafts: Secondary | ICD-10-CM

## 2020-06-14 DIAGNOSIS — Z7952 Long term (current) use of systemic steroids: Secondary | ICD-10-CM | POA: Diagnosis not present

## 2020-06-14 DIAGNOSIS — Z5111 Encounter for antineoplastic chemotherapy: Secondary | ICD-10-CM | POA: Insufficient documentation

## 2020-06-14 DIAGNOSIS — C50919 Malignant neoplasm of unspecified site of unspecified female breast: Secondary | ICD-10-CM

## 2020-06-14 LAB — CBC WITH DIFFERENTIAL (CANCER CENTER ONLY)
Abs Immature Granulocytes: 0.02 10*3/uL (ref 0.00–0.07)
Basophils Absolute: 0 10*3/uL (ref 0.0–0.1)
Basophils Relative: 1 %
Eosinophils Absolute: 0 10*3/uL (ref 0.0–0.5)
Eosinophils Relative: 0 %
HCT: 35.2 % — ABNORMAL LOW (ref 36.0–46.0)
Hemoglobin: 11.7 g/dL — ABNORMAL LOW (ref 12.0–15.0)
Immature Granulocytes: 1 %
Lymphocytes Relative: 38 %
Lymphs Abs: 1.1 10*3/uL (ref 0.7–4.0)
MCH: 31.6 pg (ref 26.0–34.0)
MCHC: 33.2 g/dL (ref 30.0–36.0)
MCV: 95.1 fL (ref 80.0–100.0)
Monocytes Absolute: 0.3 10*3/uL (ref 0.1–1.0)
Monocytes Relative: 10 %
Neutro Abs: 1.5 10*3/uL — ABNORMAL LOW (ref 1.7–7.7)
Neutrophils Relative %: 50 %
Platelet Count: 340 10*3/uL (ref 150–400)
RBC: 3.7 MIL/uL — ABNORMAL LOW (ref 3.87–5.11)
RDW: 14.5 % (ref 11.5–15.5)
WBC Count: 3 10*3/uL — ABNORMAL LOW (ref 4.0–10.5)
nRBC: 0 % (ref 0.0–0.2)

## 2020-06-14 LAB — CMP (CANCER CENTER ONLY)
ALT: 14 U/L (ref 0–44)
AST: 14 U/L — ABNORMAL LOW (ref 15–41)
Albumin: 3.5 g/dL (ref 3.5–5.0)
Alkaline Phosphatase: 108 U/L (ref 38–126)
Anion gap: 5 (ref 5–15)
BUN: 9 mg/dL (ref 8–23)
CO2: 27 mmol/L (ref 22–32)
Calcium: 10.8 mg/dL — ABNORMAL HIGH (ref 8.9–10.3)
Chloride: 104 mmol/L (ref 98–111)
Creatinine: 0.8 mg/dL (ref 0.44–1.00)
GFR, Estimated: >60 mL/min (ref >60)
Glucose, Bld: 159 mg/dL — ABNORMAL HIGH (ref 70–99)
Potassium: 3.4 mmol/L — ABNORMAL LOW (ref 3.5–5.1)
Sodium: 136 mmol/L (ref 135–145)
Total Bilirubin: 0.5 mg/dL (ref 0.3–1.2)
Total Protein: 6.7 g/dL (ref 6.5–8.1)

## 2020-06-14 MED ORDER — PALONOSETRON HCL INJECTION 0.25 MG/5ML
0.2500 mg | Freq: Once | INTRAVENOUS | Status: AC
  Administered 2020-06-14: 0.25 mg via INTRAVENOUS

## 2020-06-14 MED ORDER — SODIUM CHLORIDE 0.9 % IV SOLN
10.0000 mg | Freq: Once | INTRAVENOUS | Status: AC
  Administered 2020-06-14: 10 mg via INTRAVENOUS
  Filled 2020-06-14: qty 1
  Filled 2020-06-14: qty 10

## 2020-06-14 MED ORDER — SODIUM CHLORIDE 0.9 % IV SOLN
80.0000 mg/m2 | Freq: Once | INTRAVENOUS | Status: AC
  Administered 2020-06-14: 144 mg via INTRAVENOUS
  Filled 2020-06-14: qty 24

## 2020-06-14 MED ORDER — SODIUM CHLORIDE 0.9% FLUSH
10.0000 mL | INTRAVENOUS | Status: DC | PRN
  Administered 2020-06-14: 10 mL
  Filled 2020-06-14: qty 10

## 2020-06-14 MED ORDER — SODIUM CHLORIDE 0.9% FLUSH
10.0000 mL | INTRAVENOUS | Status: DC | PRN
Start: 2020-06-14 — End: 2020-06-14
  Administered 2020-06-14: 10 mL
  Filled 2020-06-14: qty 10

## 2020-06-14 MED ORDER — CARBOPLATIN CHEMO INJECTION 450 MG/45ML
164.4000 mg | Freq: Once | INTRAVENOUS | Status: AC
  Administered 2020-06-14: 160 mg via INTRAVENOUS
  Filled 2020-06-14: qty 16

## 2020-06-14 MED ORDER — DIPHENHYDRAMINE HCL 50 MG/ML IJ SOLN
25.0000 mg | Freq: Once | INTRAMUSCULAR | Status: AC
  Administered 2020-06-14: 25 mg via INTRAVENOUS

## 2020-06-14 MED ORDER — SODIUM CHLORIDE 0.9 % IV SOLN
Freq: Once | INTRAVENOUS | Status: AC
  Filled 2020-06-14: qty 250

## 2020-06-14 MED ORDER — FAMOTIDINE IN NACL 20-0.9 MG/50ML-% IV SOLN
20.0000 mg | Freq: Once | INTRAVENOUS | Status: AC
  Administered 2020-06-14: 20 mg via INTRAVENOUS

## 2020-06-14 MED ORDER — DIPHENHYDRAMINE HCL 50 MG/ML IJ SOLN
INTRAMUSCULAR | Status: AC
  Filled 2020-06-14: qty 1

## 2020-06-14 MED ORDER — HEPARIN SOD (PORK) LOCK FLUSH 100 UNIT/ML IV SOLN
500.0000 [IU] | Freq: Once | INTRAVENOUS | Status: AC | PRN
  Administered 2020-06-14: 500 [IU]
  Filled 2020-06-14: qty 5

## 2020-06-14 MED ORDER — FAMOTIDINE IN NACL 20-0.9 MG/50ML-% IV SOLN
INTRAVENOUS | Status: AC
  Filled 2020-06-14: qty 50

## 2020-06-14 MED ORDER — PALONOSETRON HCL INJECTION 0.25 MG/5ML
INTRAVENOUS | Status: AC
  Filled 2020-06-14: qty 5

## 2020-06-14 NOTE — Patient Instructions (Signed)
[facility] Discharge Instructions for Patients Receiving Chemotherapy  Today you received the following chemotherapy agents carboplatin, paclitaxel.  To help prevent nausea and vomiting after your treatment, we encourage you to take your nausea medication as directed.    If you develop nausea and vomiting that is not controlled by your nausea medication, call the clinic.   BELOW ARE SYMPTOMS THAT SHOULD BE REPORTED IMMEDIATELY:  *FEVER GREATER THAN 100.5 F  *CHILLS WITH OR WITHOUT FEVER  NAUSEA AND VOMITING THAT IS NOT CONTROLLED WITH YOUR NAUSEA MEDICATION  *UNUSUAL SHORTNESS OF BREATH  *UNUSUAL BRUISING OR BLEEDING  TENDERNESS IN MOUTH AND THROAT WITH OR WITHOUT PRESENCE OF ULCERS  *URINARY PROBLEMS  *BOWEL PROBLEMS  UNUSUAL RASH Items with * indicate a potential emergency and should be followed up as soon as possible.  Feel free to call the clinic should you have any questions or concerns. The clinic phone number is [phone].  Please show the CHEMO ALERT CARD at check-in to the Emergency Department and triage nurse.   

## 2020-06-19 NOTE — Progress Notes (Signed)
[name]  Telephone:([phone] Fax:(336) [phone]     ID: Donna [name] DOB: 10/07/1945  MR#: [id number]  XEN#:[id number]  Patient Care Team: Montez Morita, PA-C as PCP - General (Hematology and Oncology) Insiya Oshea, [name], MD as Consulting Physician (Oncology) [name], MD as Consulting Physician (General Surgery) [name], MD as Consulting Physician (Orthopedic Surgery) [name], RN as Oncology Nurse Navigator [name], RN as Oncology Nurse Navigator Chauncey Cruel, MD OTHER MD: [name] MD ([facility]); [name] NP   CHIEF COMPLAINT: Triple negative breast cancer  CURRENT TREATMENT: neoadjuvant chemotherapy   INTERVAL HISTORY: Donna [name] returns today for follow up and treatment of her triple negative breast cancer accompanied by her son frederick.  She completed 4 cycles of Cytoxan and Adriamycin and began weekly carboplatin and paclitaxel 04/19/2020. Today is week 9 out of 12 planned.    REVIEW OF SYSTEMS: Elliott is tolerating treatment generally well.  She has some bad days when she feels a little more tired but she still makes herself do everything that she needs to do which is mostly housework.  She still has altered taste as her main symptom.  She developed a little bit of discomfort in her right thumb around the tip.  This is neither on the pad nor at the nailbed.  It is not symmetrical.  She has no evidence of peripheral neuropathy by close questioning today.  Detailed review of systems was otherwise stable   COVID 19 VACCINATION STATUS: s/p Moderna x2, with booster pending   HISTORY OF CURRENT ILLNESS: From the original intake note:  Donna [name] herself palpated a mass in her left breast during showering.  She brought this to medical attention and on 12/16/2019 underwent mammography, the results of which I do not have today.  She proceeded to left breast ultrasound showing breast density B.  There was a mass in  the upper inner quadrant of the left breast at the 10 o'clock position 7 cm from the nipple.  It measured 3.26 cm.  There are no ultrasound comments regarding the axilla.  Biopsy of the mass in question was performed 01/15/2020 at [facility] in [facility] and the pathology report describes a biphasic tumor with malignant epithelial component and a mesenchymal component.  The epithelial component appears to squamoid and stain strongly for CK 8/18 by immunohistochemistry.  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  The tumor was essentially triple negative, estrogen receptor 0%, progesterone receptor 1% with 1+ intensity, HER-2 1+ by immunohistochemistry, Ki-67 12%.  (The 1% progesterone receptor positivity is best read as an artifact as the progesterone receptor does not function in the absence of a functional estrogen receptor).  The patient's subsequent history is as detailed below   PAST MEDICAL HISTORY: Past Medical History:  Diagnosis Date  . Arthritis    oa  . History of kidney stones   . Hypertension     PAST SURGICAL HISTORY: Past Surgical History:  Procedure Laterality Date  . CYSTOSCOPY  yrs ago  . IR IMAGING GUIDED PORT INSERTION  02/16/2020  . TOTAL KNEE ARTHROPLASTY Left 06/05/2016   Procedure: LEFT TOTAL KNEE ARTHROPLASTY;  Surgeon: [name], MD;  Location: [facility];  Service: Orthopedics;  Laterality: Left;    FAMILY HISTORY No family history on file.  The patient has no information on her father.  Her mother died at the age of 26.  She  had 1 sister and 2 brothers.  No one on that side of the family is known to have had cancer.  The patient herself had 5/2 siblings, 2 sisters and 3 brothers, none with a history of cancer.   GYNECOLOGIC HISTORY:  No LMP recorded. Patient is postmenopausal. Menarche: 75 years old Age at first live birth: 75 years old [place] P 2 LMP mid  54s Contraceptive no HRT no  Hysterectomy?  No Salpingo-oophorectomy?  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  She is divorced and her former husband subsequently died.  She lives by herself with no pets.  Her son Donna [name] 68 is a Production manager and travels a great deal as a Optometrist.  Daughter Donna [name] teaches middle school in [place].  The patient has 4 grandchildren, no great-grandchildren.  She is a Psychologist, forensic.    ADVANCED DIRECTIVES: Not in place   HEALTH MAINTENANCE: Social History   Tobacco Use  . Smoking status: Never Smoker  . Smokeless tobacco: Never Used  Substance Use Topics  . Alcohol use: No  . Drug use: No     Colonoscopy: Never  PAP: Remote  Bone density: Remote   Allergies  Allergen Reactions  . Benazepril Swelling    Patient had severe angioedema on 10/05/2015.    Current Outpatient Medications  Medication Sig Dispense Refill  . amLODipine (NORVASC) 10 MG tablet Take 10 mg by mouth daily after lunch.     Marland Kitchen aspirin 81 MG chewable tablet Chew 1 tablet (81 mg total) by mouth 2 (two) times daily. Take for 4 weeks. 60 tablet 0  . chlorthalidone (HYGROTON) 25 MG tablet Take 25 mg by mouth daily after lunch.     . Cholecalciferol (VITAMIN D) 2000 units CAPS Take 2,000 Units by mouth daily after lunch.    . ciprofloxacin (CIPRO) 500 MG tablet Take 1 tablet (500 mg total) by mouth 2 (two) times daily. 10 tablet 0  . dexamethasone (DECADRON) 4 MG tablet Take 2 tablets by mouth daily starting the day after Carboplatin and Cytoxan x 3 days. Take with food. 30 tablet 1  . lidocaine-prilocaine (EMLA) cream Apply to affected area once 30 g 3  . loratadine (CLARITIN) 10 MG tablet TAKE 1 TABLET BY MOUTH DAILY. STARTING THE DAY AFTER CHEMOTHERAPY FOR 10 DAYS, THEN AS NEEDED 90 tablet 1  . losartan (COZAAR) 50 MG tablet Take 50 mg by mouth daily after lunch.    . Multiple Vitamins-Minerals (CENTRUM ADULTS) TABS 1  tab(s)    . potassium chloride (MICRO-K) 10 MEQ CR capsule Take 10 mEq by mouth 2 (two) times daily.    . prochlorperazine (COMPAZINE) 10 MG tablet Take 1 tablet (10 mg total) by mouth every 6 (six) hours as needed (Nausea or vomiting). 30 tablet 1   No current facility-administered medications for this visit.    OBJECTIVE: African-American woman in no acute distress Vitals:   06/20/20 0902  BP: 122/76  Pulse: (!) 108  Resp: 18  Temp: 97.6 F (36.4 C)  SpO2: 100%     Body mass index is 26.38 kg/m.   Wt Readings from Last 3 Encounters:  06/20/20 148 lb 14.4 oz (67.5 kg)  06/06/20 156 lb 12.8 oz (71.1 kg)  05/24/20 149 lb 14.4 oz (68 kg)      ECOG FS:1 - Symptomatic but completely ambulatory  Sclerae unicteric, EOMs intact Wearing a mask No cervical or supraclavicular adenopathy Lungs no  rales or rhonchi Heart regular rate and rhythm Abd soft, nontender, positive bowel sounds MSK no focal spinal tenderness, no upper extremity lymphedema Neuro: nonfocal, well oriented, appropriate affect Breasts: In the lower inner quadrant of the left breast the mass is still palpable, measuring approximately 3 cm, movable, without skin involvement.   LAB RESULTS:  CMP     Component Value Date/Time   NA 136 06/14/2020 0914   K 3.4 (L) 06/14/2020 0914   CL 104 06/14/2020 0914   CO2 27 06/14/2020 0914   GLUCOSE 159 (H) 06/14/2020 0914   BUN 9 06/14/2020 0914   CREATININE 0.80 06/14/2020 0914   CALCIUM 10.8 (H) 06/14/2020 0914   PROT 6.7 06/14/2020 0914   ALBUMIN 3.5 06/14/2020 0914   AST 14 (L) 06/14/2020 0914   ALT 14 06/14/2020 0914   ALKPHOS 108 06/14/2020 0914   BILITOT 0.5 06/14/2020 0914   GFRNONAA >60 06/14/2020 0914   GFRAA >60 03/08/2020 0906    No results found for: TOTALPROTELP, ALBUMINELP, A1GS, A2GS, BETS, BETA2SER, GAMS, MSPIKE, SPEI  No results found for: KPAFRELGTCHN, LAMBDASER, [facility]  Lab Results  Component Value Date   WBC 2.8 (L) 06/20/2020    NEUTROABS 1.8 06/20/2020   HGB 11.9 (L) 06/20/2020   HCT 35.1 (L) 06/20/2020   MCV 92.6 06/20/2020   PLT 313 06/20/2020      Chemistry      Component Value Date/Time   NA 136 06/14/2020 0914   K 3.4 (L) 06/14/2020 0914   CL 104 06/14/2020 0914   CO2 27 06/14/2020 0914   BUN 9 06/14/2020 0914   CREATININE 0.80 06/14/2020 0914      Component Value Date/Time   CALCIUM 10.8 (H) 06/14/2020 0914   ALKPHOS 108 06/14/2020 0914   AST 14 (L) 06/14/2020 0914   ALT 14 06/14/2020 0914   BILITOT 0.5 06/14/2020 0914       No results found for: LABCA2  No components found for: ONGEXB284  No results for input(s): INR in the last 168 hours.  No results found for: LABCA2  No results found for: XLK440  No results found for: NUU725  No results found for: DGU440  No results found for: CA2729  No components found for: HGQUANT  No results found for: CEA1 / No results found for: CEA1   No results found for: AFPTUMOR  No results found for: CHROMOGRNA  No results found for: HGBA, HGBA2QUANT, HGBFQUANT, HGBSQUAN (Hemoglobinopathy evaluation)   No results found for: LDH  No results found for: IRON, TIBC, IRONPCTSAT (Iron and TIBC)  No results found for: FERRITIN  Urinalysis    Component Value Date/Time   COLORURINE YELLOW 05/24/2020 0950   APPEARANCEUR HAZY (A) 05/24/2020 0950   LABSPEC 1.015 05/24/2020 0950   PHURINE 6.0 05/24/2020 0950   GLUCOSEU NEGATIVE 05/24/2020 0950   HGBUR NEGATIVE 05/24/2020 [place] 05/24/2020 0950   KETONESUR NEGATIVE 05/24/2020 0950   PROTEINUR 100 (A) 05/24/2020 0950   NITRITE NEGATIVE 05/24/2020 0950   LEUKOCYTESUR SMALL (A) 05/24/2020 0950     STUDIES: No results found.   ELIGIBLE FOR AVAILABLE RESEARCH PROTOCOL: no  ASSESSMENT: 75 y.o. [place] woman status post left breast upper inner quadrant biopsy 01/15/2020 for a clinical T2 NX, stage IIB anaplastic carcinoma, triple negative, with an MIB-1  of 12%  (a) bone scan and CT chest 02/12/2020 show no evidence of metastatic disease  (1) neoadjuvant chemotherapy with doxorubicin and cyclophosphamide in dose dense fashion x4 started  02/23/2020, followed by weekly paclitaxel and carboplatin x12 starting 04/19/2020  (a) echo 02/12/2020 shows an ejection fraction in the 60-65% range  (2) definitive surgery to follow  (3) adjuvant radiation as appropriate   PLAN: Carron will proceed to the ninth of 12 planned weekly doses of carboplatin and paclitaxel today.  She has not developed peripheral neuropathy and her ANC is adequate.  The mass is still palpable.  We are going to obtain a breast MRI the second week in February and then she will meet with her surgeon Dr.Toth to discuss her definitive surgery.  I do not think the discomfort she is experiencing at the tip of her right thumb is related to neuropathy but we will continue to inquire regarding neuropathy with subsequent cycles.  They know to call for any other issue that may develop before the next visit.  Total encounter time 25 minutes.Chauncey Cruel, MD   06/20/2020 9:43 AM Medical Oncology and Hematology [facility] [place], [place] [address] Tel. [phone]    Fax. [phone]   I, Wilburn Mylar, am acting as scribe for Dr. [name]. Zarriah Starkel.  I, Lurline Del MD, have reviewed the above documentation for accuracy and completeness, and I agree with the above.   *Total Encounter Time as defined by the Centers for Medicare and Medicaid Services includes, in addition to the face-to-face time of a patient visit (documented in the note above) non-face-to-face time: obtaining and reviewing outside history, ordering and reviewing medications, tests or procedures, care coordination (communications with other health care professionals or caregivers) and documentation in the medical record.

## 2020-06-20 ENCOUNTER — Encounter: Payer: Self-pay | Admitting: *Deleted

## 2020-06-20 ENCOUNTER — Inpatient Hospital Stay: Payer: Medicare Other

## 2020-06-20 ENCOUNTER — Other Ambulatory Visit: Payer: Self-pay | Admitting: *Deleted

## 2020-06-20 ENCOUNTER — Other Ambulatory Visit: Payer: Self-pay

## 2020-06-20 ENCOUNTER — Inpatient Hospital Stay (HOSPITAL_BASED_OUTPATIENT_CLINIC_OR_DEPARTMENT_OTHER): Payer: Medicare Other | Admitting: Oncology

## 2020-06-20 VITALS — HR 103

## 2020-06-20 VITALS — BP 122/76 | HR 108 | Temp 97.6°F | Resp 18 | Ht 63.0 in | Wt 148.9 lb

## 2020-06-20 DIAGNOSIS — Z5111 Encounter for antineoplastic chemotherapy: Secondary | ICD-10-CM | POA: Diagnosis not present

## 2020-06-20 DIAGNOSIS — Z171 Estrogen receptor negative status [ER-]: Secondary | ICD-10-CM

## 2020-06-20 DIAGNOSIS — C50919 Malignant neoplasm of unspecified site of unspecified female breast: Secondary | ICD-10-CM

## 2020-06-20 DIAGNOSIS — C50212 Malignant neoplasm of upper-inner quadrant of left female breast: Secondary | ICD-10-CM | POA: Diagnosis not present

## 2020-06-20 DIAGNOSIS — Z95828 Presence of other vascular implants and grafts: Secondary | ICD-10-CM

## 2020-06-20 LAB — CBC WITH DIFFERENTIAL (CANCER CENTER ONLY)
Abs Immature Granulocytes: 0.01 K/uL (ref 0.00–0.07)
Basophils Absolute: 0 K/uL (ref 0.0–0.1)
Basophils Relative: 0 %
Eosinophils Absolute: 0 K/uL (ref 0.0–0.5)
Eosinophils Relative: 0 %
HCT: 35.1 % — ABNORMAL LOW (ref 36.0–46.0)
Hemoglobin: 11.9 g/dL — ABNORMAL LOW (ref 12.0–15.0)
Immature Granulocytes: 0 %
Lymphocytes Relative: 27 %
Lymphs Abs: 0.8 K/uL (ref 0.7–4.0)
MCH: 31.4 pg (ref 26.0–34.0)
MCHC: 33.9 g/dL (ref 30.0–36.0)
MCV: 92.6 fL (ref 80.0–100.0)
Monocytes Absolute: 0.3 K/uL (ref 0.1–1.0)
Monocytes Relative: 9 %
Neutro Abs: 1.8 K/uL (ref 1.7–7.7)
Neutrophils Relative %: 64 %
Platelet Count: 313 K/uL (ref 150–400)
RBC: 3.79 MIL/uL — ABNORMAL LOW (ref 3.87–5.11)
RDW: 14.2 % (ref 11.5–15.5)
WBC Count: 2.8 K/uL — ABNORMAL LOW (ref 4.0–10.5)
nRBC: 0 % (ref 0.0–0.2)

## 2020-06-20 LAB — CMP (CANCER CENTER ONLY)
ALT: 14 U/L (ref 0–44)
AST: 14 U/L — ABNORMAL LOW (ref 15–41)
Albumin: 3.6 g/dL (ref 3.5–5.0)
Alkaline Phosphatase: 97 U/L (ref 38–126)
Anion gap: 9 (ref 5–15)
BUN: 12 mg/dL (ref 8–23)
CO2: 23 mmol/L (ref 22–32)
Calcium: 10.7 mg/dL — ABNORMAL HIGH (ref 8.9–10.3)
Chloride: 100 mmol/L (ref 98–111)
Creatinine: 0.88 mg/dL (ref 0.44–1.00)
GFR, Estimated: >60 mL/min (ref >60)
Glucose, Bld: 151 mg/dL — ABNORMAL HIGH (ref 70–99)
Potassium: 3 mmol/L — ABNORMAL LOW (ref 3.5–5.1)
Sodium: 132 mmol/L — ABNORMAL LOW (ref 135–145)
Total Bilirubin: 0.8 mg/dL (ref 0.3–1.2)
Total Protein: 6.8 g/dL (ref 6.5–8.1)

## 2020-06-20 MED ORDER — SODIUM CHLORIDE 0.9% FLUSH
10.0000 mL | INTRAVENOUS | Status: DC | PRN
  Administered 2020-06-20: 10 mL
  Filled 2020-06-20: qty 10

## 2020-06-20 MED ORDER — SODIUM CHLORIDE 0.9 % IV SOLN
Freq: Once | INTRAVENOUS | Status: AC
  Filled 2020-06-20: qty 250

## 2020-06-20 MED ORDER — PALONOSETRON HCL INJECTION 0.25 MG/5ML
0.2500 mg | Freq: Once | INTRAVENOUS | Status: AC
  Administered 2020-06-20: 0.25 mg via INTRAVENOUS

## 2020-06-20 MED ORDER — PALONOSETRON HCL INJECTION 0.25 MG/5ML
INTRAVENOUS | Status: AC
  Filled 2020-06-20: qty 5

## 2020-06-20 MED ORDER — SODIUM CHLORIDE 0.9 % IV SOLN
10.0000 mg | Freq: Once | INTRAVENOUS | Status: AC
  Administered 2020-06-20: 10 mg via INTRAVENOUS
  Filled 2020-06-20: qty 10

## 2020-06-20 MED ORDER — DIPHENHYDRAMINE HCL 50 MG/ML IJ SOLN
25.0000 mg | Freq: Once | INTRAMUSCULAR | Status: AC
  Administered 2020-06-20: 25 mg via INTRAVENOUS

## 2020-06-20 MED ORDER — HEPARIN SOD (PORK) LOCK FLUSH 100 UNIT/ML IV SOLN
500.0000 [IU] | Freq: Once | INTRAVENOUS | Status: AC | PRN
  Administered 2020-06-20: 500 [IU]
  Filled 2020-06-20: qty 5

## 2020-06-20 MED ORDER — FAMOTIDINE IN NACL 20-0.9 MG/50ML-% IV SOLN
20.0000 mg | Freq: Once | INTRAVENOUS | Status: AC
  Administered 2020-06-20: 20 mg via INTRAVENOUS

## 2020-06-20 MED ORDER — CARBOPLATIN CHEMO INJECTION 450 MG/45ML
164.4000 mg | Freq: Once | INTRAVENOUS | Status: AC
Start: 2020-06-20 — End: 2020-06-20
  Administered 2020-06-20: 160 mg via INTRAVENOUS
  Filled 2020-06-20: qty 16

## 2020-06-20 MED ORDER — SODIUM CHLORIDE 0.9 % IV SOLN
80.0000 mg/m2 | Freq: Once | INTRAVENOUS | Status: AC
  Administered 2020-06-20: 144 mg via INTRAVENOUS
  Filled 2020-06-20: qty 24

## 2020-06-20 MED ORDER — FAMOTIDINE IN NACL 20-0.9 MG/50ML-% IV SOLN
INTRAVENOUS | Status: AC
  Filled 2020-06-20: qty 50

## 2020-06-20 MED ORDER — DIPHENHYDRAMINE HCL 50 MG/ML IJ SOLN
INTRAMUSCULAR | Status: AC
  Filled 2020-06-20: qty 1

## 2020-06-20 NOTE — Progress Notes (Signed)
Per Dr. Jana Hakim, ok to treat with HR of 103.

## 2020-06-20 NOTE — Patient Instructions (Signed)
[facility] Discharge Instructions for Patients Receiving Chemotherapy  Today you received the following chemotherapy agents carboplatin, paclitaxel.  To help prevent nausea and vomiting after your treatment, we encourage you to take your nausea medication as directed.    If you develop nausea and vomiting that is not controlled by your nausea medication, call the clinic.   BELOW ARE SYMPTOMS THAT SHOULD BE REPORTED IMMEDIATELY:  *FEVER GREATER THAN 100.5 F  *CHILLS WITH OR WITHOUT FEVER  NAUSEA AND VOMITING THAT IS NOT CONTROLLED WITH YOUR NAUSEA MEDICATION  *UNUSUAL SHORTNESS OF BREATH  *UNUSUAL BRUISING OR BLEEDING  TENDERNESS IN MOUTH AND THROAT WITH OR WITHOUT PRESENCE OF ULCERS  *URINARY PROBLEMS  *BOWEL PROBLEMS  UNUSUAL RASH Items with * indicate a potential emergency and should be followed up as soon as possible.  Feel free to call the clinic should you have any questions or concerns. The clinic phone number is [phone].  Please show the CHEMO ALERT CARD at check-in to the Emergency Department and triage nurse.   

## 2020-06-27 NOTE — Progress Notes (Incomplete)
[name]  Telephone:(336) [phone] Fax:(336) [phone]     ID: Donna [name] DOB: 1945/10/14  MR#: [id number]  QDI#:[id number]  Patient Care Team: Montez Morita, PA-C as PCP - General (Hematology and Oncology) Magrinat, [name], MD as Consulting Physician (Oncology) [name], MD as Consulting Physician (General Surgery) [name], MD as Consulting Physician (Orthopedic Surgery) [name], RN as Oncology Nurse Navigator [name], RN as Oncology Nurse Navigator [name] OTHER MD: [name] MD ([facility]); [name] NP   CHIEF COMPLAINT: Triple negative breast cancer  CURRENT TREATMENT: neoadjuvant chemotherapy   INTERVAL HISTORY: Lupie returns today for follow up and treatment of her triple negative breast cancer accompanied by her son frederick.  She completed 4 cycles of Cytoxan and Adriamycin and began weekly carboplatin and paclitaxel 04/19/2020. Today is week 10 out of 12 planned.    REVIEW OF SYSTEMS: Pamala Hurry    COVID 28 VACCINATION STATUS: s/p Moderna x2, with booster pending   HISTORY OF CURRENT ILLNESS: From the original intake note:  Donna [name] herself palpated a mass in her left breast during showering.  She brought this to medical attention and on 12/16/2019 underwent mammography, the results of which I do not have today.  She proceeded to left breast ultrasound showing breast density B.  There was a mass in the upper inner quadrant of the left breast at the 10 o'clock position 7 cm from the nipple.  It measured 3.26 cm.  There are no ultrasound comments regarding the axilla.  Biopsy of the mass in question was performed 01/15/2020 at [place] [facility] in [facility] and the pathology report describes a biphasic tumor with malignant epithelial component and a mesenchymal component.  The epithelial component appears to squamoid and stain strongly for CK 8/18 by immunohistochemistry.  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  The tumor was essentially triple negative, estrogen receptor 0%, progesterone receptor 1% with 1+ intensity, HER-2 1+ by immunohistochemistry, Ki-67 12%.  (The 1% progesterone receptor positivity is best read as an artifact as the progesterone receptor does not function in the absence of a functional estrogen receptor).  The patient's subsequent history is as detailed below   PAST MEDICAL HISTORY: Past Medical History:  Diagnosis Date  . Arthritis    oa  . History of kidney stones   . Hypertension     PAST SURGICAL HISTORY: Past Surgical History:  Procedure Laterality Date  . CYSTOSCOPY  yrs ago  . IR IMAGING GUIDED PORT INSERTION  02/16/2020  . TOTAL KNEE ARTHROPLASTY Left 06/05/2016   Procedure: LEFT TOTAL KNEE ARTHROPLASTY;  Surgeon: [name], MD;  Location: [facility];  Service: Orthopedics;  Laterality: Left;    FAMILY HISTORY No family history on file.  The patient has no information on her father.  Her mother died at the age of 32.  She had 1 sister and 2 brothers.  No one on that side of the family is known to have had cancer.  The patient herself had 5/2 siblings, 2 sisters and 3 brothers, none with a history of cancer.   GYNECOLOGIC HISTORY:  No LMP recorded. Patient is postmenopausal. Menarche: 75 years old Age at first live birth: 75 years old [place] P 2 LMP mid 7s Contraceptive no HRT no  Hysterectomy?  No Salpingo-oophorectomy?  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  She is divorced and her former husband subsequently died.  She lives by herself with no pets.  Her son Donna [name] 21 is a Production manager and travels a great deal as a Optometrist.  Daughter Donna [name] teaches middle school in [place].  The patient has 4 grandchildren, no great-grandchildren.  She is a Psychologist, forensic.    ADVANCED DIRECTIVES: Not in  place   HEALTH MAINTENANCE: Social History   Tobacco Use  . Smoking status: Never Smoker  . Smokeless tobacco: Never Used  Substance Use Topics  . Alcohol use: No  . Drug use: No     Colonoscopy: Never  PAP: Remote  Bone density: Remote   Allergies  Allergen Reactions  . Benazepril Swelling    Patient had severe angioedema on 10/05/2015.    Current Outpatient Medications  Medication Sig Dispense Refill  . amLODipine (NORVASC) 10 MG tablet Take 10 mg by mouth daily after lunch.     Marland Kitchen aspirin 81 MG chewable tablet Chew 1 tablet (81 mg total) by mouth 2 (two) times daily. Take for 4 weeks. 60 tablet 0  . chlorthalidone (HYGROTON) 25 MG tablet Take 25 mg by mouth daily after lunch.     . Cholecalciferol (VITAMIN D) 2000 units CAPS Take 2,000 Units by mouth daily after lunch.    . ciprofloxacin (CIPRO) 500 MG tablet Take 1 tablet (500 mg total) by mouth 2 (two) times daily. 10 tablet 0  . dexamethasone (DECADRON) 4 MG tablet Take 2 tablets by mouth daily starting the day after Carboplatin and Cytoxan x 3 days. Take with food. 30 tablet 1  . lidocaine-prilocaine (EMLA) cream Apply to affected area once 30 g 3  . loratadine (CLARITIN) 10 MG tablet TAKE 1 TABLET BY MOUTH DAILY. STARTING THE DAY AFTER CHEMOTHERAPY FOR 10 DAYS, THEN AS NEEDED 90 tablet 1  . losartan (COZAAR) 50 MG tablet Take 50 mg by mouth daily after lunch.    . Multiple Vitamins-Minerals (CENTRUM ADULTS) TABS 1 tab(s)    . potassium chloride (MICRO-K) 10 MEQ CR capsule Take 10 mEq by mouth 2 (two) times daily.    . prochlorperazine (COMPAZINE) 10 MG tablet Take 1 tablet (10 mg total) by mouth every 6 (six) hours as needed (Nausea or vomiting). 30 tablet 1   No current facility-administered medications for this visit.    OBJECTIVE: African-American woman in no acute distress There were no vitals filed for this visit.   There is no height or weight on file to calculate BMI.   Wt Readings from Last 3 Encounters:   06/20/20 148 lb 14.4 oz (67.5 kg)  06/06/20 156 lb 12.8 oz (71.1 kg)  05/24/20 149 lb 14.4 oz (68 kg)      ECOG FS:1 - Symptomatic but completely ambulatory  Sclerae unicteric, EOMs intact Wearing a mask No cervical or supraclavicular adenopathy Lungs no rales or rhonchi Heart regular rate and rhythm Abd soft, nontender, positive bowel sounds MSK no focal spinal tenderness, no upper extremity lymphedema Neuro: nonfocal, well oriented, appropriate affect Breasts:    {Sclerae unicteric, EOMs intact Wearing a mask No cervical or supraclavicular adenopathy Lungs no rales or rhonchi Heart regular rate and rhythm Abd soft, nontender, positive bowel sounds MSK no focal spinal tenderness, no upper extremity lymphedema Neuro: nonfocal, well oriented, appropriate affect Breasts: In the lower inner quadrant of the left breast the mass is still palpable, measuring approximately 3 cm, movable, without skin involvement.}   LAB RESULTS:  CMP  Component Value Date/Time   NA 132 (L) 06/20/2020 0857   K 3.0 (L) 06/20/2020 0857   CL 100 06/20/2020 0857   CO2 23 06/20/2020 0857   GLUCOSE 151 (H) 06/20/2020 0857   BUN 12 06/20/2020 0857   CREATININE 0.88 06/20/2020 0857   CALCIUM 10.7 (H) 06/20/2020 0857   PROT 6.8 06/20/2020 0857   ALBUMIN 3.6 06/20/2020 0857   AST 14 (L) 06/20/2020 0857   ALT 14 06/20/2020 0857   ALKPHOS 97 06/20/2020 0857   BILITOT 0.8 06/20/2020 0857   GFRNONAA >60 06/20/2020 0857   GFRAA >60 03/08/2020 0906    No results found for: TOTALPROTELP, ALBUMINELP, A1GS, A2GS, BETS, BETA2SER, GAMS, MSPIKE, SPEI  No results found for: KPAFRELGTCHN, LAMBDASER, KAPLAMBRATIO  Lab Results  Component Value Date   WBC 2.8 (L) 06/20/2020   NEUTROABS 1.8 06/20/2020   HGB 11.9 (L) 06/20/2020   HCT 35.1 (L) 06/20/2020   MCV 92.6 06/20/2020   PLT 313 06/20/2020      Chemistry      Component Value Date/Time   NA 132 (L) 06/20/2020 0857   K 3.0 (L) 06/20/2020  0857   CL 100 06/20/2020 0857   CO2 23 06/20/2020 0857   BUN 12 06/20/2020 0857   CREATININE 0.88 06/20/2020 0857      Component Value Date/Time   CALCIUM 10.7 (H) 06/20/2020 0857   ALKPHOS 97 06/20/2020 0857   AST 14 (L) 06/20/2020 0857   ALT 14 06/20/2020 0857   BILITOT 0.8 06/20/2020 0857       No results found for: LABCA2  No components found for: NLZJQB341  No results for input(s): INR in the last 168 hours.  No results found for: LABCA2  No results found for: PFX902  No results found for: IOX735  No results found for: HGD924  No results found for: CA2729  No components found for: HGQUANT  No results found for: CEA1 / No results found for: CEA1   No results found for: AFPTUMOR  No results found for: CHROMOGRNA  No results found for: HGBA, HGBA2QUANT, HGBFQUANT, HGBSQUAN (Hemoglobinopathy evaluation)   No results found for: LDH  No results found for: IRON, TIBC, IRONPCTSAT (Iron and TIBC)  No results found for: FERRITIN  Urinalysis    Component Value Date/Time   COLORURINE YELLOW 05/24/2020 0950   APPEARANCEUR HAZY (A) 05/24/2020 0950   LABSPEC 1.015 05/24/2020 0950   PHURINE 6.0 05/24/2020 0950   GLUCOSEU NEGATIVE 05/24/2020 0950   HGBUR NEGATIVE 05/24/2020 [place] 05/24/2020 0950   KETONESUR NEGATIVE 05/24/2020 0950   PROTEINUR 100 (A) 05/24/2020 0950   NITRITE NEGATIVE 05/24/2020 0950   LEUKOCYTESUR SMALL (A) 05/24/2020 0950    STUDIES: No results found.   ELIGIBLE FOR AVAILABLE RESEARCH PROTOCOL: no  ASSESSMENT: 75 y.o. [place] woman status post left breast upper inner quadrant biopsy 01/15/2020 for a clinical T2 NX, stage IIB anaplastic carcinoma, triple negative, with an MIB-1 of 12%  (a) bone scan and CT chest 02/12/2020 show no evidence of metastatic disease  (1) neoadjuvant chemotherapy with doxorubicin and cyclophosphamide in dose dense fashion x4 started 02/23/2020, followed by weekly  paclitaxel and carboplatin x12 starting 04/19/2020  (a) echo 02/12/2020 shows an ejection fraction in the 60-65% range  (2) definitive surgery to follow  (3) adjuvant radiation as appropriate   PLAN: Tishara will proceed to the ninth of 12 planned weekly doses of carboplatin and paclitaxel today.  She has not developed peripheral neuropathy and  her [place] is adequate.  The mass is still palpable.  We are going to obtain a breast MRI the second week in February and then she will meet with her surgeon Dr.Toth to discuss her definitive surgery.  I do not think the discomfort she is experiencing at the tip of her right thumb is related to neuropathy but we will continue to inquire regarding neuropathy with subsequent cycles.  They know to call for any other issue that may develop before the next visit.  Total encounter time 25 minutes.[name]   06/27/2020 11:47 AM Medical Oncology and Hematology [facility] [place], [place] [address] Tel. [phone]    Fax. [phone]   I, Wilburn Mylar, am acting as scribe for Dr. [name]. Magrinat.  I, Lurline Del MD, have reviewed the above documentation for accuracy and completeness, and I agree with the above.   *Total Encounter Time as defined by the Centers for Medicare and Medicaid Services includes, in addition to the face-to-face time of a patient visit (documented in the note above) non-face-to-face time: obtaining and reviewing outside history, ordering and reviewing medications, tests or procedures, care coordination (communications with other health care professionals or caregivers) and documentation in the medical record.

## 2020-06-28 ENCOUNTER — Inpatient Hospital Stay: Payer: Medicare Other

## 2020-06-28 ENCOUNTER — Encounter: Payer: Self-pay | Admitting: *Deleted

## 2020-06-28 ENCOUNTER — Inpatient Hospital Stay: Payer: Medicare Other | Admitting: Oncology

## 2020-07-05 ENCOUNTER — Other Ambulatory Visit: Payer: Self-pay

## 2020-07-05 ENCOUNTER — Encounter: Payer: Self-pay | Admitting: Nutrition

## 2020-07-05 ENCOUNTER — Inpatient Hospital Stay: Payer: Medicare Other

## 2020-07-05 ENCOUNTER — Other Ambulatory Visit: Payer: Self-pay | Admitting: *Deleted

## 2020-07-05 VITALS — BP 136/95 | HR 89 | Temp 98.1°F | Resp 18

## 2020-07-05 DIAGNOSIS — Z95828 Presence of other vascular implants and grafts: Secondary | ICD-10-CM

## 2020-07-05 DIAGNOSIS — Z171 Estrogen receptor negative status [ER-]: Secondary | ICD-10-CM

## 2020-07-05 DIAGNOSIS — Z5111 Encounter for antineoplastic chemotherapy: Secondary | ICD-10-CM | POA: Diagnosis not present

## 2020-07-05 DIAGNOSIS — C50212 Malignant neoplasm of upper-inner quadrant of left female breast: Secondary | ICD-10-CM

## 2020-07-05 DIAGNOSIS — C50919 Malignant neoplasm of unspecified site of unspecified female breast: Secondary | ICD-10-CM

## 2020-07-05 LAB — CBC WITH DIFFERENTIAL (CANCER CENTER ONLY)
Abs Immature Granulocytes: 0.01 10*3/uL (ref 0.00–0.07)
Basophils Absolute: 0 10*3/uL (ref 0.0–0.1)
Basophils Relative: 1 %
Eosinophils Absolute: 0 10*3/uL (ref 0.0–0.5)
Eosinophils Relative: 0 %
HCT: 38.7 % (ref 36.0–46.0)
Hemoglobin: 12.7 g/dL (ref 12.0–15.0)
Immature Granulocytes: 0 %
Lymphocytes Relative: 31 %
Lymphs Abs: 0.9 10*3/uL (ref 0.7–4.0)
MCH: 30.8 pg (ref 26.0–34.0)
MCHC: 32.8 g/dL (ref 30.0–36.0)
MCV: 93.7 fL (ref 80.0–100.0)
Monocytes Absolute: 0.5 10*3/uL (ref 0.1–1.0)
Monocytes Relative: 15 %
Neutro Abs: 1.6 10*3/uL — ABNORMAL LOW (ref 1.7–7.7)
Neutrophils Relative %: 53 %
Platelet Count: 291 10*3/uL (ref 150–400)
RBC: 4.13 MIL/uL (ref 3.87–5.11)
RDW: 14.9 % (ref 11.5–15.5)
WBC Count: 3 10*3/uL — ABNORMAL LOW (ref 4.0–10.5)
nRBC: 0 % (ref 0.0–0.2)

## 2020-07-05 LAB — CMP (CANCER CENTER ONLY)
ALT: 11 U/L (ref 0–44)
AST: 14 U/L — ABNORMAL LOW (ref 15–41)
Albumin: 3.7 g/dL (ref 3.5–5.0)
Alkaline Phosphatase: 86 U/L (ref 38–126)
Anion gap: 9 (ref 5–15)
BUN: 12 mg/dL (ref 8–23)
CO2: 26 mmol/L (ref 22–32)
Calcium: 10.8 mg/dL — ABNORMAL HIGH (ref 8.9–10.3)
Chloride: 103 mmol/L (ref 98–111)
Creatinine: 0.86 mg/dL (ref 0.44–1.00)
GFR, Estimated: >60 mL/min (ref >60)
Glucose, Bld: 134 mg/dL — ABNORMAL HIGH (ref 70–99)
Potassium: 3.1 mmol/L — ABNORMAL LOW (ref 3.5–5.1)
Sodium: 138 mmol/L (ref 135–145)
Total Bilirubin: 0.4 mg/dL (ref 0.3–1.2)
Total Protein: 7 g/dL (ref 6.5–8.1)

## 2020-07-05 MED ORDER — FAMOTIDINE IN NACL 20-0.9 MG/50ML-% IV SOLN
INTRAVENOUS | Status: AC
  Filled 2020-07-05: qty 50

## 2020-07-05 MED ORDER — PALONOSETRON HCL INJECTION 0.25 MG/5ML
INTRAVENOUS | Status: AC
  Filled 2020-07-05: qty 5

## 2020-07-05 MED ORDER — SODIUM CHLORIDE 0.9% FLUSH
10.0000 mL | INTRAVENOUS | Status: DC | PRN
  Administered 2020-07-05: 10 mL
  Filled 2020-07-05: qty 10

## 2020-07-05 MED ORDER — DIPHENHYDRAMINE HCL 50 MG/ML IJ SOLN
25.0000 mg | Freq: Once | INTRAMUSCULAR | Status: AC
  Administered 2020-07-05: 25 mg via INTRAVENOUS

## 2020-07-05 MED ORDER — HEPARIN SOD (PORK) LOCK FLUSH 100 UNIT/ML IV SOLN
500.0000 [IU] | Freq: Once | INTRAVENOUS | Status: AC | PRN
  Administered 2020-07-05: 500 [IU]
  Filled 2020-07-05: qty 5

## 2020-07-05 MED ORDER — SODIUM CHLORIDE 0.9 % IV SOLN
160.0000 mg | Freq: Once | INTRAVENOUS | Status: AC
  Administered 2020-07-05: 160 mg via INTRAVENOUS
  Filled 2020-07-05: qty 16

## 2020-07-05 MED ORDER — SODIUM CHLORIDE 0.9 % IV SOLN
10.0000 mg | Freq: Once | INTRAVENOUS | Status: AC
  Administered 2020-07-05: 10 mg via INTRAVENOUS
  Filled 2020-07-05: qty 10
  Filled 2020-07-05: qty 1

## 2020-07-05 MED ORDER — DIPHENHYDRAMINE HCL 50 MG/ML IJ SOLN
INTRAMUSCULAR | Status: AC
  Filled 2020-07-05: qty 1

## 2020-07-05 MED ORDER — FAMOTIDINE IN NACL 20-0.9 MG/50ML-% IV SOLN
20.0000 mg | Freq: Once | INTRAVENOUS | Status: AC
  Administered 2020-07-05: 20 mg via INTRAVENOUS

## 2020-07-05 MED ORDER — SODIUM CHLORIDE 0.9 % IV SOLN
80.0000 mg/m2 | Freq: Once | INTRAVENOUS | Status: AC
  Administered 2020-07-05: 144 mg via INTRAVENOUS
  Filled 2020-07-05: qty 24

## 2020-07-05 MED ORDER — SODIUM CHLORIDE 0.9 % IV SOLN
Freq: Once | INTRAVENOUS | Status: AC
  Filled 2020-07-05: qty 250

## 2020-07-05 MED ORDER — PALONOSETRON HCL INJECTION 0.25 MG/5ML
0.2500 mg | Freq: Once | INTRAVENOUS | Status: AC
  Administered 2020-07-05: 0.25 mg via INTRAVENOUS

## 2020-07-05 MED ORDER — LORATADINE 10 MG PO TABS: 10.0000 mg | ORAL_TABLET | Freq: Every day | ORAL | 1 refills | Status: DC | PRN

## 2020-07-05 NOTE — Patient Instructions (Signed)
[facility] Discharge Instructions for Patients Receiving Chemotherapy  Today you received the following chemotherapy agents: Paclitaxel (Taxol) and Carboplatin  To help prevent nausea and vomiting after your treatment, we encourage you to take your nausea medication  as prescribed.    If you develop nausea and vomiting that is not controlled by your nausea medication, call the clinic.   BELOW ARE SYMPTOMS THAT SHOULD BE REPORTED IMMEDIATELY:  *FEVER GREATER THAN 100.5 F  *CHILLS WITH OR WITHOUT FEVER  NAUSEA AND VOMITING THAT IS NOT CONTROLLED WITH YOUR NAUSEA MEDICATION  *UNUSUAL SHORTNESS OF BREATH  *UNUSUAL BRUISING OR BLEEDING  TENDERNESS IN MOUTH AND THROAT WITH OR WITHOUT PRESENCE OF ULCERS  *URINARY PROBLEMS  *BOWEL PROBLEMS  UNUSUAL RASH Items with * indicate a potential emergency and should be followed up as soon as possible.  Feel free to call the clinic should you have any questions or concerns. The clinic phone number is [phone].  Please show the CHEMO ALERT CARD at check-in to the Emergency Department and triage nurse.   

## 2020-07-05 NOTE — Progress Notes (Signed)
I provided coupons for ensure at patient request.

## 2020-07-05 NOTE — Progress Notes (Addendum)
Ok to treat today without provider appt. Pt has no complaints.  Hardie Pulley, RPh, BCPS, BCOP

## 2020-07-11 MED FILL — Dexamethasone Sodium Phosphate Inj 100 MG/10ML: INTRAMUSCULAR | Qty: 1 | Status: AC

## 2020-07-11 NOTE — Progress Notes (Signed)
[place]  [phone] [phone]     ID: Donna [name] DOB: Nov 15, 1945  MR#: [id number]  HER#:[id number]  Patient Care Team: Donna Morita, PA-C as PCP - General (Hematology and Oncology) Donna [name], Donna Dad, MD as Consulting Physician (Oncology) Donna Kussmaul, MD as Consulting Physician (General Surgery) Donna Cancel, MD as Consulting Physician (Orthopedic Surgery) Donna Kaufmann, RN as Oncology Nurse Navigator Donna Germany, RN as Oncology Nurse Navigator Donna Cruel, MD OTHER MD: Donna Lean MD ([place]); Donna Bolster NP   CHIEF COMPLAINT: Triple negative breast cancer  CURRENT TREATMENT: neoadjuvant chemotherapy   INTERVAL HISTORY: Donna [name] returns today for follow up and treatment of her triple negative breast cancer accompanied by her son Donna [name].  She completed 4 cycles of Cytoxan and Adriamycin and began weekly carboplatin and paclitaxel 04/19/2020. Today is week 11 out of 12 planned.   She is scheduled for breast MRI on 07/15/2020.   REVIEW OF SYSTEMS: Donna [name] has felt a bit weaker than usual.  She still has very little appetite.  She has lost about 15 pounds within the last 2 months.  She is having pain at the nailbeds and of course some nail dyscrasias.  Her allergy symptoms are a little bit better on Claritin.  She still has tearing.  She has not developed a fever or cough.  She has no peripheral neuropathy symptoms.  COVID 19 VACCINATION STATUS: s/p Moderna x2, with booster pending   HISTORY OF CURRENT ILLNESS: From the original intake note:  Donna [name] herself palpated a mass in her left breast during showering.  She brought this to medical attention and on 12/16/2019 underwent mammography, the results of which I do not have today.  She proceeded to left breast ultrasound showing breast density B.  There was a mass in the upper inner quadrant of the left breast at the 10 o'clock position 7 cm from the nipple.  It  measured 3.26 cm.  There are no ultrasound comments regarding the axilla.  Biopsy of the mass in question was performed 01/15/2020 at [facility] in [facility] and the pathology report describes a biphasic tumor with malignant epithelial component and a mesenchymal component.  The epithelial component appears to squamoid and stain strongly for CK 8/18 by immunohistochemistry.  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  The tumor was essentially triple negative, estrogen receptor 0%, progesterone receptor 1% with 1+ intensity, HER-2 1+ by immunohistochemistry, Ki-67 12%.  (The 1% progesterone receptor positivity is best read as an artifact as the progesterone receptor does not function in the absence of a functional estrogen receptor).  The patient's subsequent history is as detailed below   PAST MEDICAL HISTORY: Past Medical History:  Diagnosis Date  . Arthritis    oa  . History of kidney stones   . Hypertension     PAST SURGICAL HISTORY: Past Surgical History:  Procedure Laterality Date  . CYSTOSCOPY  yrs ago  . IR IMAGING GUIDED PORT INSERTION  02/16/2020  . TOTAL KNEE ARTHROPLASTY Left 06/05/2016   Procedure: LEFT TOTAL KNEE ARTHROPLASTY;  Surgeon: Donna Cancel, MD;  Location: [facility];  Service: Orthopedics;  Laterality: Left;    FAMILY HISTORY No family history on file.  The patient has no information on her father.  Her mother died at the age of 42.  She had 1 sister and 2 brothers.  No one on that side of the family  is known to have had cancer.  The patient herself had 5/2 siblings, 2 sisters and 3 brothers, none with a history of cancer.   GYNECOLOGIC HISTORY:  No LMP recorded. Patient is postmenopausal. Menarche: 75 years old Age at first live birth: 75 years old [place] P 2 LMP mid 48s Contraceptive no HRT no  Hysterectomy?  No Salpingo-oophorectomy?  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  She is divorced and her former husband subsequently died.  She lives by herself with no pets.  Her son Donna [name] 61 is a Production manager and travels a great deal as a Optometrist.  Daughter Donna [name] teaches middle school in [place].  The patient has 4 grandchildren, no great-grandchildren.  She is a Psychologist, forensic.    ADVANCED DIRECTIVES: Not in place   HEALTH MAINTENANCE: Social History   Tobacco Use  . Smoking status: Never Smoker  . Smokeless tobacco: Never Used  Substance Use Topics  . Alcohol use: No  . Drug use: No     Colonoscopy: Never  PAP: Remote  Bone density: Remote   Allergies  Allergen Reactions  . Benazepril Swelling    Patient had severe angioedema on 10/05/2015.    Current Outpatient Medications  Medication Sig Dispense Refill  . amLODipine (NORVASC) 10 MG tablet Take 10 mg by mouth daily after lunch.     Marland Kitchen aspirin 81 MG chewable tablet Chew 1 tablet (81 mg total) by mouth 2 (two) times daily. Take for 4 weeks. 60 tablet 0  . chlorthalidone (HYGROTON) 25 MG tablet Take 25 mg by mouth daily after lunch.     . Cholecalciferol (VITAMIN D) 2000 units CAPS Take 2,000 Units by mouth daily after lunch.    . ciprofloxacin (CIPRO) 500 MG tablet Take 1 tablet (500 mg total) by mouth 2 (two) times daily. 10 tablet 0  . dexamethasone (DECADRON) 4 MG tablet Take 2 tablets by mouth daily starting the day after Carboplatin and Cytoxan x 3 days. Take with food. 30 tablet 1  . lidocaine-prilocaine (EMLA) cream Apply to affected area once 30 g 3  . loratadine (CLARITIN) 10 MG tablet Take 1 tablet (10 mg total) by mouth daily as needed for allergies. 90 tablet 1  . losartan (COZAAR) 50 MG tablet Take 50 mg by mouth daily after lunch.    . Multiple Vitamins-Minerals (CENTRUM ADULTS) TABS 1 tab(s)    . potassium chloride (MICRO-K) 10 MEQ CR capsule Take 10 mEq by mouth 2 (two) times daily.    . prochlorperazine (COMPAZINE)  10 MG tablet Take 1 tablet (10 mg total) by mouth every 6 (six) hours as needed (Nausea or vomiting). 30 tablet 1   No current facility-administered medications for this visit.    OBJECTIVE: African-American woman who appears stated age 75:   07/12/20 0801  BP: (!) 152/86  Pulse: (!) 110  Resp: 18  Temp: 97.7 F (36.5 C)  SpO2: 100%     Body mass index is 25.54 kg/m.   Wt Readings from Last 3 Encounters:  07/12/20 144 lb 3.2 oz (65.4 kg)  06/20/20 148 lb 14.4 oz (67.5 kg)  06/06/20 156 lb 12.8 oz (71.1 kg)      ECOG FS:1 - Symptomatic but completely ambulatory  Sclerae unicteric, EOMs intact Wearing a mask No cervical or supraclavicular adenopathy Lungs no rales or rhonchi Heart regular rate and rhythm Abd soft, nontender, positive bowel sounds MSK no focal spinal  tenderness, no upper extremity lymphedema Neuro: nonfocal, well oriented, appropriate affect Breasts: Deferred   LAB RESULTS:  CMP     Component Value Date/Time   NA 138 07/05/2020 0915   K 3.1 (L) 07/05/2020 0915   CL 103 07/05/2020 0915   CO2 26 07/05/2020 0915   GLUCOSE 134 (H) 07/05/2020 0915   BUN 12 07/05/2020 0915   CREATININE 0.86 07/05/2020 0915   CALCIUM 10.8 (H) 07/05/2020 0915   PROT 7.0 07/05/2020 0915   ALBUMIN 3.7 07/05/2020 0915   AST 14 (L) 07/05/2020 0915   ALT 11 07/05/2020 0915   ALKPHOS 86 07/05/2020 0915   BILITOT 0.4 07/05/2020 0915   GFRNONAA >60 07/05/2020 0915   GFRAA >60 03/08/2020 0906    No results found for: TOTALPROTELP, ALBUMINELP, A1GS, A2GS, BETS, BETA2SER, GAMS, MSPIKE, SPEI  No results found for: Nils Pyle, [facility]  Lab Results  Component Value Date   WBC 3.0 (L) 07/05/2020   NEUTROABS 1.6 (L) 07/05/2020   HGB 12.7 07/05/2020   HCT 38.7 07/05/2020   MCV 93.7 07/05/2020   PLT 291 07/05/2020      Chemistry      Component Value Date/Time   NA 138 07/05/2020 0915   K 3.1 (L) 07/05/2020 0915   CL 103 07/05/2020 0915   CO2 26  07/05/2020 0915   BUN 12 07/05/2020 0915   CREATININE 0.86 07/05/2020 0915      Component Value Date/Time   CALCIUM 10.8 (H) 07/05/2020 0915   ALKPHOS 86 07/05/2020 0915   AST 14 (L) 07/05/2020 0915   ALT 11 07/05/2020 0915   BILITOT 0.4 07/05/2020 0915       No results found for: LABCA2  No components found for: QPYPPJ093  No results for input(s): INR in the last 168 hours.  No results found for: LABCA2  No results found for: OIZ124  No results found for: PYK998  No results found for: PJA250  No results found for: CA2729  No components found for: HGQUANT  No results found for: CEA1 / No results found for: CEA1   No results found for: AFPTUMOR  No results found for: CHROMOGRNA  No results found for: HGBA, HGBA2QUANT, HGBFQUANT, HGBSQUAN (Hemoglobinopathy evaluation)   No results found for: LDH  No results found for: IRON, TIBC, IRONPCTSAT (Iron and TIBC)  No results found for: FERRITIN  Urinalysis    Component Value Date/Time   COLORURINE YELLOW 05/24/2020 0950   APPEARANCEUR HAZY (A) 05/24/2020 0950   LABSPEC 1.015 05/24/2020 0950   PHURINE 6.0 05/24/2020 0950   GLUCOSEU NEGATIVE 05/24/2020 0950   HGBUR NEGATIVE 05/24/2020 [place] 05/24/2020 0950   KETONESUR NEGATIVE 05/24/2020 0950   PROTEINUR 100 (A) 05/24/2020 0950   NITRITE NEGATIVE 05/24/2020 0950   LEUKOCYTESUR SMALL (A) 05/24/2020 0950    STUDIES: No results found.   ELIGIBLE FOR AVAILABLE RESEARCH PROTOCOL: no  ASSESSMENT: 75 y.o. [place] woman status post left breast upper inner quadrant biopsy 01/15/2020 for a clinical T2 NX, stage IIB anaplastic carcinoma, triple negative, with an MIB-1 of 12%  (a) bone scan and CT chest 02/12/2020 show no evidence of metastatic disease  (1) neoadjuvant chemotherapy with doxorubicin and cyclophosphamide in dose dense fashion x4 started 02/23/2020, followed by weekly paclitaxel and carboplatin x12 starting  04/19/2020  (a) echo 02/12/2020 shows an ejection fraction in the 60-65% range  (2) definitive surgery to follow  (3) adjuvant radiation as appropriate   PLAN: Stacie does not have  peripheral neuropathy and so we do not have an absolute contraindication to continuing treatment.  On the other hand she has lost quite a bit of weight and is having more symptoms.  I do think we may get this older patient into trouble if we push with the last 2 cycles.  We considered giving her a week off and then resuming therapy but I think missing the last 2 doses of Taxol really will make no difference at all to her long-term prognosis or minimal difference and she is already scheduled for MRI this week and to meet with her surgeon July 18, 2020.  I think the better part of valor is to stop chemotherapy at this point and proceed to surgery.  She and her son are in agreement with this plan.  Tentatively I have made her a virtual appointment with me on July 19, 2020 in case they have questions but if not then I will see her again after surgery in mid March and take it from there  They understand that I can still feel the mass in the left breast and therefore I do not anticipate a complete pathologic response from chemo.  I am hoping that the chemo has sterilized any cancer outside the breast which is of course what is life-threatening in this case  Total encounter time 35 minutes.Donna Cruel, MD   07/12/2020 8:04 AM Medical Oncology and Hematology [facility] [place], [place] [address] Tel. [phone]    Fax. [phone]   I, Wilburn Mylar, am acting as scribe for Dr. Virgie [name]. Donna [name].  I, Lurline Del MD, have reviewed the above documentation for accuracy and completeness, and I agree with the above.   *Total Encounter Time as defined by the Centers for Medicare and Medicaid Services includes, in addition to the face-to-face time of a patient visit  (documented in the note above) non-face-to-face time: obtaining and reviewing outside history, ordering and reviewing medications, tests or procedures, care coordination (communications with other health care professionals or caregivers) and documentation in the medical record.

## 2020-07-12 ENCOUNTER — Other Ambulatory Visit: Payer: Self-pay

## 2020-07-12 ENCOUNTER — Inpatient Hospital Stay: Payer: Medicare Other

## 2020-07-12 ENCOUNTER — Inpatient Hospital Stay: Payer: Medicare Other | Attending: Oncology | Admitting: Oncology

## 2020-07-12 ENCOUNTER — Encounter: Payer: Self-pay | Admitting: *Deleted

## 2020-07-12 VITALS — BP 152/86 | HR 110 | Temp 97.7°F | Resp 18 | Ht 63.0 in | Wt 144.2 lb

## 2020-07-12 DIAGNOSIS — Z79899 Other long term (current) drug therapy: Secondary | ICD-10-CM | POA: Diagnosis not present

## 2020-07-12 DIAGNOSIS — C50919 Malignant neoplasm of unspecified site of unspecified female breast: Secondary | ICD-10-CM

## 2020-07-12 DIAGNOSIS — Z171 Estrogen receptor negative status [ER-]: Secondary | ICD-10-CM | POA: Insufficient documentation

## 2020-07-12 DIAGNOSIS — Z7982 Long term (current) use of aspirin: Secondary | ICD-10-CM | POA: Diagnosis not present

## 2020-07-12 DIAGNOSIS — Z7952 Long term (current) use of systemic steroids: Secondary | ICD-10-CM | POA: Diagnosis not present

## 2020-07-12 DIAGNOSIS — C50212 Malignant neoplasm of upper-inner quadrant of left female breast: Secondary | ICD-10-CM | POA: Insufficient documentation

## 2020-07-12 DIAGNOSIS — I10 Essential (primary) hypertension: Secondary | ICD-10-CM | POA: Insufficient documentation

## 2020-07-14 ENCOUNTER — Telehealth: Payer: Self-pay | Admitting: Oncology

## 2020-07-14 NOTE — Telephone Encounter (Signed)
Scheduled appt per 2/1 los. Pt's daughter confirmed appt date and time.

## 2020-07-15 ENCOUNTER — Ambulatory Visit
Admission: RE | Admit: 2020-07-15 | Discharge: 2020-07-15 | Disposition: A | Discharge status: Home / Self Care | Payer: Medicare Other | Source: Ambulatory Visit | Attending: Oncology | Admitting: Oncology

## 2020-07-15 DIAGNOSIS — Z171 Estrogen receptor negative status [ER-]: Secondary | ICD-10-CM

## 2020-07-15 DIAGNOSIS — C50919 Malignant neoplasm of unspecified site of unspecified female breast: Secondary | ICD-10-CM

## 2020-07-15 MED ORDER — GADOBUTROL 1 MMOL/ML IV SOLN
7.0000 mL | Freq: Once | INTRAVENOUS | Status: AC | PRN
  Administered 2020-07-15: 7 mL via INTRAVENOUS

## 2020-07-18 ENCOUNTER — Encounter: Payer: Self-pay | Admitting: *Deleted

## 2020-07-18 ENCOUNTER — Other Ambulatory Visit: Payer: Self-pay | Admitting: Oncology

## 2020-07-20 ENCOUNTER — Inpatient Hospital Stay: Payer: Medicare Other | Admitting: Oncology

## 2020-07-26 ENCOUNTER — Other Ambulatory Visit: Payer: Self-pay

## 2020-07-26 ENCOUNTER — Encounter: Payer: Self-pay | Admitting: Plastic Surgery

## 2020-07-26 ENCOUNTER — Ambulatory Visit (INDEPENDENT_AMBULATORY_CARE_PROVIDER_SITE_OTHER): Payer: Medicare Other | Admitting: Plastic Surgery

## 2020-07-26 VITALS — BP 132/92 | HR 95 | Temp 96.9°F | Ht 63.0 in | Wt 146.0 lb

## 2020-07-26 DIAGNOSIS — C50919 Malignant neoplasm of unspecified site of unspecified female breast: Secondary | ICD-10-CM | POA: Diagnosis not present

## 2020-07-26 DIAGNOSIS — Z171 Estrogen receptor negative status [ER-]: Secondary | ICD-10-CM

## 2020-07-26 DIAGNOSIS — C50212 Malignant neoplasm of upper-inner quadrant of left female breast: Secondary | ICD-10-CM | POA: Diagnosis not present

## 2020-07-26 NOTE — Progress Notes (Signed)
Patient ID: Donna [name], female    DOB: 26-Sep-1945, 75 y.o.   MRN: [id number]   Chief Complaint  Patient presents with  . Advice Only  . Breast Cancer    The patient is a 75 year old female here for evaluation of her breasts for a consultation for breast reconstruction.  She palpated a mass in her Left breast while showering and underwent a mammogram.  This was followed by an ultrasound and a biopsy of the area in August 2021 the epithelial component appears to be squame wide and stain strongly for CK 818 by immunohistochemistry.  The mesenchymal component was a mixture of a chondroid appearance and showed a final diagnosis of metaplastic carcinoma also known as carcinosarcoma.  The tumor was estrogen and progesterone negative and HER-2 negative her daughter is with her today.  She also has a son who is involved in her care.  She is 5 feet 3 inches tall and weighs 146 pounds.  She has a past medical history positive for kidney stones, arthritis and hypertension.  She is currently living with her daughter.  She is being seen by Dr. Marlou Starks for the 4 cm mass in the lower portion of the left breast.  She has been getting her chemotherapy.  She seems to be well aware of her options and has not fully decided.   Review of Systems  Constitutional: Negative.  Negative for activity change and appetite change.  Eyes: Negative.   Respiratory: Negative for chest tightness and shortness of breath.   Cardiovascular: Negative for leg swelling.  Gastrointestinal: Negative.   Endocrine: Negative.   Genitourinary: Negative.   Musculoskeletal: Negative.   Neurological: Negative.   Hematological: Negative.   Psychiatric/Behavioral: Negative.     Past Medical History:  Diagnosis Date  . Arthritis    oa  . History of kidney stones   . Hypertension     Past Surgical History:  Procedure Laterality Date  . CYSTOSCOPY  yrs ago  . IR IMAGING GUIDED PORT INSERTION  02/16/2020  . TOTAL KNEE ARTHROPLASTY  Left 06/05/2016   Procedure: LEFT TOTAL KNEE ARTHROPLASTY;  Surgeon: [name], MD;  Location: [facility];  Service: Orthopedics;  Laterality: Left;      Current Outpatient Medications:  .  amLODipine (NORVASC) 10 MG tablet, Take 10 mg by mouth daily after lunch. , Disp: , Rfl:  .  aspirin 81 MG chewable tablet, Chew 1 tablet (81 mg total) by mouth 2 (two) times daily. Take for 4 weeks., Disp: 60 tablet, Rfl: 0 .  chlorthalidone (HYGROTON) 25 MG tablet, Take 25 mg by mouth daily after lunch. , Disp: , Rfl:  .  Cholecalciferol (VITAMIN D) 2000 units CAPS, Take 2,000 Units by mouth daily after lunch., Disp: , Rfl:  .  ciprofloxacin (CIPRO) 500 MG tablet, Take 1 tablet (500 mg total) by mouth 2 (two) times daily., Disp: 10 tablet, Rfl: 0 .  loratadine (CLARITIN) 10 MG tablet, Take 1 tablet (10 mg total) by mouth daily as needed for allergies., Disp: 90 tablet, Rfl: 1 .  losartan (COZAAR) 50 MG tablet, Take 50 mg by mouth daily after lunch., Disp: , Rfl:  .  Multiple Vitamins-Minerals (CENTRUM ADULTS) TABS, 1 tab(s), Disp: , Rfl:  .  potassium chloride (MICRO-K) 10 MEQ CR capsule, Take 10 mEq by mouth 2 (two) times daily., Disp: , Rfl:    Objective:   Vitals:   07/26/20 1406  BP: (!) 132/92  Pulse: 95  Temp: (!) 96.9  F (36.1 C)  SpO2: 99%    Physical Exam Vitals and nursing note reviewed.  Constitutional:      Appearance: Normal appearance.  HENT:     Head: Normocephalic and atraumatic.  Cardiovascular:     Rate and Rhythm: Normal rate.     Pulses: Normal pulses.  Pulmonary:     Effort: Pulmonary effort is normal. No respiratory distress.     Breath sounds: No wheezing.  Abdominal:     General: Abdomen is flat. There is no distension.     Tenderness: There is no abdominal tenderness.  Skin:    General: Skin is warm.     Capillary Refill: Capillary refill takes less than 2 seconds.     Coloration: Skin is not jaundiced.     Findings: No bruising.  Neurological:     General:  No focal deficit present.     Mental Status: She is alert and oriented to person, place, and time.  Psychiatric:        Mood and Affect: Mood normal.        Behavior: Behavior normal.        Thought Content: Thought content normal.     Assessment & Plan:  Metaplastic carcinoma of breast ([place])  Malignant neoplasm of upper-inner quadrant of left breast in female, estrogen receptor negative ([place])  We had a detailed conversation about the patient's options for breast reconstruction. Several options were explained to the patient.  The main options discussed with the patient were a mastectomy of the left breast.  Mastectomy of the left breast with a reduction and a lift on the right breast for better symmetry.  Bilateral breast reduction with excision of the tumor and coordination with general surgery as another option.  Certainly she can have only the left breast done and not the right.  She was also given a prescription for second to nature.  She knows that they can help with filling her bra if she is left with any asymmetry.  She knows that surgery for reconstruction of the left breast if she has a mastectomy is not the best idea at her age.  She would like to think it over and we have set up a telemetry visit to talk further next week.  In the meantime I have sent a message to Dr. Marlou Starks and we will confirm that with him as well.  After the options were discussed we focused on the patient's desires and the procedure that was best for her based on all the information.  A total of 45 minutes of face-to-face time was spent in this encounter, of which >60% was spent in counseling.    Pictures were obtained of the patient and placed in the chart with the patient's or guardian's permission.     [place], DO

## 2020-07-28 ENCOUNTER — Encounter: Payer: Self-pay | Admitting: *Deleted

## 2020-08-02 ENCOUNTER — Other Ambulatory Visit: Payer: Self-pay

## 2020-08-02 ENCOUNTER — Encounter: Payer: Self-pay | Admitting: *Deleted

## 2020-08-02 ENCOUNTER — Telehealth (INDEPENDENT_AMBULATORY_CARE_PROVIDER_SITE_OTHER): Payer: Medicare Other | Admitting: Plastic Surgery

## 2020-08-02 DIAGNOSIS — C50919 Malignant neoplasm of unspecified site of unspecified female breast: Secondary | ICD-10-CM | POA: Diagnosis not present

## 2020-08-02 NOTE — Progress Notes (Signed)
   Subjective:    Patient ID: Donna [name], female    DOB: [birth year], 75 y.o.   MRN: [id number]  The patient is a 75 year old female joining me by phone with her daughter and her son.  She was diagnosed with left breast cancer.  She is trying to decide on whether or not to have reconstruction.  She was diagnosed with carcinosarcoma of the left breast which is estrogen and progesterone negative and HER-2 negative.  It is in the lower portion of the left breast.  The patient has decided on a mastectomy.  She would like to wait on reconstruction.     Review of Systems -virtual visit     Objective:   Physical Exam -virtual visit        Assessment & Plan:     ICD-10-CM   1. Metaplastic carcinoma of breast [facility])  C50.919     Patient will move forward with a left mastectomy.  Once she is completed that she will decide if she wants to have anything done to the contralateral breast such as a lift.  Patient knows that there is no timeout.  I am available at any point if the patient changes her mind.  The only other option would be a partial mastectomy of the left breast.  This would leave her with quite a small breast and the patient is aware.  I will let Dr. Marlou Starks know of the patient's decision. The patient gave consent to have this visit done by telemedicine / virtual visit.  This is also consent for access the chart and treat the patient via this visit. The patient is located at her daughter's house.  I, the provider, am at the office.  We spent 5 minutes together for the visit.  Joined by son and daughter.

## 2020-08-09 ENCOUNTER — Ambulatory Visit: Payer: Self-pay | Admitting: General Surgery

## 2020-08-09 ENCOUNTER — Encounter: Payer: Self-pay | Admitting: *Deleted

## 2020-08-09 DIAGNOSIS — Z171 Estrogen receptor negative status [ER-]: Secondary | ICD-10-CM

## 2020-08-09 DIAGNOSIS — C50212 Malignant neoplasm of upper-inner quadrant of left female breast: Secondary | ICD-10-CM

## 2020-08-09 DIAGNOSIS — C50919 Malignant neoplasm of unspecified site of unspecified female breast: Secondary | ICD-10-CM

## 2020-08-11 ENCOUNTER — Encounter: Payer: Self-pay | Admitting: *Deleted

## 2020-08-19 ENCOUNTER — Telehealth: Payer: Self-pay | Admitting: Hematology and Oncology

## 2020-08-19 NOTE — Telephone Encounter (Signed)
Scheduled appt per 3/10 sch msg. Called pt, no answer. Left vm with appt date and time.

## 2020-08-23 ENCOUNTER — Encounter: Payer: Self-pay | Admitting: *Deleted

## 2020-08-25 ENCOUNTER — Other Ambulatory Visit (HOSPITAL_COMMUNITY)
Admission: RE | Admit: 2020-08-25 | Discharge: 2020-08-25 | Disposition: A | Discharge status: Home / Self Care | Payer: Medicare Other | Source: Ambulatory Visit | Attending: General Surgery | Admitting: General Surgery

## 2020-08-25 DIAGNOSIS — Z01812 Encounter for preprocedural laboratory examination: Secondary | ICD-10-CM | POA: Diagnosis present

## 2020-08-25 DIAGNOSIS — Z20822 Contact with and (suspected) exposure to covid-19: Secondary | ICD-10-CM | POA: Diagnosis not present

## 2020-08-25 LAB — SARS CORONAVIRUS 2 (TAT 6-24 HRS): SARS Coronavirus 2: NEGATIVE

## 2020-08-26 ENCOUNTER — Encounter (HOSPITAL_COMMUNITY): Payer: Self-pay | Admitting: General Surgery

## 2020-08-26 NOTE — Progress Notes (Signed)
Spoke with Daughter Donna [name] for PAT information.  Marland Kitchen  PCP - Montez Morita, PA-C Cardiologist - n/a Oncology - Dr Lurline Del  Chest x-ray - n/a; CT Chest 02/12/20 EKG - n/a Stress Test - n/a ECHO - 02/12/20 Cardiac Cath - n/a  ERAS: Clear liquids til 11:30 am day of surgery.  STOP now taking any Aspirin (unless otherwise instructed by your surgeon), Aleve, Naproxen, Ibuprofen, Motrin, Advil, Goody's, BC's, all herbal medications, fish oil, and all vitamins.   Coronavirus Screening Covid test on 08/25/20 was negative.  Daughter verbalized understanding of instructions that were given via phone.

## 2020-08-29 ENCOUNTER — Encounter (HOSPITAL_COMMUNITY)
Admission: RE | Admit: 2020-08-29 | Discharge: 2020-08-29 | Disposition: A | Discharge status: Home / Self Care | Payer: Medicare Other | Source: Ambulatory Visit | Attending: General Surgery | Admitting: General Surgery

## 2020-08-29 ENCOUNTER — Other Ambulatory Visit: Payer: Self-pay

## 2020-08-29 ENCOUNTER — Encounter (HOSPITAL_COMMUNITY)
Admission: RE | Disposition: A | Discharge status: Home / Self Care | Payer: Self-pay | Source: Home / Self Care | Attending: General Surgery

## 2020-08-29 ENCOUNTER — Ambulatory Visit (HOSPITAL_COMMUNITY): Payer: Medicare Other | Admitting: Anesthesiology

## 2020-08-29 ENCOUNTER — Ambulatory Visit (HOSPITAL_COMMUNITY)
Admission: RE | Admit: 2020-08-29 | Discharge: 2020-08-30 | Disposition: A | Discharge status: Home / Self Care | Payer: Medicare Other | Attending: General Surgery | Admitting: General Surgery

## 2020-08-29 ENCOUNTER — Encounter (HOSPITAL_COMMUNITY): Payer: Self-pay | Admitting: General Surgery

## 2020-08-29 DIAGNOSIS — Z7952 Long term (current) use of systemic steroids: Secondary | ICD-10-CM | POA: Insufficient documentation

## 2020-08-29 DIAGNOSIS — C50912 Malignant neoplasm of unspecified site of left female breast: Secondary | ICD-10-CM | POA: Diagnosis present

## 2020-08-29 DIAGNOSIS — Z79899 Other long term (current) drug therapy: Secondary | ICD-10-CM | POA: Insufficient documentation

## 2020-08-29 DIAGNOSIS — Z9221 Personal history of antineoplastic chemotherapy: Secondary | ICD-10-CM | POA: Insufficient documentation

## 2020-08-29 DIAGNOSIS — Z888 Allergy status to other drugs, medicaments and biological substances status: Secondary | ICD-10-CM | POA: Insufficient documentation

## 2020-08-29 DIAGNOSIS — Z171 Estrogen receptor negative status [ER-]: Secondary | ICD-10-CM | POA: Insufficient documentation

## 2020-08-29 DIAGNOSIS — C50919 Malignant neoplasm of unspecified site of unspecified female breast: Secondary | ICD-10-CM

## 2020-08-29 HISTORY — PX: MASTECTOMY W/ SENTINEL NODE BIOPSY: SHX2001

## 2020-08-29 LAB — CBC
HCT: 45.5 % (ref 36.0–46.0)
Hemoglobin: 15.2 g/dL — ABNORMAL HIGH (ref 12.0–15.0)
MCH: 29.8 pg (ref 26.0–34.0)
MCHC: 33.4 g/dL (ref 30.0–36.0)
MCV: 89.2 fL (ref 80.0–100.0)
Platelets: 269 10*3/uL (ref 150–400)
RBC: 5.1 MIL/uL (ref 3.87–5.11)
RDW: 14 % (ref 11.5–15.5)
WBC: 3.9 10*3/uL — ABNORMAL LOW (ref 4.0–10.5)
nRBC: 0 % (ref 0.0–0.2)

## 2020-08-29 LAB — POCT I-STAT, CHEM 8
BUN: 14 mg/dL (ref 8–23)
Calcium, Ion: 1.4 mmol/L (ref 1.15–1.40)
Chloride: 103 mmol/L (ref 98–111)
Creatinine, Ser: 0.7 mg/dL (ref 0.44–1.00)
Glucose, Bld: 99 mg/dL (ref 70–99)
HCT: 47 % — ABNORMAL HIGH (ref 36.0–46.0)
Hemoglobin: 16 g/dL — ABNORMAL HIGH (ref 12.0–15.0)
Potassium: 3.3 mmol/L — ABNORMAL LOW (ref 3.5–5.1)
Sodium: 141 mmol/L (ref 135–145)
TCO2: 26 mmol/L (ref 22–32)

## 2020-08-29 LAB — BASIC METABOLIC PANEL
Anion gap: 10 (ref 5–15)
BUN: 13 mg/dL (ref 8–23)
CO2: 24 mmol/L (ref 22–32)
Calcium: 11 mg/dL — ABNORMAL HIGH (ref 8.9–10.3)
Chloride: 103 mmol/L (ref 98–111)
Creatinine, Ser: 0.76 mg/dL (ref 0.44–1.00)
GFR, Estimated: >60 mL/min (ref >60)
Glucose, Bld: 98 mg/dL (ref 70–99)
Potassium: 4 mmol/L (ref 3.5–5.1)
Sodium: 137 mmol/L (ref 135–145)

## 2020-08-29 SURGERY — MASTECTOMY WITH SENTINEL LYMPH NODE BIOPSY
Anesthesia: General | Site: Breast | Laterality: Left

## 2020-08-29 MED ORDER — CEFAZOLIN SODIUM-DEXTROSE 2-4 GM/100ML-% IV SOLN
2.0000 g | INTRAVENOUS | Status: AC
  Administered 2020-08-29: 2 g via INTRAVENOUS
  Filled 2020-08-29: qty 100

## 2020-08-29 MED ORDER — CHLORHEXIDINE GLUCONATE CLOTH 2 % EX PADS: 6.0000 | MEDICATED_PAD | Freq: Once | CUTANEOUS | Status: DC

## 2020-08-29 MED ORDER — ORAL CARE MOUTH RINSE: 15.0000 mL | Freq: Once | OROMUCOSAL | Status: AC

## 2020-08-29 MED ORDER — LACTATED RINGERS IV SOLN: INTRAVENOUS | Status: DC

## 2020-08-29 MED ORDER — SODIUM CHLORIDE 0.9 % IV SOLN: INTRAVENOUS | Status: DC

## 2020-08-29 MED ORDER — TECHNETIUM TC 99M TILMANOCEPT KIT
1.0000 | PACK | Freq: Once | INTRAVENOUS | Status: AC | PRN
  Administered 2020-08-29: 1 via INTRADERMAL

## 2020-08-29 MED ORDER — LACTATED RINGERS IV SOLN: INTRAVENOUS | Status: DC | PRN

## 2020-08-29 MED ORDER — CELECOXIB 200 MG PO CAPS
200.0000 mg | ORAL_CAPSULE | ORAL | Status: AC
  Administered 2020-08-29: 200 mg via ORAL
  Filled 2020-08-29: qty 1

## 2020-08-29 MED ORDER — FENTANYL CITRATE (PF) 250 MCG/5ML IJ SOLN
INTRAMUSCULAR | Status: DC | PRN
  Administered 2020-08-29 (×3): 25 ug via INTRAVENOUS

## 2020-08-29 MED ORDER — CHLORHEXIDINE GLUCONATE 0.12 % MT SOLN: 15.0000 mL | Freq: Once | OROMUCOSAL | Status: AC

## 2020-08-29 MED ORDER — HYDROCODONE-ACETAMINOPHEN 5-325 MG PO TABS: 1.0000 | ORAL_TABLET | ORAL | Status: DC | PRN

## 2020-08-29 MED ORDER — FENTANYL CITRATE (PF) 250 MCG/5ML IJ SOLN
INTRAMUSCULAR | Status: AC
  Filled 2020-08-29: qty 5

## 2020-08-29 MED ORDER — CHLORHEXIDINE GLUCONATE 0.12 % MT SOLN
OROMUCOSAL | Status: AC
  Administered 2020-08-29: 15 mL via OROMUCOSAL
  Filled 2020-08-29: qty 15

## 2020-08-29 MED ORDER — LOSARTAN POTASSIUM 50 MG PO TABS: 50.0000 mg | ORAL_TABLET | Freq: Every day | ORAL | Status: DC

## 2020-08-29 MED ORDER — MIDAZOLAM HCL 2 MG/2ML IJ SOLN: 1.0000 mg | Freq: Once | INTRAMUSCULAR | Status: AC

## 2020-08-29 MED ORDER — PANTOPRAZOLE SODIUM 40 MG IV SOLR
40.0000 mg | Freq: Every day | INTRAVENOUS | Status: DC
  Administered 2020-08-29: 40 mg via INTRAVENOUS
  Filled 2020-08-29: qty 40

## 2020-08-29 MED ORDER — MIDAZOLAM HCL 2 MG/2ML IJ SOLN
INTRAMUSCULAR | Status: AC
  Administered 2020-08-29: 1 mg via INTRAVENOUS
  Filled 2020-08-29: qty 2

## 2020-08-29 MED ORDER — AMLODIPINE BESYLATE 10 MG PO TABS: 10.0000 mg | ORAL_TABLET | Freq: Every day | ORAL | Status: DC

## 2020-08-29 MED ORDER — ACETAMINOPHEN 500 MG PO TABS
1000.0000 mg | ORAL_TABLET | ORAL | Status: AC
  Administered 2020-08-29: 1000 mg via ORAL
  Filled 2020-08-29: qty 2

## 2020-08-29 MED ORDER — FENTANYL CITRATE (PF) 100 MCG/2ML IJ SOLN
INTRAMUSCULAR | Status: AC
  Administered 2020-08-29: 50 ug via INTRAVENOUS
  Filled 2020-08-29: qty 2

## 2020-08-29 MED ORDER — LOSARTAN POTASSIUM 50 MG PO TABS
50.0000 mg | ORAL_TABLET | Freq: Every day | ORAL | Status: DC
  Administered 2020-08-30: 50 mg via ORAL
  Filled 2020-08-29: qty 1

## 2020-08-29 MED ORDER — ONDANSETRON HCL 4 MG/2ML IJ SOLN
INTRAMUSCULAR | Status: DC | PRN
  Administered 2020-08-29: 4 mg via INTRAVENOUS

## 2020-08-29 MED ORDER — HEPARIN SODIUM (PORCINE) 5000 UNIT/ML IJ SOLN
5000.0000 [IU] | Freq: Three times a day (TID) | INTRAMUSCULAR | Status: DC
  Administered 2020-08-30: 5000 [IU] via SUBCUTANEOUS
  Filled 2020-08-29: qty 1

## 2020-08-29 MED ORDER — PHENYLEPHRINE HCL-NACL 10-0.9 MG/250ML-% IV SOLN
INTRAVENOUS | Status: DC | PRN
  Administered 2020-08-29: 20 ug/min via INTRAVENOUS

## 2020-08-29 MED ORDER — 0.9 % SODIUM CHLORIDE (POUR BTL) OPTIME
TOPICAL | Status: DC | PRN
  Administered 2020-08-29: 1000 mL

## 2020-08-29 MED ORDER — MORPHINE SULFATE (PF) 2 MG/ML IV SOLN: 1.0000 mg | INTRAVENOUS | Status: DC | PRN

## 2020-08-29 MED ORDER — LIDOCAINE 2% (20 MG/ML) 5 ML SYRINGE
INTRAMUSCULAR | Status: DC | PRN
  Administered 2020-08-29: 40 mg via INTRAVENOUS

## 2020-08-29 MED ORDER — GABAPENTIN 300 MG PO CAPS
300.0000 mg | ORAL_CAPSULE | ORAL | Status: AC
  Administered 2020-08-29: 300 mg via ORAL
  Filled 2020-08-29: qty 1

## 2020-08-29 MED ORDER — PHENYLEPHRINE 40 MCG/ML (10ML) SYRINGE FOR IV PUSH (FOR BLOOD PRESSURE SUPPORT)
PREFILLED_SYRINGE | INTRAVENOUS | Status: DC | PRN
  Administered 2020-08-29 (×3): 80 ug via INTRAVENOUS
  Administered 2020-08-29 (×2): 120 ug via INTRAVENOUS
  Administered 2020-08-29 (×2): 80 ug via INTRAVENOUS

## 2020-08-29 MED ORDER — PROPOFOL 10 MG/ML IV BOLUS
INTRAVENOUS | Status: DC | PRN
  Administered 2020-08-29: 140 mg via INTRAVENOUS

## 2020-08-29 MED ORDER — PROPOFOL 10 MG/ML IV BOLUS
INTRAVENOUS | Status: AC
  Filled 2020-08-29: qty 40

## 2020-08-29 MED ORDER — PROPOFOL 500 MG/50ML IV EMUL
INTRAVENOUS | Status: DC | PRN
  Administered 2020-08-29: 20 ug/kg/min via INTRAVENOUS

## 2020-08-29 MED ORDER — ONDANSETRON 4 MG PO TBDP: 4.0000 mg | ORAL_TABLET | Freq: Four times a day (QID) | ORAL | Status: DC | PRN

## 2020-08-29 MED ORDER — ROCURONIUM BROMIDE 10 MG/ML (PF) SYRINGE
PREFILLED_SYRINGE | INTRAVENOUS | Status: AC
  Filled 2020-08-29: qty 10

## 2020-08-29 MED ORDER — CHLORTHALIDONE 25 MG PO TABS
25.0000 mg | ORAL_TABLET | Freq: Every day | ORAL | Status: DC
  Administered 2020-08-30: 25 mg via ORAL
  Filled 2020-08-29: qty 1

## 2020-08-29 MED ORDER — LIDOCAINE 2% (20 MG/ML) 5 ML SYRINGE
INTRAMUSCULAR | Status: AC
  Filled 2020-08-29: qty 10

## 2020-08-29 MED ORDER — CHLORTHALIDONE 25 MG PO TABS
25.0000 mg | ORAL_TABLET | Freq: Every day | ORAL | Status: DC
Start: 2020-08-29 — End: 2020-08-29
  Filled 2020-08-29: qty 1

## 2020-08-29 MED ORDER — METHOCARBAMOL 500 MG PO TABS: 500.0000 mg | ORAL_TABLET | Freq: Four times a day (QID) | ORAL | Status: DC | PRN

## 2020-08-29 MED ORDER — METHYLENE BLUE 0.5 % INJ SOLN
INTRAVENOUS | Status: AC
  Filled 2020-08-29: qty 10

## 2020-08-29 MED ORDER — PHENYLEPHRINE 40 MCG/ML (10ML) SYRINGE FOR IV PUSH (FOR BLOOD PRESSURE SUPPORT)
PREFILLED_SYRINGE | INTRAVENOUS | Status: AC
  Filled 2020-08-29: qty 10

## 2020-08-29 MED ORDER — EPHEDRINE SULFATE-NACL 50-0.9 MG/10ML-% IV SOSY
PREFILLED_SYRINGE | INTRAVENOUS | Status: DC | PRN
  Administered 2020-08-29: 5 mg via INTRAVENOUS
  Administered 2020-08-29: 10 mg via INTRAVENOUS

## 2020-08-29 MED ORDER — ONDANSETRON HCL 4 MG/2ML IJ SOLN: 4.0000 mg | Freq: Four times a day (QID) | INTRAMUSCULAR | Status: DC | PRN

## 2020-08-29 MED ORDER — DEXAMETHASONE SODIUM PHOSPHATE 10 MG/ML IJ SOLN
INTRAMUSCULAR | Status: DC | PRN
  Administered 2020-08-29: 10 mg via INTRAVENOUS

## 2020-08-29 MED ORDER — FENTANYL CITRATE (PF) 100 MCG/2ML IJ SOLN: 50.0000 ug | Freq: Once | INTRAMUSCULAR | Status: AC

## 2020-08-29 SURGICAL SUPPLY — 57 items
APPLIER CLIP 9.375 MED OPEN (MISCELLANEOUS) ×3
BINDER BREAST LRG (GAUZE/BANDAGES/DRESSINGS) ×3 IMPLANT
BINDER BREAST XLRG (GAUZE/BANDAGES/DRESSINGS) IMPLANT
BIOPATCH RED 1 DISK 7.0 (GAUZE/BANDAGES/DRESSINGS) ×2 IMPLANT
BIOPATCH RED 1IN DISK 7.0MM (GAUZE/BANDAGES/DRESSINGS) ×1
BIOPATCH WHT 1IN DISK W/4.0 H (GAUZE/BANDAGES/DRESSINGS) ×3 IMPLANT
CANISTER SUCT 3000ML PPV (MISCELLANEOUS) ×3 IMPLANT
CHLORAPREP W/TINT 26 (MISCELLANEOUS) ×3 IMPLANT
CLIP APPLIE 9.375 MED OPEN (MISCELLANEOUS) ×1 IMPLANT
CNTNR URN SCR LID CUP LEK RST (MISCELLANEOUS) ×1 IMPLANT
CONT SPEC 4OZ STRL OR WHT (MISCELLANEOUS) ×2
COVER PROBE W GEL 5X96 (DRAPES) ×3 IMPLANT
COVER SURGICAL LIGHT HANDLE (MISCELLANEOUS) ×3 IMPLANT
COVER WAND RF STERILE (DRAPES) ×3 IMPLANT
DERMABOND ADHESIVE PROPEN (GAUZE/BANDAGES/DRESSINGS) ×2
DERMABOND ADVANCED (GAUZE/BANDAGES/DRESSINGS) ×2
DERMABOND ADVANCED .7 DNX12 (GAUZE/BANDAGES/DRESSINGS) ×1 IMPLANT
DERMABOND ADVANCED .7 DNX6 (GAUZE/BANDAGES/DRESSINGS) ×1 IMPLANT
DEVICE DSSCT PLSMBLD 3.0S LGHT (MISCELLANEOUS) ×1 IMPLANT
DRAIN CHANNEL 19F RND (DRAIN) ×3 IMPLANT
DRAPE CHEST BREAST 15X10 FENES (DRAPES) ×3 IMPLANT
DRSG PAD ABDOMINAL 8X10 ST (GAUZE/BANDAGES/DRESSINGS) ×3 IMPLANT
DRSG TEGADERM 4X4.75 (GAUZE/BANDAGES/DRESSINGS) ×3 IMPLANT
ELECT CAUTERY BLADE 6.4 (BLADE) ×3 IMPLANT
ELECT REM PT RETURN 9FT ADLT (ELECTROSURGICAL) ×3
ELECTRODE REM PT RTRN 9FT ADLT (ELECTROSURGICAL) ×1 IMPLANT
EVACUATOR SILICONE 100CC (DRAIN) ×3 IMPLANT
FILTER IN LINE W/DETACHED HOSE (FILTER) ×3 IMPLANT
GAUZE SPONGE 4X4 12PLY STRL (GAUZE/BANDAGES/DRESSINGS) ×3 IMPLANT
GLOVE BIO SURGEON STRL SZ7.5 (GLOVE) ×3 IMPLANT
GOWN STRL REUS W/ TWL LRG LVL3 (GOWN DISPOSABLE) ×2 IMPLANT
GOWN STRL REUS W/TWL LRG LVL3 (GOWN DISPOSABLE) ×4
KIT BASIN OR (CUSTOM PROCEDURE TRAY) ×3 IMPLANT
KIT TURNOVER KIT B (KITS) ×3 IMPLANT
LIGHT WAVEGUIDE WIDE FLAT (MISCELLANEOUS) IMPLANT
NEEDLE 18GX1X1/2 (RX/OR ONLY) (NEEDLE) IMPLANT
NEEDLE FILTER BLUNT 18X 1/2SAF (NEEDLE)
NEEDLE FILTER BLUNT 18X1 1/2 (NEEDLE) IMPLANT
NEEDLE HYPO 25GX1X1/2 BEV (NEEDLE) IMPLANT
NS IRRIG 1000ML POUR BTL (IV SOLUTION) ×3 IMPLANT
PACK GENERAL/GYN (CUSTOM PROCEDURE TRAY) ×3 IMPLANT
PAD ARMBOARD 7.5X6 YLW CONV (MISCELLANEOUS) ×3 IMPLANT
PENCIL SMOKE EVACUATOR (MISCELLANEOUS) ×3 IMPLANT
PLASMABLADE 3.0S W/LIGHT (MISCELLANEOUS) ×3
SPECIMEN JAR X LARGE (MISCELLANEOUS) ×3 IMPLANT
SUT ETHILON 3 0 FSL (SUTURE) ×3 IMPLANT
SUT MNCRL AB 4-0 PS2 18 (SUTURE) ×3 IMPLANT
SUT VIC AB 0 CT1 27 (SUTURE) ×4
SUT VIC AB 0 CT1 27XBRD ANBCTR (SUTURE) ×2 IMPLANT
SUT VIC AB 3-0 54X BRD REEL (SUTURE) IMPLANT
SUT VIC AB 3-0 BRD 54 (SUTURE)
SUT VIC AB 3-0 SH 18 (SUTURE) ×3 IMPLANT
SYR CONTROL 10ML LL (SYRINGE) IMPLANT
TOWEL GREEN STERILE (TOWEL DISPOSABLE) ×3 IMPLANT
TOWEL GREEN STERILE FF (TOWEL DISPOSABLE) ×3 IMPLANT
TUBE CONNECTING 12'X1/4 (SUCTIONS)
TUBE CONNECTING 12X1/4 (SUCTIONS) IMPLANT

## 2020-08-29 NOTE — Progress Notes (Signed)
Dr. Linna Caprice aware of patient's heart rate. Dr. Gifford Shave at bedside.  Patient denies chest pain and light headedness.

## 2020-08-29 NOTE — H&P (Signed)
Donna [name]  Location: [facility] Patient #: [id number] DOB: 1945-08-29 Divorced / Language: English / Race: Black or African American Female   History of Present Illness  The patient is a 75 year old female who presents for a follow-up for Breast cancer. 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 She was treated with neoadjuvant chemotherapy and had some mild shrinkage of the tumor. Her lymph nodes all looked normal. At this point she is ready to schedule her definitive surgery. 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   Allergies Benazepril HCl *ANTIHYPERTENSIVES*  Allergies Reconciled   Medication History  amLODIPine Besylate (10MG Tablet, Oral) Active. Losartan Potassium (50MG Tablet, Oral) Active. Prochlorperazine Maleate (10MG Tablet, Oral) Active. Chlorthalidone (25MG Tablet, Oral) Active. Dexamethasone (4MG Tablet, Oral) Active. Medications Reconciled    Review of Systems General Not Present- Appetite Loss, Chills, Fatigue, Fever, Night Sweats, Weight Gain and Weight Loss. Skin Not Present- Change in Wart/Mole, Dryness, Hives, Jaundice, New Lesions, Non-Healing Wounds, Rash and Ulcer. HEENT Present- Wears glasses/contact lenses. Not Present- Earache, Hearing Loss, Hoarseness, Nose Bleed, Oral Ulcers, Ringing in the Ears, Seasonal Allergies, Sinus Pain, Sore Throat, Visual Disturbances and Yellow Eyes. Respiratory Not Present- Bloody sputum, Chronic Cough, Difficulty Breathing, Snoring and Wheezing. Breast Present- Breast Mass. Not Present- Breast Pain, Nipple Discharge and Skin Changes. Cardiovascular Not Present- Chest Pain, Difficulty Breathing Lying Down, Leg Cramps, Palpitations, Rapid Heart Rate, Shortness of Breath and Swelling of  Extremities. Gastrointestinal Not Present- Abdominal Pain, Bloating, Bloody Stool, Change in Bowel Habits, Chronic diarrhea, Constipation, Difficulty Swallowing, Excessive gas, Gets full quickly at meals, Hemorrhoids, Indigestion, Nausea, Rectal Pain and Vomiting. Female Genitourinary Not Present- Frequency, Nocturia, Painful Urination, Pelvic Pain and Urgency. Musculoskeletal Not Present- Back Pain, Joint Pain, Joint Stiffness, Muscle Pain, Muscle Weakness and Swelling of Extremities. Neurological Not Present- Decreased Memory, Fainting, Headaches, Numbness, Seizures, Tingling, Tremor, Trouble walking and Weakness. Psychiatric Not Present- Anxiety, Bipolar, Change in Sleep Pattern, Depression, Fearful and Frequent crying. Endocrine Not Present- Cold Intolerance, Excessive Hunger, Hair Changes, Heat Intolerance, Hot flashes and New Diabetes. Hematology Not Present- Blood Thinners, Easy Bruising, Excessive bleeding, Gland problems, HIV and Persistent Infections.   Physical Exam General Mental Status-Alert. General Appearance-Consistent with stated age. Hydration-Well hydrated. Voice-Normal.  Head and Neck Head-normocephalic, atraumatic with no lesions or palpable masses. Trachea-midline. Thyroid Gland Characteristics - normal size and consistency.  Eye Eyeball - Bilateral-Extraocular movements intact. Sclera/Conjunctiva - Bilateral-No scleral icterus.  Chest and Lung Exam Chest and lung exam reveals -quiet, even and easy respiratory effort with no use of accessory muscles and on auscultation, normal breath sounds, no adventitious sounds and normal vocal resonance. Inspection Chest Wall - Normal. Back - normal.  Breast Note: there is a 4 cm palpable mass in the inner aspect of the left breast that you can feel both superiorly and inferiorly on the breast. It appears to be mobile and not tethered to the chest wall. It is very close to the skin inferiorly. There is  no palpable mass of the right breast. There is no palpable axillary, supraclavicular, or cervical lymphadenopathy.   Cardiovascular Cardiovascular examination reveals -normal heart sounds, regular rate and rhythm with no murmurs and normal pedal pulses bilaterally. Note: the heart rate is regular but seems to have a repeatable pause after every other beat  Abdomen Inspection Inspection of the abdomen reveals - No Hernias. Skin - Scar - no surgical scars. Palpation/Percussion Palpation and Percussion of the abdomen reveal - Soft, Non Tender, No Rebound tenderness, No Rigidity (guarding) and No hepatosplenomegaly. Auscultation Auscultation of the abdomen reveals - Bowel sounds normal.  Neurologic Neurologic evaluation reveals -alert and oriented x 3 with no impairment of recent or remote memory. Mental Status-Normal.  Musculoskeletal Normal Exam - Left-Upper Extremity Strength Normal and Lower Extremity Strength Normal. Normal Exam - Right-Upper Extremity Strength Normal and Lower Extremity Strength Normal.  Lymphatic Head & Neck  General Head & Neck Lymphatics: Bilateral - Description - Normal. Axillary  General Axillary Region: Bilateral - Description - Normal. Tenderness - Non Tender. Femoral & Inguinal  Generalized Femoral & Inguinal Lymphatics: Bilateral - Description - Normal. Tenderness - Non Tender.    Assessment & Plan MALIGNANT NEOPLASM OF LOWER-INNER QUADRANT OF LEFT BREAST IN FEMALE, ESTROGEN RECEPTOR NEGATIVE (C50.312) Impression: The patient has a 4 cm met up plastic breast cancer in the lower portion of the left breast. She has finished neoadjuvant chemotherapy and is ready for definitive surgery. At this point given the large size I think that the options for surgical treatment at this point would include either mastectomy versus a combined reduction lumpectomy with plastic surgery. She would also be a candidate for sentinel node biopsy. I have  discussed with her in detail the risks and benefits of the operations as well as some of the technical aspects and she understands. I will go ahead and refer to plastic surgery to talk about reconstructive and reduction options and then we can proceed with surgical planning once she makes her final decision. She is also having some back pain which she will discuss with her medical oncologist. This patient encounter took 30 minutes today to perform the following: take history, perform exam, review outside records, interpret imaging, counsel the patient on their diagnosis and document encounter, findings & plan in the EHR Current Plans Referred to Surgery - Plastic, for evaluation and follow up (Plastic Surgery). Routine.

## 2020-08-29 NOTE — Transfer of Care (Signed)
Immediate Anesthesia Transfer of Care Note  Patient: Donna [name]  Procedure(s) Performed: LEFT MASTECTOMY WITH SENTINEL LYMPH NODE BIOPSY (Left Breast)  Patient Location: PACU  Anesthesia Type:General  Level of Consciousness: awake, alert  and oriented  Airway & Oxygen Therapy: Patient Spontanous Breathing  Post-op Assessment: Report given to RN and Post -op Vital signs reviewed and stable  Post vital signs: Reviewed and stable  Last Vitals:  Vitals Value Taken Time  BP 115/61 08/29/20 1800  Temp    Pulse 31 08/29/20 1801  Resp 12 08/29/20 1801  SpO2 96 % 08/29/20 1801  Vitals shown include unvalidated device data.  Last Pain:  Vitals:   08/29/20 1431  TempSrc:   PainSc: 0-No pain         Complications: No complications documented.

## 2020-08-29 NOTE — Interval H&P Note (Signed)
History and Physical Interval Note:  08/29/2020 3:51 PM  Donna [name]  has presented today for surgery, with the diagnosis of LEFT BREAST CANCER.  The various methods of treatment have been discussed with the patient and family. After consideration of risks, benefits and other options for treatment, the patient has consented to  Procedure(s) with comments: LEFT MASTECTOMY WITH SENTINEL LYMPH NODE BIOPSY (Left) - RNFA, PEC BLOCK as a surgical intervention.  The patient's history has been reviewed, patient examined, no change in status, stable for surgery.  I have reviewed the patient's chart and labs.  Questions were answered to the patient's satisfaction.     [name]

## 2020-08-29 NOTE — Anesthesia Preprocedure Evaluation (Addendum)
Anesthesia Evaluation  Patient identified by MRN, date of birth, ID band Patient awake    Reviewed: Allergy & Precautions, NPO status , Patient's Chart, lab work & pertinent test results  Airway Mallampati: II  TM Distance: >3 FB Neck ROM: Full    Dental  (+) Partial Upper, Dental Advisory Given   Pulmonary    breath sounds clear to auscultation       Cardiovascular hypertension,  Rhythm:Regular Rate:Normal     Neuro/Psych    GI/Hepatic   Endo/Other    Renal/GU      Musculoskeletal   Abdominal (+) - obese,   Peds  Hematology   Anesthesia Other Findings   Reproductive/Obstetrics                             Anesthesia Physical Anesthesia Plan  ASA: III  Anesthesia Plan: General   Post-op Pain Management:  Regional for Post-op pain   Induction: Intravenous  PONV Risk Score and Plan: Ondansetron and Dexamethasone  Airway Management Planned: LMA  Additional Equipment:   Intra-op Plan:   Post-operative Plan:   Informed Consent: I have reviewed the patients History and Physical, chart, labs and discussed the procedure including the risks, benefits and alternatives for the proposed anesthesia with the patient or authorized representative who has indicated his/her understanding and acceptance.     Dental advisory given  Plan Discussed with: CRNA and Anesthesiologist  Anesthesia Plan Comments: (Plan GA with LMA and pectoralis block)        Anesthesia Quick Evaluation

## 2020-08-29 NOTE — Op Note (Signed)
08/29/2020  4:17 PM  PATIENT:  Donna [name]  75 y.o. female  PRE-OPERATIVE DIAGNOSIS:  LEFT BREAST CANCER  POST-OPERATIVE DIAGNOSIS:  LEFT BREAST CANCER  PROCEDURE:  Procedure(s) with comments: LEFT MASTECTOMY WITH DEEP LEFT AXILLARY SENTINEL LYMPH NODE BIOPSY (Left) - RNFA, PEC BLOCK  SURGEON:  Surgeon(s) and Role:    * [name], MD - Primary  PHYSICIAN ASSISTANT:   ASSISTANTS: none   ANESTHESIA:   general  EBL:  minimal   BLOOD ADMINISTERED:none  DRAINS: (1) Jackson-Pratt drain(s) with closed bulb suction in the prepectoral space   LOCAL MEDICATIONS USED:  NONE  SPECIMEN:  Source of Specimen:  left mastectomy and sentinel node  DISPOSITION OF SPECIMEN:  PATHOLOGY  COUNTS:  YES  TOURNIQUET:  * No tourniquets in log *  DICTATION: .Dragon Dictation   After informed consent was obtained the patient was brought to the operating room and placed in the supine position on the operating table.  After adequate induction of general anesthesia the patient's left chest, breast, and axillary area were prepped with ChloraPrep, allowed to dry, and draped in usual sterile manner.  An appropriate timeout was performed.  Earlier in the day the patient underwent injection of 1 mCi of technetium sulfur colloid in the subareolar position on the left breast.  The neoprobe was set to technetium and there was a good signal in the left axilla.  An elliptical incision was made around the nipple areola complex in order to minimize the excess skin and to get around the palpable mass that appeared to be involving the lower flap of the breast.  The incision was carried through the skin and subcutaneous tissue sharply with the PlasmaBlade.  Breast hooks were used to elevate the skin flaps anteriorly towards the ceiling.  Thin skin flaps were then created by dissecting between the breast tissue and the subcutaneous fat and skin.  This dissection was carried all the way to the chest wall  circumferentially.  Next the breast was removed from the pectoralis muscle with the pectoralis fascia.  Once this was accomplished the entire left breast was removed from the patient.  It was marked with a stitch on the lateral skin and sent to pathology for further evaluation.  The neoprobe was then used to direct blunt hemostat dissection into the deep left axillary space.  I was able to identify a hot lymph node.  This was excised sharply with the PlasmaBlade and the surrounding small vessels and lymphatics were controlled with clips.  Ex vivo counts on this node were approximately 300.  No other hot or palpable nodes were identified in the left axilla.  Hemostasis was achieved using the PlasmaBlade.  The wound was irrigated with copious amounts of saline.  A small stab incision was made near the anterior axillary line inferior to the operative bed with 15 blade knife.  A tonsil clamp was placed through this opening and used to bring a 19 [place] round Blake drain into the operative bed.  The drain was curled along the chest wall.  The drain was anchored to the skin with a 3-0 nylon stitch.  Next the superior and inferior skin flaps were grossly reapproximated with interrupted 3-0 Vicryl stitches.  The skin was then closed with a running 4-0 Monocryl subcuticular stitch.  Dermabond dressings were applied.  The drain was placed to bulb suction and there was a good seal.  The skin flaps were healthy.  The patient tolerated the procedure well.  All needle  sponge and instrument counts were correct.  The patient was then awakened and taken to recovery in stable condition.  PLAN OF CARE: Admit for overnight observation  PATIENT DISPOSITION:  PACU - hemodynamically stable.   Delay start of Pharmacological VTE agent (>24hrs) due to surgical blood loss or risk of bleeding: no

## 2020-08-29 NOTE — Anesthesia Procedure Notes (Signed)
Procedure Name: LMA Insertion Date/Time: 08/29/2020 4:26 PM Performed by: Valda Favia, CRNA Pre-anesthesia Checklist: Patient identified, Emergency Drugs available, Suction available and Patient being monitored Patient Re-evaluated:Patient Re-evaluated prior to induction Oxygen Delivery Method: Circle System Utilized Preoxygenation: Pre-oxygenation with 100% oxygen Induction Type: IV induction Ventilation: Mask ventilation without difficulty LMA: LMA inserted LMA Size: 4.0 Number of attempts: 1 Airway Equipment and Method: Bite block Placement Confirmation: positive ETCO2 Tube secured with: Tape Dental Injury: Teeth and Oropharynx as per pre-operative assessment

## 2020-08-29 NOTE — Anesthesia Procedure Notes (Signed)
Anesthesia Regional Block: Pectoralis block   Pre-Anesthetic Checklist: ,, timeout performed, Correct Patient, Correct Site, Correct Laterality, Correct Procedure, Correct Position, site marked, Risks and benefits discussed,  Surgical consent,  Pre-op evaluation,  At surgeon's request and post-op pain management  Laterality: Left  Prep: chloraprep       Needles:  Injection technique: Single-shot  Needle Type: Echogenic Needle     Needle Length: 9cm  Needle Gauge: 21     Additional Needles:   Procedures:,,,, ultrasound used (permanent image in chart),,,,  Narrative:  Start time: 08/29/2020 2:20 PM End time: 08/29/2020 2:30 PM Injection made incrementally with aspirations every 5 mL.  Performed by: Personally  Anesthesiologist: Roberts Gaudy, MD  Additional Notes: 30 cc 0.25% Bupivacaine with 1:200 Epi injected esily

## 2020-08-30 ENCOUNTER — Encounter (HOSPITAL_COMMUNITY): Payer: Self-pay | Admitting: General Surgery

## 2020-08-30 DIAGNOSIS — C50912 Malignant neoplasm of unspecified site of left female breast: Secondary | ICD-10-CM | POA: Diagnosis not present

## 2020-08-30 MED ORDER — METHOCARBAMOL 500 MG PO TABS: 500.0000 mg | ORAL_TABLET | Freq: Four times a day (QID) | ORAL | 1 refills | Status: DC | PRN

## 2020-08-30 MED ORDER — HYDROCODONE-ACETAMINOPHEN 5-325 MG PO TABS: 1.0000 | ORAL_TABLET | Freq: Four times a day (QID) | ORAL | 0 refills | Status: DC | PRN

## 2020-08-30 NOTE — Progress Notes (Signed)
1 Day Post-Op   Subjective/Chief Complaint: No complaints   Objective: Vital signs in last 24 hours: Temp:  [97.5 F (36.4 C)-98.3 F (36.8 C)] 98.1 F (36.7 C) (03/22 0548) Pulse Rate:  [44-115] 81 (03/22 0548) Resp:  [13-20] 17 (03/22 0548) BP: (111-139)/(61-78) 111/65 (03/22 0548) SpO2:  [96 %-100 %] 99 % (03/22 0548) Weight:  [68 kg] 68 kg (03/21 1214)    Intake/Output from previous day: 03/21 0701 - 03/22 0700 In: 1557.5 [P.O.:480; I.V.:1077.5] Out: 60 [Drains:55; Blood:5] Intake/Output this shift: No intake/output data recorded.  General appearance: alert and cooperative Resp: clear to auscultation bilaterally Chest wall: skin flaps look good Cardio: regular rate and rhythm GI: soft, non-tender; bowel sounds normal; no masses,  no organomegaly  Lab Results:  Recent Labs    08/29/20 1250 08/29/20 1409  WBC 3.9*  --   HGB 15.2* 16.0*  HCT 45.5 47.0*  PLT 269  --    BMET Recent Labs    08/29/20 1250 08/29/20 1409  NA 137 141  K 4.0 3.3*  CL 103 103  CO2 24  --   GLUCOSE 98 99  BUN 13 14  CREATININE 0.76 0.70  CALCIUM 11.0*  --    PT/INR No results for input(s): LABPROT, INR in the last 72 hours. ABG No results for input(s): PHART, HCO3 in the last 72 hours.  Invalid input(s): PCO2, PO2  Studies/Results: NM Sentinel Node Inj-No Rpt (Breast)  Result Date: 08/29/2020 Sulfur colloid was injected by the nuclear medicine technologist for melanoma sentinel node.    Anti-infectives: Anti-infectives (From admission, onward)   Start     Dose/Rate Route Frequency Ordered Stop   08/29/20 1245  ceFAZolin (ANCEF) IVPB 2g/100 mL premix        2 g 200 mL/hr over 30 Minutes Intravenous On call to O.R. 08/29/20 1235 08/29/20 1631      Assessment/Plan: s/p Procedure(s) with comments: LEFT MASTECTOMY WITH SENTINEL LYMPH NODE BIOPSY (Left) - RNFA, Dexter Advance diet Discharge  LOS: 0 days    [name] 08/30/2020

## 2020-08-30 NOTE — Anesthesia Postprocedure Evaluation (Signed)
Anesthesia Post Note  Patient: Donna [name]  Procedure(s) Performed: LEFT MASTECTOMY WITH SENTINEL LYMPH NODE BIOPSY (Left Breast)     Patient location during evaluation: PACU Anesthesia Type: General Level of consciousness: awake and alert Pain management: pain level controlled Vital Signs Assessment: post-procedure vital signs reviewed and stable Respiratory status: spontaneous breathing, nonlabored ventilation, respiratory function stable and patient connected to nasal cannula oxygen Cardiovascular status: blood pressure returned to baseline and stable Postop Assessment: no apparent nausea or vomiting Anesthetic complications: no   No complications documented.  Last Vitals:  Vitals:   08/30/20 0132 08/30/20 0548  BP: 113/68 111/65  Pulse: 80 81  Resp: 16 17  Temp: 36.5 C 36.7 C  SpO2: 98% 99%    Last Pain:  Vitals:   08/30/20 0548  TempSrc: Oral  PainSc:                  Catalina Gravel

## 2020-08-30 NOTE — Progress Notes (Signed)
Gave pt and family discharge instruction. Re-educated the patient and family on the JP drain and how to record output. Daughter is concern about the bleeding around the drain; informed them that is okay, keep and eye on it. Call MD if you have any concerns. IV discontinue and intact, placed gauze. Gave breast cancer bag with gauze, specimen cup and log sheet.

## 2020-09-01 ENCOUNTER — Encounter: Payer: Self-pay | Admitting: *Deleted

## 2020-09-02 ENCOUNTER — Telehealth: Payer: Self-pay

## 2020-09-02 NOTE — Telephone Encounter (Signed)
Donna [name] called stating appt for 4/4 needs to be r/s d/t other appt times. Message sent to scheduling for this to be accommodated. Labrecka verbalizes thanks and understanding.

## 2020-09-05 ENCOUNTER — Telehealth: Payer: Self-pay | Admitting: Oncology

## 2020-09-05 NOTE — Telephone Encounter (Signed)
R/s appt per 3/25 sch msg. Pt's daughter is aware.

## 2020-09-07 LAB — SURGICAL PATHOLOGY

## 2020-09-08 ENCOUNTER — Other Ambulatory Visit: Payer: Self-pay | Admitting: *Deleted

## 2020-09-08 DIAGNOSIS — C50919 Malignant neoplasm of unspecified site of unspecified female breast: Secondary | ICD-10-CM

## 2020-09-12 ENCOUNTER — Inpatient Hospital Stay: Payer: Medicare Other | Admitting: Oncology

## 2020-09-12 ENCOUNTER — Encounter: Payer: Self-pay | Admitting: *Deleted

## 2020-09-13 ENCOUNTER — Ambulatory Visit: Payer: Medicare Other | Admitting: Hematology and Oncology

## 2020-09-13 ENCOUNTER — Encounter: Payer: Self-pay | Admitting: Plastic Surgery

## 2020-09-13 DIAGNOSIS — Z719 Counseling, unspecified: Secondary | ICD-10-CM

## 2020-09-23 NOTE — Progress Notes (Incomplete)
Location of Breast Cancer: Malignant neoplasm of upper-inner quadrant of LEFT breast, estrogen receptor negative  Histology per Pathology Report:  08/29/2020 FINAL MICROSCOPIC DIAGNOSIS:  A. 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  - Carcinoma is <1 mm from the inferior margin broadly.  - Treatment effect present.  - See oncology table.  B. LYMPH NODE, LEFT #1, SENTINEL, EXCISION:  - One of one lymph nodes negative for carcinoma (0/1).  C. LYMPH NODE, LEFT, SENTINEL, EXCISION:  - One of one lymph nodes negative for carcinoma (0/1).  ONCOLOGY TABLE:  INVASIVE CARCINOMA OF THE BREAST, STATUS POST NEOADJUVANT TREATMENT: Resection   Receptor Status: ER(0%), PR (5%), Her2-neu (Negative via FISH)  Did patient present with symptoms (if so, please note symptoms) or was this found on screening mammography?:  Patient palpated a mass in her left breast during showering.  She brought this to medical attention and on 12/16/2019 underwent mammography, the results of which I do not have today.  She proceeded to left breast ultrasound showing breast density B.  There was a mass in the upper inner quadrant of the left breast at the 10 o'clock position 7 cm from the nipple.  It measured 3.26 cm.  There are no ultrasound comments regarding the axilla  Past/Anticipated interventions by surgeon, if any: 08/29/2020 Dr. Autumn Messing LEFT MASTECTOMY WITH DEEP LEFT AXILLARY SENTINEL LYMPH NODE BIOPSY  Past/Anticipated interventions by medical oncology, if any: Under care of Dr. Sarajane Jews Magrinat 07/12/2020 (1) neoadjuvant chemotherapy with doxorubicin and cyclophosphamide in dose dense fashion x4 started 02/23/2020, followed by weekly paclitaxel and carboplatin x12 starting 04/19/2020             (a) echo 02/12/2020 shows an ejection fraction in the 60-65% range (2) definitive surgery to follow (3) adjuvant radiation as appropriate --Tentatively I have made her a virtual  appointment with me on July 19, 2020 in case they have questions but if not then I will see her again after surgery in mid March and take it from there --They understand that I can still feel the mass in the left breast and therefore I do not anticipate a complete pathologic response from chemo.  I am hoping that the chemo has sterilized any cancer outside the breast which is of course what is life-threatening in this case  Lymphedema issues, if any:  ***    Pain issues, if any:  ***   SAFETY ISSUES:  Prior radiation? ***  Pacemaker/ICD? ***  Possible current pregnancy? No--postmenopausal   Is the patient on methotrexate? ***  Current Complaints / other details:  ***

## 2020-09-26 ENCOUNTER — Encounter: Payer: Self-pay | Admitting: Radiation Oncology

## 2020-09-26 ENCOUNTER — Ambulatory Visit
Admission: RE | Admit: 2020-09-26 | Discharge: 2020-09-26 | Disposition: A | Discharge status: Home / Self Care | Payer: Medicare Other | Source: Ambulatory Visit | Attending: Radiation Oncology | Admitting: Radiation Oncology

## 2020-09-26 DIAGNOSIS — C50212 Malignant neoplasm of upper-inner quadrant of left female breast: Secondary | ICD-10-CM

## 2020-09-26 DIAGNOSIS — Z171 Estrogen receptor negative status [ER-]: Secondary | ICD-10-CM

## 2020-09-26 NOTE — Progress Notes (Signed)
Radiation Oncology         [phone] ________________________________  Initial outpatient Consultation  Name: Donna [name] MRN: [id number]  Date: 09/26/2020  DOB: May 22, 1946  ZD:GUYQIH, Audrea Muscat, PA-C  Magrinat, [name], MD   REFERRING PHYSICIAN: Magrinat, [name], MD  DIAGNOSIS:    ICD-10-CM   1. 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 female who felt a mass in her left breast when showering.  She was ultimately found to have a 3 cm mass in the upper inner quadrant of the left breast which was biopsied in August at [facility] in [place].  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  MRI of the breasts on 02/11/2020 revealed a 4.4 cm mass in the lower inner quadrant extending to the skin surface of the inner breast.  No evidence of right breast malignancy or lymphadenopathy.  Staging scans were  essentially negative:  CT chest and bone scan were performed.   Following neoadjuvant chemo Therapy she underwent left mastectomy and lymph node biopsy on 08/29/2020.  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  Treatment effect present.  2 sentinel lymph nodes were both negative.  She is doing well.  I spoke with the patient and her daughter today.  She has some range of motion issues in her left arm and has not yet seen a physical therapist.  Otherwise she does not have any complaints.   PREVIOUS RADIATION THERAPY: No  PAST MEDICAL HISTORY:  has a past medical history of Arthritis, History of kidney stones, and Hypertension.    PAST SURGICAL HISTORY: Past Surgical History:  Procedure Laterality Date  . CYSTOSCOPY  yrs ago  . IR IMAGING GUIDED PORT INSERTION  02/16/2020  . MASTECTOMY W/ SENTINEL NODE BIOPSY Left 08/29/2020   Procedure: LEFT MASTECTOMY WITH SENTINEL LYMPH NODE BIOPSY;  Surgeon: [name], MD;  Location: [place];  Service: General;  Laterality: Left;  RNFA, PEC BLOCK  . TOTAL KNEE ARTHROPLASTY Left 06/05/2016   Procedure: LEFT TOTAL KNEE ARTHROPLASTY;  Surgeon: [name], MD;  Location[facility];  Service: Orthopedics;  Laterality: Left;    FAMILY HISTORY: family history is not on file.  SOCIAL HISTORY:  reports that she has never smoked. She has never used smokeless tobacco. She reports that she does not drink alcohol and does not use drugs.  ALLERGIES: Benazepril  MEDICATIONS:  Current Outpatient Medications  Medication Sig Dispense Refill  .  amLODipine (NORVASC) 10 MG tablet Take 10 mg by mouth daily after lunch.     . chlorthalidone (HYGROTON) 25 MG tablet Take 25 mg by mouth daily.    Marland Kitchen HYDROcodone-acetaminophen (NORCO/VICODIN) 5-325 MG tablet Take 1-2 tablets by mouth every 6 (six) hours as needed for moderate pain. 10 tablet 0  . loratadine (CLARITIN) 10 MG tablet Take 1 tablet (10 mg total)  by mouth daily as needed for allergies. (Patient not taking: Reported on 08/26/2020) 90 tablet 1  . losartan (COZAAR) 50 MG tablet Take 50 mg by mouth daily.    . methocarbamol (ROBAXIN) 500 MG tablet Take 1 tablet (500 mg total) by mouth every 6 (six) hours as needed for muscle spasms. 20 tablet 1  . Multiple Vitamins-Minerals (CENTRUM ADULTS) TABS Take 1 tablet by mouth daily.    . potassium chloride (MICRO-K) 10 MEQ CR capsule Take 10 mEq by mouth 2 (two) times daily.     No current facility-administered medications for this encounter.    REVIEW OF SYSTEMS: As above   PHYSICAL EXAM:  vitals were not taken for this visit.   General: Alert and oriented, in no acute distress    LABORATORY DATA:  Lab Results  Component Value Date   WBC 3.9 (L) 08/29/2020   HGB 16.0 (H) 08/29/2020   HCT 47.0 (H) 08/29/2020   MCV 89.2 08/29/2020   PLT 269 08/29/2020   CMP     Component Value Date/Time   NA 141 08/29/2020 1409   K 3.3 (L) 08/29/2020 1409   CL 103 08/29/2020 1409   CO2 24 08/29/2020 1250   GLUCOSE 99 08/29/2020 1409   BUN 14 08/29/2020 1409   CREATININE 0.70 08/29/2020 1409   CREATININE 0.86 07/05/2020 0915   CALCIUM 11.0 (H) 08/29/2020 1250   PROT 7.0 07/05/2020 0915   ALBUMIN 3.7 07/05/2020 0915   AST 14 (L) 07/05/2020 0915   ALT 11 07/05/2020 0915   ALKPHOS 86 07/05/2020 0915   BILITOT 0.4 07/05/2020 0915   GFRNONAA >60 08/29/2020 1250   GFRNONAA >60 07/05/2020 0915   GFRAA >60 03/08/2020 0906         RADIOGRAPHY: As above   IMPRESSION/PLAN: This is a lovely 75 year old woman with a history of left breast metaplastic carcinoma.    Based on her metaplastic histology, which is quite aggressive, and with close margins, I think it is reasonable for her to undergo postmastectomy radiation for local regional control.  I recommend a 6-week course of radiation therapy to the left chest wall with prophylactic treatment to the regional nodes.  It was a pleasure meeting the  patient today. We discussed the risks, benefits, and side effects of radiotherapy.  We discussed that radiation would take approximately 6 weeks to complete. We spoke about acute effects including skin irritation/peeling and fatigue as well as much less common late effects including internal organ injury or irritation to normal tissues in the torso/low neck region.  We also discussed  the latest technology that is used to minimize the risk of late effects for patients undergoing radiotherapy to the breast or chest wall.  We specifically talked about methods that we use to protect the heart and lungs to minimize risk of injury. No guarantees of treatment were given. The patient is enthusiastic about proceeding with treatment.  We will get her scheduled for treatment planning in the next couple of weeks.  I will also refer her to physical therapy for postoperative rehabilitation .  This encounter was  provided by telemedicine platform; patient desired telemedicine during pandemic precautions.  MyChart video was not available and therefore telephone was used. The patient has given verbal consent for this type of encounter and has been advised to only accept a meeting of this type in a secure network environment. On date of service, in total, I spent 35 minutes on this encounter.   The attendants for this meeting include Eppie Gibson  and Berta Minor During the encounter, Eppie Gibson was located at [facility] Radiation Oncology Department.  Donna [name] was located at home.      __________________________________________   Eppie Gibson, MD

## 2020-09-27 ENCOUNTER — Ambulatory Visit: Payer: Medicare Other

## 2020-09-27 ENCOUNTER — Ambulatory Visit: Payer: Medicare Other | Admitting: Radiation Oncology

## 2020-10-03 ENCOUNTER — Encounter: Payer: Self-pay | Admitting: *Deleted

## 2020-10-03 ENCOUNTER — Ambulatory Visit: Payer: Medicare Other | Admitting: Radiation Oncology

## 2020-10-10 ENCOUNTER — Ambulatory Visit: Admission: RE | Admit: 2020-10-10 | Payer: Medicare Other | Source: Ambulatory Visit | Admitting: Radiation Oncology

## 2020-10-10 ENCOUNTER — Other Ambulatory Visit: Payer: Self-pay

## 2020-10-12 ENCOUNTER — Ambulatory Visit: Payer: Medicare Other | Attending: Radiation Oncology

## 2020-10-12 ENCOUNTER — Other Ambulatory Visit: Payer: Self-pay

## 2020-10-12 DIAGNOSIS — Z171 Estrogen receptor negative status [ER-]: Secondary | ICD-10-CM | POA: Diagnosis present

## 2020-10-12 DIAGNOSIS — C50312 Malignant neoplasm of lower-inner quadrant of left female breast: Secondary | ICD-10-CM | POA: Diagnosis present

## 2020-10-12 DIAGNOSIS — R293 Abnormal posture: Secondary | ICD-10-CM | POA: Insufficient documentation

## 2020-10-12 DIAGNOSIS — Z483 Aftercare following surgery for neoplasm: Secondary | ICD-10-CM | POA: Diagnosis present

## 2020-10-12 DIAGNOSIS — M4004 Postural kyphosis, thoracic region: Secondary | ICD-10-CM | POA: Diagnosis present

## 2020-10-12 DIAGNOSIS — M25612 Stiffness of left shoulder, not elsewhere classified: Secondary | ICD-10-CM | POA: Insufficient documentation

## 2020-10-12 NOTE — Therapy (Signed)
[place], [place], [address] Phone: [phone]   Fax:  [phone]  Physical Therapy Treatment  Patient Details  Name: Donna [name] MRN: [id number] Date of Birth: 03/13/1946 Referring Provider (PT): Dr. Harlow Ohms. Marlou Starks   Encounter Date: 10/12/2020   PT End of Session - 10/12/20 1652    Visit Number 2    Number of Visits 12    Date for PT Re-Evaluation 11/23/20    PT Start Time 1606    PT Stop Time 1703    PT Time Calculation (min) 57 min    Activity Tolerance Patient tolerated treatment well    Behavior During Therapy [facility] for tasks assessed/performed           Past Medical History:  Diagnosis Date  . Arthritis    oa  . History of kidney stones   . Hypertension     Past Surgical History:  Procedure Laterality Date  . CYSTOSCOPY  yrs ago  . IR IMAGING GUIDED PORT INSERTION  02/16/2020  . MASTECTOMY W/ SENTINEL NODE BIOPSY Left 08/29/2020   Procedure: LEFT MASTECTOMY WITH SENTINEL LYMPH NODE BIOPSY;  Surgeon: [name], MD;  Location: [place];  Service: General;  Laterality: Left;  RNFA, PEC BLOCK  . TOTAL KNEE ARTHROPLASTY Left 06/05/2016   Procedure: LEFT TOTAL KNEE ARTHROPLASTY;  Surgeon: [name], MD;  [facility];  Service: Orthopedics;  Laterality: Left;    There were no vitals filed for this visit.   Subjective Assessment - 10/12/20 1507    Subjective Pt had a left mastectomy on 08/29/2020.  She had 11 chemo treatments. They cancelled her simulation for radiation because she can't get her arm back over her head.    New simulation date Oct 31, 2020.  She injured her left shoulder over 10 years and hasn't used it normally since that time. She reports no pain in the shoulder    Patient is accompained by: Family member    Pertinent History Left mastectomy on 08/29/20  for Triple Negative Carcinoma with  neoadjuvant chemo 02/23/20, and adjuvant radiation.  She is presently  staying in town with her children but is looking forward to going home to [place].    Patient Stated Goals Get arm up enough to have radiation    Currently in Pain? No/denies              [facility] PT Assessment - 10/12/20 0001      Assessment   Medical Diagnosis Malignant neoplasm lower inner quadrant Left breast    Referring Provider (PT) Dr. Marlou Starks      Precautions   Precaution Comments lymphadema risk      Restrictions   Weight Bearing Restrictions No      Balance Screen   Has the patient fallen in the past 6 months No    Has the patient had a decrease in activity level because of a fear of falling?  No    Is the patient reluctant to leave their home because of a fear of falling?  No      Home Environment   Living Environment Private residence    Living Arrangements Children    Available Help at Discharge Family      Prior Function   Level of [place] Retired    Leisure reading      Cognition   Overall Cognitive Status Within Functional Limits for tasks assessed  Observation/Other Assessments   Observations Post mastectomy;incision healing well. some exta skin noted at medial and lateral incsion but does not appear swollen      PROM   Left Shoulder Flexion 116 Degrees   after stretching   Left Shoulder ABduction 110 Degrees   slight scaption   Left Shoulder External Rotation 15 Degrees   at 45 deg abd            LYMPHEDEMA/ONCOLOGY QUESTIONNAIRE - 10/12/20 0001      Type   Cancer Type Malignant neoplasm lower inner quadrant Left breast Triple Negative      Surgeries   Mastectomy Date 08/29/20    Sentinel Lymph Node Biopsy Date 08/29/20      Treatment   Active Chemotherapy Treatment No    Past Chemotherapy Treatment Yes    Active Radiation Treatment No    Past Radiation Treatment No    Current Hormone Treatment No    Past Hormone Therapy No      What other symptoms do you have   Are you Having Heaviness or Tightness No     Are you having Pain No    Are you having pitting edema No    Is it Hard or Difficult finding clothes that fit No    Do you have infections No    Is there Decreased scar mobility No      Left Upper Extremity Lymphedema   10 cm Proximal to Olecranon Process 27.7 cm    Olecranon Process 26 cm    15 cm Proximal to Ulnar Styloid Process 24.5 cm    10 cm Proximal to Ulnar Styloid Process 20.3 cm    Just Proximal to Ulnar Styloid Process 17.2 cm    Across Hand at [employer] 20.1 cm    At [place] of 2nd Digit 6.4 cm           L-DEX FLOWSHEETS - 10/12/20 1700      L-DEX LYMPHEDEMA SCREENING   Measurement Type Unilateral    L-DEX MEASUREMENT EXTREMITY Upper Extremity    POSITION  Standing    DOMINANT SIDE Right    At Risk Side Left    BASELINE SCORE (UNILATERAL) -5.9   (05/04/20)            Quick Dash - 10/12/20 0001    Open a tight or new jar Mild difficulty    Do heavy household chores (wash walls, wash floors) No difficulty    Carry a shopping bag or briefcase No difficulty    Wash your back No difficulty    Use a knife to cut food No difficulty    Recreational activities in which you take some force or impact through your arm, shoulder, or hand (golf, hammering, tennis) Mild difficulty    During the past week, to what extent has your arm, shoulder or hand problem interfered with your normal social activities with family, friends, neighbors, or groups? Modererately    During the past week, to what extent has your arm, shoulder or hand problem limited your work or other regular daily activities Slightly    Arm, shoulder, or hand pain. Moderate    Tingling (pins and needles) in your arm, shoulder, or hand None    Difficulty Sleeping No difficulty    DASH Score 15.91 %                          PT Education - 10/12/20 1651  Education Details Pt was educated in supine wand flexion and stargazer stretch to perform 3 xs per day with 5-8 reps ea.  Pt to  monitor for pain and not push through pain and to try to keep shoulder from elevating.  Pts son will also keep an eye on her.    Person(s) Educated Patient;Child(ren)    Methods Explanation;Handout    Comprehension Returned demonstration;Verbalized understanding               PT Long Term Goals - 10/12/20 1658      PT LONG TERM GOAL #1   Title Pt will be independent and compliant with a HEP for left shoulder ROM/strength    Time 4    Period Weeks    Status New    Target Date 11/16/20      PT LONG TERM GOAL #2   Title Pts PROM/AA ROM left shoulder improved to 130 deg  flexion    Time 4    Period Weeks    Status New      PT LONG TERM GOAL #3   Title Pt able to get her arm into better position for radiation    Time 3    Period Weeks    Status New    Target Date 10/31/20                 Plan - 10/12/20 1653    Clinical Impression Statement Pt presented for therapy with her son Donna [name].  She had mastectomy for triple negative Carcinoma on 08/29/2020.  She is unable to have radiation presently secondary to left shoulder stiffness from an injury over 10 years ago.  Shoulder has firm end feels and intermittent crepitus. It is not painful and despite firm end feels she did make improvements in PROM today.  She does require alot of cueing to relax for PROM.  She did very well with supine wand and stargazer stretch and these were added to HEP    Personal Factors and Comorbidities Age;Comorbidity 2;Comorbidity 3+    Comorbidities age, recent Cancer diagnosis Triple negative,Left shoulder limitations in AROM pre surgery    Stability/Clinical Decision Making Stable/Uncomplicated    Rehab Potential Good    PT Frequency 2x / week    PT Duration 6 weeks    PT Treatment/Interventions Therapeutic exercise;Manual techniques;Patient/family education;Manual lymph drainage;Passive range of motion;Joint Manipulations    PT Next Visit Plan [facility] Joint mobs left shoulder, REview Supine wand  flexion, try for scaption, Stargazer stretch supine, Continue PROM,    Recommended Other Services schedule [facility] around June 21    Consulted and Agree with Plan of Care Patient;Family member/caregiver    Family Member Consulted son Donna [name]           Patient will benefit from skilled therapeutic intervention in order to improve the following deficits and impairments:  Decreased knowledge of precautions,Decreased range of motion,Decreased strength,Impaired UE functional use,Postural dysfunction  Visit Diagnosis: Abnormal posture  Aftercare following surgery for neoplasm  Stiffness of left shoulder, not elsewhere classified  Malignant neoplasm of lower-inner quadrant of left breast in female, estrogen receptor negative ([place])     Problem List Patient Active Problem List   Diagnosis Date Noted  . Cancer of left female breast ([place]) 08/29/2020  . Port-A-Cath in place 03/22/2020  . Malignant neoplasm of upper-inner quadrant of left breast in female, estrogen receptor negative ([place]) 02/04/2020  . 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 Overweight (BMI 25.0-29.9) 06/06/2016  .  Hypokalemia 06/06/2016  . S/P left TKA 06/05/2016    Claris Pong 10/12/2020, 5:08 PM  [place], [place], [address] Phone: [phone]   Fax:  [phone]  Name: Donna [name] MRN: [id number] Date of Birth: [birth year] Cheral Almas, PT 10/12/20 5:10 PM

## 2020-10-12 NOTE — Patient Instructions (Signed)
SHOULDER: Flexion - Supine (Cane)        Cancer Rehab [phone]      Shoulder Blade Stretch    Clasp fingers behind head with elbows touching in front of face. Pull elbows back while pressing shoulder blades together. Relax and hold as tolerated, can place pillow under elbow here for comfort as needed and to allow for prolonged stretch.  Repeat __5__ times. Do __1-2__ sessions per day.      Copyright  VHI. All rights reserved.

## 2020-10-17 ENCOUNTER — Ambulatory Visit: Payer: Medicare Other | Admitting: Radiation Oncology

## 2020-10-17 NOTE — Progress Notes (Signed)
[facility]  [phone] [phone]     ID: Donna [name] DOB: November 15, 1945  MR#: [id number]  QIW#:[id number]  Patient Care Team: Montez Morita, PA-C as PCP - General (Hematology and Oncology) Magrinat, [name], MD as Consulting Physician (Oncology) [name], MD as Consulting Physician (General Surgery) [name], MD as Consulting Physician (Orthopedic Surgery) [name], RN as Oncology Nurse Navigator [name], RN as Oncology Nurse Navigator Chauncey Cruel, MD OTHER MD: [name] MD ([place]); [name] NP   CHIEF COMPLAINT: Triple negative breast cancer  CURRENT TREATMENT: Adjuvant radiation pending   INTERVAL HISTORY: [name] returns today for follow up of her triple negative breast cancer accompanied by her son [name].  She completed her neoadjuvant chemotherapy on 07/05/2020.  She underwent post-treatment breast MRI on 07/15/2020 showing: breast composition B; interval decrease in size of lower-inner left breast malignancy, currently 3.5 cm (previously 4.4 cm); no evidence of malignancy elsewhere in either breast and no adenopathy.  She opted to proceed with left mastectomy on 08/29/2020 under Dr. Marlou Starks. Pathology from the procedure [phone]) showed: metaplastic carcinoma, grade 3, 3.8 cm; margins negative. Prognostic panel was repeated showing: estrogen receptor negative; progesterone receptor 5% positive with strong staining intensity; Her2 equivocal by immunohistochemistry (2+) but negative by FISH.  Both biopsied lymph nodes were negative (0/2).  She was referred back to Dr. Isidore Moos on 09/26/2020 to discuss postmastectomy radiation therapy. Per Dr. Pearlie Oyster note, the patient elected to undergo treatment. She is currently undergoing physical therapy and will proceed with CT simulation on 10/31/2020.   REVIEW OF SYSTEMS: Lyly did well with her surgery.  She had very little pain, no significant  bleeding or fever.  She kept her drains about 2 weeks and she was of course very relieved when those came out.  She is having significant limitation of range of motion of the left upper extremity and that is an issue for radiation so she is getting some physical therapy for that at this point.  For Mother's Day she helped her daughter cook.  Otherwise a detailed review of systems today was stable   COVID 19 VACCINATION STATUS: s/p Moderna x2, with booster pending   HISTORY OF CURRENT ILLNESS: From the original intake note:  Donna [name] herself palpated a mass in her left breast during showering.  She brought this to medical attention and on 12/16/2019 underwent mammography, the results of which I do not have today.  She proceeded to left breast ultrasound showing breast density B.  There was a mass in the upper inner quadrant of the left breast at the 10 o'clock position 7 cm from the nipple.  It measured 3.26 cm.  There are no ultrasound comments regarding the axilla.  Biopsy of the mass in question was performed 01/15/2020 at [facility] and the pathology report describes a biphasic tumor with malignant epithelial component and a mesenchymal component.  The epithelial component appears to squamoid and stain strongly for CK 8/18 by immunohistochemistry.  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  The tumor was essentially triple negative, estrogen receptor 0%, progesterone receptor 1% with 1+ intensity, HER-2 1+ by immunohistochemistry, Ki-67 12%.  (The 1% progesterone receptor positivity is best read as an artifact as the progesterone receptor does not function in the absence of a functional estrogen receptor).  The patient's subsequent history is as detailed below   PAST MEDICAL  HISTORY: Past Medical History:  Diagnosis Date  . Arthritis    oa  . History of kidney stones   . Hypertension      PAST SURGICAL HISTORY: Past Surgical History:  Procedure Laterality Date  . CYSTOSCOPY  yrs ago  . IR IMAGING GUIDED PORT INSERTION  02/16/2020  . MASTECTOMY W/ SENTINEL NODE BIOPSY Left 08/29/2020   Procedure: LEFT MASTECTOMY WITH SENTINEL LYMPH NODE BIOPSY;  Surgeon: [name], MD;  Location: [facility];  Service: General;  Laterality: Left;  RNFA, PEC BLOCK  . TOTAL KNEE ARTHROPLASTY Left 06/05/2016   Procedure: LEFT TOTAL KNEE ARTHROPLASTY;  Surgeon: [name], MD;  Location: [facility];  Service: Orthopedics;  Laterality: Left;    FAMILY HISTORY No family history on file.  The patient has no information on her father.  Her mother died at the age of 73.  She had 1 sister and 2 brothers.  No one on that side of the family is known to have had cancer.  The patient herself had 5/2 siblings, 2 sisters and 3 brothers, none with a history of cancer.   GYNECOLOGIC HISTORY:  No LMP recorded (lmp unknown). Patient is postmenopausal. Menarche: 75 years old Age at first live birth: 75 years old [place] P 2 LMP mid 73s Contraceptive no HRT no  Hysterectomy?  No Salpingo-oophorectomy?  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  She is divorced and her former husband subsequently died.  She lives by herself with no pets.  Her son [name] 73 is a Production manager and travels a great deal as a Optometrist.  Daughter [name] teaches middle school in [place].  The patient has 4 grandchildren, no great-grandchildren.  She is a Psychologist, forensic.    ADVANCED DIRECTIVES: Not in place   HEALTH MAINTENANCE: Social History   Tobacco Use  . Smoking status: Never Smoker  . Smokeless tobacco: Never Used  Vaping Use  . Vaping Use: Never used  Substance Use Topics  . Alcohol use: No  . Drug use: No     Colonoscopy: Never  PAP: Remote  Bone density: Remote   Allergies  Allergen Reactions  . Benazepril Swelling    Patient had severe angioedema on  10/05/2015.    Current Outpatient Medications  Medication Sig Dispense Refill  . amLODipine (NORVASC) 10 MG tablet Take 10 mg by mouth daily after lunch.     . chlorthalidone (HYGROTON) 25 MG tablet Take 25 mg by mouth daily. (Patient not taking: Reported on 10/12/2020)    . HYDROcodone-acetaminophen (NORCO/VICODIN) 5-325 MG tablet Take 1-2 tablets by mouth every 6 (six) hours as needed for moderate pain. (Patient not taking: Reported on 10/12/2020) 10 tablet 0  . loratadine (CLARITIN) 10 MG tablet Take 1 tablet (10 mg total) by mouth daily as needed for allergies. (Patient not taking: No sig reported) 90 tablet 1  . losartan (COZAAR) 50 MG tablet Take 50 mg by mouth daily.    . methocarbamol (ROBAXIN) 500 MG tablet Take 1 tablet (500 mg total) by mouth every 6 (six) hours as needed for muscle spasms. (Patient not taking: Reported on 10/12/2020) 20 tablet 1  . Multiple Vitamins-Minerals (CENTRUM ADULTS) TABS Take 1 tablet by mouth daily.    . potassium chloride (MICRO-K) 10 MEQ CR capsule Take 10 mEq by mouth 2 (two) times daily.     No current facility-administered medications for this visit.    OBJECTIVE: African-American woman  who appears stated age 57:   10/18/20 1214  BP: (!) 145/78  Pulse: (!) 29  Resp: 18  Temp: 97.7 F (36.5 C)  SpO2: 100%     Body mass index is 25.46 kg/m.   Wt Readings from Last 3 Encounters:  10/18/20 148 lb 4.8 oz (67.3 kg)  08/29/20 150 lb (68 kg)  07/26/20 146 lb (66.2 kg)      ECOG FS:1 - Symptomatic but completely ambulatory  Sclerae unicteric, EOMs intact Wearing a mask No cervical or supraclavicular adenopathy Lungs no rales or rhonchi Heart regular rate and rhythm Abd soft, nontender, positive bowel sounds MSK no focal spinal tenderness, very limited range of motion left upper extremity, only to about 80 degrees abduction Neuro: nonfocal, well oriented, appropriate affect Breasts: The left breast is status post mastectomy.  The incision is  healing nicely without erythema swelling or dehiscence.   LAB RESULTS:  CMP     Component Value Date/Time   NA 141 08/29/2020 1409   K 3.3 (L) 08/29/2020 1409   CL 103 08/29/2020 1409   CO2 24 08/29/2020 1250   GLUCOSE 99 08/29/2020 1409   BUN 14 08/29/2020 1409   CREATININE 0.70 08/29/2020 1409   CREATININE 0.86 07/05/2020 0915   CALCIUM 11.0 (H) 08/29/2020 1250   PROT 7.0 07/05/2020 0915   ALBUMIN 3.7 07/05/2020 0915   AST 14 (L) 07/05/2020 0915   ALT 11 07/05/2020 0915   ALKPHOS 86 07/05/2020 0915   BILITOT 0.4 07/05/2020 0915   GFRNONAA >60 08/29/2020 1250   GFRNONAA >60 07/05/2020 0915   GFRAA >60 03/08/2020 0906    No results found for: TOTALPROTELP, ALBUMINELP, A1GS, A2GS, BETS, BETA2SER, GAMS, MSPIKE, SPEI  No results found for: KPAFRELGTCHN, LAMBDASER, KAPLAMBRATIO  Lab Results  Component Value Date   WBC 3.9 (L) 08/29/2020   NEUTROABS 1.6 (L) 07/05/2020   HGB 16.0 (H) 08/29/2020   HCT 47.0 (H) 08/29/2020   MCV 89.2 08/29/2020   PLT 269 08/29/2020      Chemistry      Component Value Date/Time   NA 141 08/29/2020 1409   K 3.3 (L) 08/29/2020 1409   CL 103 08/29/2020 1409   CO2 24 08/29/2020 1250   BUN 14 08/29/2020 1409   CREATININE 0.70 08/29/2020 1409   CREATININE 0.86 07/05/2020 0915      Component Value Date/Time   CALCIUM 11.0 (H) 08/29/2020 1250   ALKPHOS 86 07/05/2020 0915   AST 14 (L) 07/05/2020 0915   ALT 11 07/05/2020 0915   BILITOT 0.4 07/05/2020 0915       No results found for: LABCA2  No components found for: GBMSXJ155  No results for input(s): INR in the last 168 hours.  No results found for: LABCA2  No results found for: MCE022  No results found for: VVK122  No results found for: ESL753  No results found for: CA2729  No components found for: HGQUANT  No results found for: CEA1 / No results found for: CEA1   No results found for: AFPTUMOR  No results found for: CHROMOGRNA  No results found for: HGBA,  HGBA2QUANT, HGBFQUANT, HGBSQUAN (Hemoglobinopathy evaluation)   No results found for: LDH  No results found for: IRON, TIBC, IRONPCTSAT (Iron and TIBC)  No results found for: FERRITIN  Urinalysis    Component Value Date/Time   COLORURINE YELLOW 05/24/2020 0950   APPEARANCEUR HAZY (A) 05/24/2020 0950   LABSPEC 1.015 05/24/2020 0950   PHURINE 6.0 05/24/2020 0950   GLUCOSEU  NEGATIVE 05/24/2020 0950   HGBUR NEGATIVE 05/24/2020 0950   BILIRUBINUR NEGATIVE 05/24/2020 0950   KETONESUR NEGATIVE 05/24/2020 0950   PROTEINUR 100 (A) 05/24/2020 0950   NITRITE NEGATIVE 05/24/2020 0950   LEUKOCYTESUR SMALL (A) 05/24/2020 0950    STUDIES: No results found.   ELIGIBLE FOR AVAILABLE RESEARCH PROTOCOL: no  ASSESSMENT: 75 y.o. [place] woman status post left breast upper inner quadrant biopsy 01/15/2020 for a clinical T2 NX, stage IIB anaplastic carcinoma, triple negative, with an MIB-1 of 12%  (a) bone scan and CT chest 02/12/2020 show no evidence of metastatic disease  (1) neoadjuvant chemotherapy with doxorubicin and cyclophosphamide in dose dense fashion x4 started 02/23/2020, followed by weekly paclitaxel and carboplatin x12 starting 04/19/2020  (a) echo 02/12/2020 shows an ejection fraction in the 60-65% range  (2) left mastectomy and sentinel lymph node sampling 08/29/2020 showed a residual ypT2 ypN0 metaplastic carcinoma, grade 3, with negative margins.  (a) a total of 2 left axillary lymph nodes were removed  (b) repeat prognostic panel shows the tumor to be estrogen receptor negative but progesterone receptor positive at 5% with strong staining intensity.  HER2 was equivocal by immunotherapy but negative by FISH  (3) adjuvant radiation pending  (4) to start anastrozole at the completion of local treatment   PLAN: Jaylynn did very well with her surgery.  As soon as she can get her left arm up to her ear or close she can start her radiation treatments and she is  scheduled for simulation tomorrow.  Repeat prognostic panel on the final pathology shows a positive progesterone and negative estrogen.  Since the progesterone receptor it does not work unless the estrogen receptor does work then there is the presumption of some estrogen receptor activity.  First of all there are at least 2 different types of estrogen receptors and we do not test for both.  Second there are cases in where the cascade of activation from estrogen receptor to nucleus is active even though the receptor is negative.  Accordingly I think she will benefit from anastrozole and we will start that as soon as she recovers from radiation.  In preparation for that I have made her a return appointment with me in August of this year.  She knows to call for any other issue that may develop before then.  Total encounter time 25 minutes.Chauncey Cruel, MD   10/18/2020 12:17 PM Medical Oncology and Hematology [facility] [place], [place] [address] Tel. [phone]    Fax. [phone]   I, Wilburn Mylar, am acting as scribe for Dr. [name]. Magrinat.  I, Lurline Del MD, have reviewed the above documentation for accuracy and completeness, and I agree with the above.   *Total Encounter Time as defined by the Centers for Medicare and Medicaid Services includes, in addition to the face-to-face time of a patient visit (documented in the note above) non-face-to-face time: obtaining and reviewing outside history, ordering and reviewing medications, tests or procedures, care coordination (communications with other health care professionals or caregivers) and documentation in the medical record.

## 2020-10-18 ENCOUNTER — Telehealth: Payer: Self-pay | Admitting: *Deleted

## 2020-10-18 ENCOUNTER — Other Ambulatory Visit: Payer: Self-pay

## 2020-10-18 ENCOUNTER — Ambulatory Visit: Payer: Medicare Other

## 2020-10-18 ENCOUNTER — Encounter: Payer: Self-pay | Admitting: *Deleted

## 2020-10-18 ENCOUNTER — Inpatient Hospital Stay: Payer: Medicare Other | Attending: Oncology | Admitting: Oncology

## 2020-10-18 VITALS — BP 145/78 | HR 29 | Temp 97.7°F | Resp 18 | Ht 64.0 in | Wt 148.3 lb

## 2020-10-18 DIAGNOSIS — Z79899 Other long term (current) drug therapy: Secondary | ICD-10-CM | POA: Insufficient documentation

## 2020-10-18 DIAGNOSIS — Z9012 Acquired absence of left breast and nipple: Secondary | ICD-10-CM | POA: Diagnosis not present

## 2020-10-18 DIAGNOSIS — Z79811 Long term (current) use of aromatase inhibitors: Secondary | ICD-10-CM | POA: Diagnosis not present

## 2020-10-18 DIAGNOSIS — Z171 Estrogen receptor negative status [ER-]: Secondary | ICD-10-CM | POA: Diagnosis not present

## 2020-10-18 DIAGNOSIS — C50212 Malignant neoplasm of upper-inner quadrant of left female breast: Secondary | ICD-10-CM | POA: Diagnosis not present

## 2020-10-18 DIAGNOSIS — C50919 Malignant neoplasm of unspecified site of unspecified female breast: Secondary | ICD-10-CM

## 2020-10-18 NOTE — Telephone Encounter (Signed)
Manual recheck of pulse noted as 55 bpm- informed MD.

## 2020-10-19 ENCOUNTER — Ambulatory Visit: Payer: Medicare Other

## 2020-10-20 ENCOUNTER — Ambulatory Visit: Payer: Medicare Other

## 2020-10-21 ENCOUNTER — Ambulatory Visit: Payer: Medicare Other

## 2020-10-24 ENCOUNTER — Ambulatory Visit: Payer: Medicare Other

## 2020-10-25 ENCOUNTER — Ambulatory Visit: Payer: Medicare Other

## 2020-10-26 ENCOUNTER — Ambulatory Visit: Payer: Medicare Other

## 2020-10-26 ENCOUNTER — Other Ambulatory Visit: Payer: Self-pay

## 2020-10-26 DIAGNOSIS — Z171 Estrogen receptor negative status [ER-]: Secondary | ICD-10-CM

## 2020-10-26 DIAGNOSIS — C50312 Malignant neoplasm of lower-inner quadrant of left female breast: Secondary | ICD-10-CM

## 2020-10-26 DIAGNOSIS — R293 Abnormal posture: Secondary | ICD-10-CM | POA: Diagnosis not present

## 2020-10-26 DIAGNOSIS — Z483 Aftercare following surgery for neoplasm: Secondary | ICD-10-CM

## 2020-10-26 DIAGNOSIS — M25612 Stiffness of left shoulder, not elsewhere classified: Secondary | ICD-10-CM

## 2020-10-26 DIAGNOSIS — M4004 Postural kyphosis, thoracic region: Secondary | ICD-10-CM

## 2020-10-26 NOTE — Therapy (Signed)
[place], [place], [address] Phone: [phone]   Fax:  [phone]  Physical Therapy Treatment  Patient Details  Name: Donna [name] MRN: [id number] Date of Birth: Sep 01, 1945 Referring Provider (PT): Dr. Marlou Starks   Encounter Date: 10/26/2020   PT End of Session - 10/26/20 0856    Visit Number 3    Number of Visits 12    Date for PT Re-Evaluation 11/23/20    PT Start Time 0803    PT Stop Time 0854    PT Time Calculation (min) 51 min    Activity Tolerance Patient tolerated treatment well    Behavior During Therapy [facility] for tasks assessed/performed           Past Medical History:  Diagnosis Date  . Arthritis    oa  . History of kidney stones   . Hypertension     Past Surgical History:  Procedure Laterality Date  . CYSTOSCOPY  yrs ago  . IR IMAGING GUIDED PORT INSERTION  02/16/2020  . MASTECTOMY W/ SENTINEL NODE BIOPSY Left 08/29/2020   Procedure: LEFT MASTECTOMY WITH SENTINEL LYMPH NODE BIOPSY;  Surgeon: [name], MD;  Location: [facility];  Service: General;  Laterality: Left;  RNFA, PEC BLOCK  . TOTAL KNEE ARTHROPLASTY Left 06/05/2016   Procedure: LEFT TOTAL KNEE ARTHROPLASTY;  Surgeon: [name], MD;  [facility];  Service: Orthopedics;  Laterality: Left;    There were no vitals filed for this visit.   Subjective Assessment - 10/26/20 0803    Subjective Pt reports sleeping on her left shoulder wrong last night and it is stiff and sore. She has been compliant with HEP 2-3 times a day. Feel like I am able to go a little further now.    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  She is presently staying in town with her children but is looking forward to going home to [place].    Patient Stated Goals Get arm up enough to have radiation    Currently in Pain? No/denies                              [facility] Adult PT Treatment/Exercise - 10/26/20 0001      Shoulder Exercises: Supine   Other Supine Exercises AA wand flex,scaption x 5, star gazer x 5      Manual Therapy   Manual Therapy Joint mobilization;Scapular mobilization;Passive ROM;Manual Traction    Joint Mobilization Left GH mobs gr 2/3    Scapular Mobilization Right SL left  scap mobs all directions   Passive ROM PROM/AA ROM to left shoulder flex, scaption, D2 flexER with VCs to relax                       PT Long Term Goals - 10/12/20 1658      PT LONG TERM GOAL #1   Title Pt will be independent and compliant with a HEP for left shoulder ROM/strength    Time 4    Period Weeks    Status New    Target Date 11/16/20      PT LONG TERM GOAL #2   Title Pts PROM/AA ROM left shoulder improved to 130 deg  flexion    Time 4    Period Weeks    Status New  PT LONG TERM GOAL #3   Title Pt able to get her arm into better position for radiation    Time 3    Period Weeks    Status New    Target Date 10/31/20                 Plan - 10/26/20 0856    Clinical Impression Statement Therapy consisted of joint mobilizations, scapular mobilizations, AAROM and PROM to restore left shoulder ROM to get pt ready for radiation.  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  She is compliant with HEP.  Pt reported soreness was gone after therapy today and ROM was slightly improved. Occasional crepitus noted but was not painful.  Pt has good tolerance but requires VC's to relax    Personal Factors and Comorbidities Age;Comorbidity 2;Comorbidity 3+    Comorbidities age, recent Cancer diagnosis Triple negative,Left shoulder limitations in AROM pre surgery    Stability/Clinical Decision Making Stable/Uncomplicated    Rehab Potential Good    PT Frequency 2x / week    PT Duration 6 weeks    PT Treatment/Interventions Therapeutic exercise;Manual  techniques;Patient/family education;Manual lymph drainage;Passive range of motion;Joint Manipulations    PT Next Visit Plan [place] Joint mobs/scap. mobs left shoulder, REview Supine wand flexion, try for scaption, Stargazer stretch supine, Continue PROM/AA    PT Home Exercise Plan 3 post op exs excluding wall walk, flex and ER done in supine    Consulted and Agree with Plan of Care Patient;Family member/caregiver           Patient will benefit from skilled therapeutic intervention in order to improve the following deficits and impairments:  Decreased knowledge of precautions,Decreased range of motion,Decreased strength,Impaired UE functional use,Postural dysfunction  Visit Diagnosis: Abnormal posture  Aftercare following surgery for neoplasm  Stiffness of left shoulder, not elsewhere classified  Malignant neoplasm of lower-inner quadrant of left breast in female, estrogen receptor negative ([place])  Postural kyphosis of thoracic region     Problem List Patient Active Problem List   Diagnosis Date Noted  . Cancer of left female breast ([place]) 08/29/2020  . Port-A-Cath in place 03/22/2020  . Malignant neoplasm of upper-inner quadrant of left breast in female, estrogen receptor negative ([place]) 02/04/2020  . Metaplastic carcinoma of breast ([place]) 02/04/2020  . Overweight (BMI 25.0-29.9) 06/06/2016  . Hypokalemia 06/06/2016  . S/P left TKA 06/05/2016    Claris Pong 10/26/2020, 10:05 AM  [place], [place], [address] Phone: [phone]   Fax:  [phone]  Name: Devin Foskey MRN: [id number] Date of Birth: [birth year] Cheral Almas, PT 10/26/20 10:07 AM

## 2020-10-27 ENCOUNTER — Ambulatory Visit: Payer: Medicare Other

## 2020-10-28 ENCOUNTER — Ambulatory Visit: Payer: Medicare Other

## 2020-10-28 ENCOUNTER — Other Ambulatory Visit: Payer: Self-pay

## 2020-10-28 DIAGNOSIS — R293 Abnormal posture: Secondary | ICD-10-CM

## 2020-10-28 DIAGNOSIS — Z483 Aftercare following surgery for neoplasm: Secondary | ICD-10-CM

## 2020-10-28 DIAGNOSIS — C50312 Malignant neoplasm of lower-inner quadrant of left female breast: Secondary | ICD-10-CM

## 2020-10-28 DIAGNOSIS — M4004 Postural kyphosis, thoracic region: Secondary | ICD-10-CM

## 2020-10-28 DIAGNOSIS — M25612 Stiffness of left shoulder, not elsewhere classified: Secondary | ICD-10-CM

## 2020-10-28 DIAGNOSIS — Z171 Estrogen receptor negative status [ER-]: Secondary | ICD-10-CM

## 2020-10-28 NOTE — Therapy (Signed)
[place], [place], [address] Phone: [phone]   Fax:  [phone]  Physical Therapy Treatment  Patient Details  Name: Donna [name] MRN: [id number] Date of Birth: 1945/06/16 Referring Provider (PT): Dr. Marlou Starks   Encounter Date: 10/28/2020   PT End of Session - 10/28/20 0804    Visit Number 4    Number of Visits 12    Date for PT Re-Evaluation 11/23/20    PT Start Time 0758    PT Stop Time 0851    PT Time Calculation (min) 53 min    Activity Tolerance Patient tolerated treatment well    Behavior During Therapy [facility] for tasks assessed/performed           Past Medical History:  Diagnosis Date  . Arthritis    oa  . History of kidney stones   . Hypertension     Past Surgical History:  Procedure Laterality Date  . CYSTOSCOPY  yrs ago  . IR IMAGING GUIDED PORT INSERTION  02/16/2020  . MASTECTOMY W/ SENTINEL NODE BIOPSY Left 08/29/2020   Procedure: LEFT MASTECTOMY WITH SENTINEL LYMPH NODE BIOPSY;  Surgeon: [name], MD;  Location: [place];  Service: General;  Laterality: Left;  RNFA, PEC BLOCK  . TOTAL KNEE ARTHROPLASTY Left 06/05/2016   Procedure: LEFT TOTAL KNEE ARTHROPLASTY;  Surgeon: [name], MD;  [facility];  Service: Orthopedics;  Laterality: Left;    There were no vitals filed for this visit.   Subjective Assessment - 10/28/20 0759    Subjective Mild soreness after last visit.  This am it is a little stiff but no pain. She reports compliance with HEP    Pertinent History Left mastectomy on 08/29/20  for Triple Negative Carcinoma with  neoadjuvant chemo 02/23/20, and adjuvant radiation.  She is presently staying in town with her children but is looking forward to going home to [place].    Patient Stated Goals Get arm up enough to have radiation    Currently in Pain? No/denies              [facility] PT Assessment - 10/28/20 0001      PROM   Left Shoulder Flexion 123  Degrees    Left Shoulder ABduction 130 Degrees   scaption   Left Shoulder External Rotation 15 Degrees                         [facility] Adult PT Treatment/Exercise - 10/28/20 0001      Shoulder Exercises: Supine   Other Supine Exercises AA wand flex,scaption x 5, star gazer x 5      Manual Therapy   Manual Therapy Joint mobilization;Scapular mobilization;Passive ROM;Manual Traction    Joint Mobilization Left GH mobs gr 2/3    Scapular Mobilization Right SL scap mobs    Passive ROM PROM/AA ROM to left shoulder flex, scaption, D2 flexER with VCs to relax                       PT Long Term Goals - 10/12/20 1658      PT LONG TERM GOAL #1   Title Pt will be independent and compliant with a HEP for left shoulder ROM/strength    Time 4    Period Weeks    Status New    Target Date 11/16/20      PT LONG TERM GOAL #2   Title Pts PROM/AA ROM  left shoulder improved to 130 deg  flexion    Time 4    Period Weeks    Status New      PT LONG TERM GOAL #3   Title Pt able to get her arm into better position for radiation    Time 3    Period Weeks    Status New    Target Date 10/31/20                 Plan - 10/28/20 0805    Clinical Impression Statement Therapy continued with [facility] joint mobs and scapular mobs, AAROM and PROM activities to restore left shoulder ROM.  Shoulder flex today 123, scaption 130, and ER 15.  Shoulder is still very limited with firm end feels but slightly improved overall.  ER is still the most limited.  She has radiation simulation on Monday and not sure if anything can be modified for her ROM limitations.    Personal Factors and Comorbidities Age;Comorbidity 2;Comorbidity 3+    Comorbidities age, recent Cancer diagnosis Triple negative,Left shoulder limitations in AROM pre surgery    Examination-Activity Limitations Reach Overhead;Dressing    Stability/Clinical Decision Making Stable/Uncomplicated    Rehab Potential Good    PT  Frequency 2x / week    PT Duration 6 weeks    PT Treatment/Interventions Therapeutic exercise;Manual techniques;Patient/family education;Manual lymph drainage;Passive range of motion;Joint Manipulations    PT Next Visit Plan [facility] Joint mobs/scap. mobs left shoulder, REview Supine wand flexion, try for scaption, Stargazer stretch supine, Continue PROM/AA, standing lat stretch    PT Home Exercise Plan 3 post op exs excluding wall walk, flex and ER done in supine    Consulted and Agree with Plan of Care Patient;Family member/caregiver    Family Member Consulted son [name]           Patient will benefit from skilled therapeutic intervention in order to improve the following deficits and impairments:  Decreased knowledge of precautions,Decreased range of motion,Decreased strength,Impaired UE functional use,Postural dysfunction  Visit Diagnosis: Abnormal posture  Aftercare following surgery for neoplasm  Stiffness of left shoulder, not elsewhere classified  Malignant neoplasm of lower-inner quadrant of left breast in female, estrogen receptor negative ([place])  Postural kyphosis of thoracic region     Problem List Patient Active Problem List   Diagnosis Date Noted  . Cancer of left female breast ([place]) 08/29/2020  . Port-A-Cath in place 03/22/2020  . Malignant neoplasm of upper-inner quadrant of left breast in female, estrogen receptor negative ([place]) 02/04/2020  . Metaplastic carcinoma of breast ([place]) 02/04/2020  . Overweight (BMI 25.0-29.9) 06/06/2016  . Hypokalemia 06/06/2016  . S/P left TKA 06/05/2016    Claris Pong 10/28/2020, 9:54 AM  [place], [place], [address] Phone: [phone]   Fax:  [phone]  Name: Denell Cothern MRN: [id number] Date of Birth: 08/18/1945 Cheral Almas, PT 10/28/20 9:55 AM

## 2020-10-31 ENCOUNTER — Ambulatory Visit: Admission: RE | Admit: 2020-10-31 | Payer: Medicare Other | Source: Ambulatory Visit | Admitting: Radiation Oncology

## 2020-10-31 ENCOUNTER — Other Ambulatory Visit: Payer: Self-pay

## 2020-10-31 ENCOUNTER — Ambulatory Visit: Payer: Medicare Other

## 2020-11-01 ENCOUNTER — Ambulatory Visit: Payer: Medicare Other

## 2020-11-02 ENCOUNTER — Ambulatory Visit: Payer: Medicare Other

## 2020-11-03 ENCOUNTER — Ambulatory Visit: Payer: Medicare Other

## 2020-11-03 ENCOUNTER — Encounter: Payer: Self-pay | Admitting: Rehabilitation

## 2020-11-03 ENCOUNTER — Other Ambulatory Visit: Payer: Self-pay

## 2020-11-03 ENCOUNTER — Ambulatory Visit: Payer: Self-pay | Admitting: Rehabilitation

## 2020-11-03 DIAGNOSIS — C50312 Malignant neoplasm of lower-inner quadrant of left female breast: Secondary | ICD-10-CM

## 2020-11-03 DIAGNOSIS — M4004 Postural kyphosis, thoracic region: Secondary | ICD-10-CM

## 2020-11-03 DIAGNOSIS — M25612 Stiffness of left shoulder, not elsewhere classified: Secondary | ICD-10-CM

## 2020-11-03 DIAGNOSIS — R293 Abnormal posture: Secondary | ICD-10-CM | POA: Diagnosis not present

## 2020-11-03 DIAGNOSIS — Z483 Aftercare following surgery for neoplasm: Secondary | ICD-10-CM

## 2020-11-03 NOTE — Therapy (Signed)
[place], [place], [address] Phone: [phone]   Fax:  [phone]  Physical Therapy Treatment  Patient Details  Name: Donna [name] MRN: [id number] Date of Birth: December 26, 1945 Referring Provider (PT): Dr. Marlou Starks   Encounter Date: 11/03/2020   PT End of Session - 11/03/20 1557    Visit Number 5    Number of Visits 12    Date for PT Re-Evaluation 11/23/20    PT Start Time 1500    PT Stop Time 1553    PT Time Calculation (min) 53 min    Activity Tolerance Patient tolerated treatment well    Behavior During Therapy [facility] for tasks assessed/performed           Past Medical History:  Diagnosis Date  . Arthritis    oa  . History of kidney stones   . Hypertension     Past Surgical History:  Procedure Laterality Date  . CYSTOSCOPY  yrs ago  . IR IMAGING GUIDED PORT INSERTION  02/16/2020  . MASTECTOMY W/ SENTINEL NODE BIOPSY Left 08/29/2020   Procedure: LEFT MASTECTOMY WITH SENTINEL LYMPH NODE BIOPSY;  Surgeon: [name], MD;  Location: [place];  Service: General;  Laterality: Left;  RNFA, PEC BLOCK  . TOTAL KNEE ARTHROPLASTY Left 06/05/2016   Procedure: LEFT TOTAL KNEE ARTHROPLASTY;  Surgeon: [name], MD;  [facility];  Service: Orthopedics;  Laterality: Left;    There were no vitals filed for this visit.   Subjective Assessment - 11/03/20 1458    Subjective I could not do the radiation simulation because of the motion.    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  She is presently staying in town with her children but is looking forward to going home to [place].    Currently in Pain? No/denies                             [facility] Adult PT Treatment/Exercise - 11/03/20 0001      Exercises   Exercises Shoulder      Shoulder Exercises: Supine   Other Supine Exercises used  ranger for cane flexion and ER but ER was difficult due to substitution with elbow extension and pt had trouble stopping this      Shoulder Exercises: Seated   Other Seated Exercises ranger flexion and scaption x 5 each on high setting pt seated on table      Manual Therapy   Joint Mobilization GH AP and inf glides at neutral and end range abduction Grade 2 and IV-, long axis distraction after certain motions    Scapular Mobilization Right SL scap mobs    Passive ROM to the Rt shoulder into flexion, abduction, ER, and combined movements with sig vcs for relaxation and to not assist with motion                       PT Long Term Goals - 10/12/20 1658      PT LONG TERM GOAL #1   Title Pt will be independent and compliant with a HEP for left shoulder ROM/strength    Time 4    Period Weeks    Status New    Target Date 11/16/20      PT LONG TERM GOAL #2   Title Pts PROM/AA ROM left shoulder improved to  130 deg  flexion    Time 4    Period Weeks    Status New      PT LONG TERM GOAL #3   Title Pt able to get her arm into better position for radiation    Time 3    Period Weeks    Status New    Target Date 10/31/20                 Plan - 11/03/20 1558    Clinical Impression Statement Pt was unable to perform radiation simulation due to shoulder stiffness.  Pt with extreme lack of ER at all angles of abduction and difficulty performing PROM/AAROM due to trouble relaxing fully.  Very firm end feel.    PT Frequency 2x / week    PT Duration 6 weeks    PT Treatment/Interventions Therapeutic exercise;Manual techniques;Patient/family education;Manual lymph drainage;Passive range of motion;Joint Manipulations    PT Next Visit Plan [place] Joint mobs/scap. mobs left shoulder, increase ROM, could possible do flexion pulley    PT Home Exercise Plan 3 post op exs excluding wall walk, flex and ER done in supine    Consulted and Agree with Plan of Care Patient            Patient will benefit from skilled therapeutic intervention in order to improve the following deficits and impairments:     Visit Diagnosis: Abnormal posture  Aftercare following surgery for neoplasm  Stiffness of left shoulder, not elsewhere classified  Malignant neoplasm of lower-inner quadrant of left breast in female, estrogen receptor negative (HCC)  Postural kyphosis of thoracic region     Problem List Patient Active Problem List   Diagnosis Date Noted  . Cancer of left female breast ([place]) 08/29/2020  . Port-A-Cath in place 03/22/2020  . Malignant neoplasm of upper-inner quadrant of left breast in female, estrogen receptor negative ([place]) 02/04/2020  . 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 Overweight (BMI 25.0-29.9) 06/06/2016  . Hypokalemia 06/06/2016  . S/P left TKA 06/05/2016    Stark Bray 11/03/2020, 4:00 PM  [place], [place], [address] Phone: [phone]   Fax:  [phone]  Name: Donna [name] MRN: [id number] Date of Birth: February 20, 1946

## 2020-11-04 ENCOUNTER — Encounter: Payer: Self-pay | Admitting: *Deleted

## 2020-11-04 ENCOUNTER — Ambulatory Visit: Payer: Medicare Other

## 2020-11-08 ENCOUNTER — Ambulatory Visit: Payer: Medicare Other | Admitting: Radiation Oncology

## 2020-11-08 ENCOUNTER — Ambulatory Visit: Payer: Medicare Other

## 2020-11-09 ENCOUNTER — Ambulatory Visit: Payer: Medicare Other

## 2020-11-09 ENCOUNTER — Ambulatory Visit: Payer: Medicare Other | Admitting: Radiation Oncology

## 2020-11-10 ENCOUNTER — Ambulatory Visit: Payer: Medicare Other

## 2020-11-10 ENCOUNTER — Other Ambulatory Visit: Payer: Self-pay

## 2020-11-10 ENCOUNTER — Ambulatory Visit: Payer: Medicare Other | Attending: Radiation Oncology

## 2020-11-10 ENCOUNTER — Ambulatory Visit: Payer: Medicare Other | Admitting: Radiation Oncology

## 2020-11-10 DIAGNOSIS — M4004 Postural kyphosis, thoracic region: Secondary | ICD-10-CM | POA: Insufficient documentation

## 2020-11-10 DIAGNOSIS — Z171 Estrogen receptor negative status [ER-]: Secondary | ICD-10-CM | POA: Diagnosis present

## 2020-11-10 DIAGNOSIS — Z483 Aftercare following surgery for neoplasm: Secondary | ICD-10-CM | POA: Diagnosis present

## 2020-11-10 DIAGNOSIS — C50312 Malignant neoplasm of lower-inner quadrant of left female breast: Secondary | ICD-10-CM | POA: Insufficient documentation

## 2020-11-10 DIAGNOSIS — M25612 Stiffness of left shoulder, not elsewhere classified: Secondary | ICD-10-CM | POA: Diagnosis present

## 2020-11-10 DIAGNOSIS — R293 Abnormal posture: Secondary | ICD-10-CM | POA: Diagnosis present

## 2020-11-10 NOTE — Therapy (Signed)
[place], [place], [address] Phone: [phone]   Fax:  [phone]  Physical Therapy Treatment  Patient Details  Name: Donna [name] MRN: [id number] Date of Birth: 1945-09-21 Referring Provider (PT): Dr. Marlou Starks   Encounter Date: 11/10/2020   PT End of Session - 11/10/20 1659    Visit Number 6    Number of Visits 12    Date for PT Re-Evaluation 11/23/20    PT Start Time 1606    PT Stop Time 1650    PT Time Calculation (min) 44 min    Activity Tolerance Patient tolerated treatment well    Behavior During Therapy [facility] for tasks assessed/performed           Past Medical History:  Diagnosis Date  . Arthritis    oa  . History of kidney stones   . Hypertension     Past Surgical History:  Procedure Laterality Date  . CYSTOSCOPY  yrs ago  . IR IMAGING GUIDED PORT INSERTION  02/16/2020  . MASTECTOMY W/ SENTINEL NODE BIOPSY Left 08/29/2020   Procedure: LEFT MASTECTOMY WITH SENTINEL LYMPH NODE BIOPSY;  Surgeon: [name], MD;  Location: [place];  Service: General;  Laterality: Left;  RNFA, PEC BLOCK  . TOTAL KNEE ARTHROPLASTY Left 06/05/2016   Procedure: LEFT TOTAL KNEE ARTHROPLASTY;  Surgeon: [name], MD;  [facility];  Service: Orthopedics;  Laterality: Left;    There were no vitals filed for this visit.   Subjective Assessment - 11/10/20 1606    Subjective I did well after last visit, but when I get up in the am it is stiff. No increased pain after last visit.  Gets sore to sleep on the left side.    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  She is presently staying in town with her children but is looking forward to going home to [place].    Currently in Pain? No/denies                             [facility] Adult PT Treatment/Exercise - 11/10/20 0001      Shoulder Exercises:  Supine   Other Supine Exercises AA wand flex,scaption x 5, star gazer x 5      Manual Therapy   Manual Therapy Joint mobilization;Scapular mobilization;Passive ROM;Manual Traction    Joint Mobilization GH AP and inf glides at neutral and end range abduction Grade 2 and IV-, long axis distraction after certain motions    Scapular Mobilization Right SL scap mobs    Passive ROM PROM/AA ROM to left shoulder flex, scaption, D2 flex,ER with VCs to relax                       PT Long Term Goals - 10/12/20 1658      PT LONG TERM GOAL #1   Title Pt will be independent and compliant with a HEP for left shoulder ROM/strength    Time 4    Period Weeks    Status New    Target Date 11/16/20      PT LONG TERM GOAL #2   Title Pts PROM/AA ROM left shoulder improved to 130 deg  flexion    Time 4    Period Weeks    Status New      PT LONG TERM GOAL #3  Title Pt able to get her arm into better position for radiation    Time 3    Period Weeks    Status New    Target Date 10/31/20                 Plan - 11/10/20 1700    Clinical Impression Statement Pt continues with significant limitations in left shoulder ROM with very firm end feels and scapular compensation.  Pt has good tolerance for PROM but crepitus can be felt with certain motions. Pt has no increased complaints of pain    Personal Factors and Comorbidities Age;Comorbidity 2;Comorbidity 3+    Comorbidities age, recent Cancer diagnosis Triple negative,Left shoulder limitations in AROM pre surgery    Examination-Activity Limitations Reach Overhead;Dressing    Stability/Clinical Decision Making Stable/Uncomplicated    Rehab Potential Good    PT Frequency 2x / week    PT Duration 6 weeks    PT Treatment/Interventions Therapeutic exercise;Manual techniques;Patient/family education;Manual lymph drainage;Passive range of motion;Joint Manipulations    PT Next Visit Plan [place] Joint mobs/scap. mobs left shoulder, increase ROM,  could possible do flexion pulley    PT Home Exercise Plan 3 post op exs excluding wall walk, flex and ER done in supine    Consulted and Agree with Plan of Care Patient           Patient will benefit from skilled therapeutic intervention in order to improve the following deficits and impairments:  Decreased knowledge of precautions,Decreased range of motion,Decreased strength,Impaired UE functional use,Postural dysfunction  Visit Diagnosis: Abnormal posture  Aftercare following surgery for neoplasm  Stiffness of left shoulder, not elsewhere classified  Malignant neoplasm of lower-inner quadrant of left breast in female, estrogen receptor negative (HCC)  Postural kyphosis of thoracic region     Problem List Patient Active Problem List   Diagnosis Date Noted  . Cancer of left female breast ([place]) 08/29/2020  . Port-A-Cath in place 03/22/2020  . Malignant neoplasm of upper-inner quadrant of left breast in female, estrogen receptor negative ([place]) 02/04/2020  . 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 Overweight (BMI 25.0-29.9) 06/06/2016  . Hypokalemia 06/06/2016  . S/P left TKA 06/05/2016    Claris Pong 11/10/2020, 5:03 PM  [place], [place], [address] Phone: [phone]   Fax:  [phone]  Name: Donna [name] MRN: [id number] Date of Birth: 1946-05-02  Cheral Almas, PT 11/10/20 5:04 PM

## 2020-11-11 ENCOUNTER — Ambulatory Visit: Payer: Medicare Other

## 2020-11-14 ENCOUNTER — Ambulatory Visit: Payer: Medicare Other

## 2020-11-14 ENCOUNTER — Ambulatory Visit: Payer: Medicare Other | Admitting: Radiation Oncology

## 2020-11-14 ENCOUNTER — Other Ambulatory Visit: Payer: Self-pay

## 2020-11-14 DIAGNOSIS — M25612 Stiffness of left shoulder, not elsewhere classified: Secondary | ICD-10-CM

## 2020-11-14 DIAGNOSIS — C50312 Malignant neoplasm of lower-inner quadrant of left female breast: Secondary | ICD-10-CM

## 2020-11-14 DIAGNOSIS — M4004 Postural kyphosis, thoracic region: Secondary | ICD-10-CM

## 2020-11-14 DIAGNOSIS — R293 Abnormal posture: Secondary | ICD-10-CM | POA: Diagnosis not present

## 2020-11-14 DIAGNOSIS — Z483 Aftercare following surgery for neoplasm: Secondary | ICD-10-CM

## 2020-11-14 NOTE — Therapy (Signed)
[place], [place], [address] Phone: [phone]   Fax:  [phone]  Physical Therapy Treatment  Patient Details  Name: Donna [name] MRN: [id number] Date of Birth: 1946-06-10 Referring Provider (PT): Dr. Marlou Starks   Encounter Date: 11/14/2020   PT End of Session - 11/14/20 0908    Visit Number 7    Number of Visits 12    Date for PT Re-Evaluation 11/23/20    PT Start Time 0807    PT Stop Time 0901    PT Time Calculation (min) 54 min    Activity Tolerance Patient tolerated treatment well    Behavior During Therapy [facility] for tasks assessed/performed           Past Medical History:  Diagnosis Date  . Arthritis    oa  . History of kidney stones   . Hypertension     Past Surgical History:  Procedure Laterality Date  . CYSTOSCOPY  yrs ago  . IR IMAGING GUIDED PORT INSERTION  02/16/2020  . MASTECTOMY W/ SENTINEL NODE BIOPSY Left 08/29/2020   Procedure: LEFT MASTECTOMY WITH SENTINEL LYMPH NODE BIOPSY;  Surgeon: [name], MD;  Location: [place];  Service: General;  Laterality: Left;  RNFA, PEC BLOCK  . TOTAL KNEE ARTHROPLASTY Left 06/05/2016   Procedure: LEFT TOTAL KNEE ARTHROPLASTY;  Surgeon: [name], MD;  [facility];  Service: Orthopedics;  Laterality: Left;    There were no vitals filed for this visit.   Subjective Assessment - 11/14/20 0812    Subjective My Lt shoulder feels a little stiff this morning but I haven't had a chance to do my exercises yet. They seem to be helping and I feel good after I leave here.    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  She is presently staying in town with her children but is looking forward to going home to [place].    Patient Stated Goals Get arm up enough to have radiation    Currently in Pain? No/denies                              [facility] Adult PT Treatment/Exercise - 11/14/20 0001      Manual Therapy   Manual Therapy Joint mobilization;Scapular mobilization;Passive ROM;Soft tissue mobilization    Joint Mobilization GH AP and inf glides at neutral and end range abduction Grade 2 and IV-, long axis distraction after certain motions    Soft tissue mobilization With coca butter to Lt anterior shoulder and at pectoralis insertion where palpable tightness present.    Scapular Mobilization Right S/L for Lt scap mobs    Passive ROM PROM/AA ROM to left shoulder flex, scaption, D2 flex,ER with VCs to relax                       PT Long Term Goals - 10/12/20 1658      PT LONG TERM GOAL #1   Title Pt will be independent and compliant with a HEP for left shoulder ROM/strength    Time 4    Period Weeks    Status New    Target Date 11/16/20      PT LONG TERM GOAL #2   Title Pts PROM/AA ROM left shoulder improved to 130 deg  flexion    Time 4    Period  Weeks    Status New      PT LONG TERM GOAL #3   Title Pt able to get her arm into better position for radiation    Time 3    Period Weeks    Status New    Target Date 10/31/20                 Plan - 11/14/20 0908    Clinical Impression Statement Pt continues with significant limitations in left shoulder with firm end feels and scapular compensations. Some improvement was noted by end of session with her end P/ROM into flexionbeing improved abd pt reports this feeling looser as well. She tolerates fairly aggressive stretching without increased discomfort or pain, just reports feeling good stretch at end motions.    Personal Factors and Comorbidities Age;Comorbidity 2;Comorbidity 3+    Comorbidities age, recent Cancer diagnosis Triple negative,Left shoulder limitations in AROM pre surgery    Examination-Activity Limitations Reach Overhead;Dressing    Stability/Clinical Decision Making Stable/Uncomplicated    PT Frequency 2x / week    PT Duration 6 weeks     PT Treatment/Interventions Therapeutic exercise;Manual techniques;Patient/family education;Manual lymph drainage;Passive range of motion;Joint Manipulations    PT Next Visit Plan [place] Joint mobs/scap. mobs left shoulder, increase ROM, could possible do flexion pulley    PT Home Exercise Plan 3 post op exs excluding wall walk, flex and ER done in supine    Consulted and Agree with Plan of Care Patient           Patient will benefit from skilled therapeutic intervention in order to improve the following deficits and impairments:  Decreased knowledge of precautions,Decreased range of motion,Decreased strength,Impaired UE functional use,Postural dysfunction  Visit Diagnosis: Abnormal posture  Aftercare following surgery for neoplasm  Stiffness of left shoulder, not elsewhere classified  Malignant neoplasm of lower-inner quadrant of left breast in female, estrogen receptor negative (HCC)  Postural kyphosis of thoracic region     Problem List Patient Active Problem List   Diagnosis Date Noted  . Cancer of left female breast ([place]) 08/29/2020  . Port-A-Cath in place 03/22/2020  . Malignant neoplasm of upper-inner quadrant of left breast in female, estrogen receptor negative ([place]) 02/04/2020  . 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 Overweight (BMI 25.0-29.9) 06/06/2016  . Hypokalemia 06/06/2016  . S/P left TKA 06/05/2016    Donna [name], PTA 11/14/2020, 9:11 AM  [place], [place], [address] Phone: [phone]   Fax:  [phone]  Name: Donna [name] MRN: [id number] Date of Birth: [birth year]

## 2020-11-15 ENCOUNTER — Ambulatory Visit: Payer: Medicare Other

## 2020-11-16 ENCOUNTER — Ambulatory Visit: Payer: Medicare Other

## 2020-11-16 ENCOUNTER — Other Ambulatory Visit: Payer: Self-pay

## 2020-11-16 ENCOUNTER — Ambulatory Visit: Payer: Medicare Other | Admitting: Radiation Oncology

## 2020-11-16 DIAGNOSIS — Z171 Estrogen receptor negative status [ER-]: Secondary | ICD-10-CM

## 2020-11-16 DIAGNOSIS — C50312 Malignant neoplasm of lower-inner quadrant of left female breast: Secondary | ICD-10-CM

## 2020-11-16 DIAGNOSIS — R293 Abnormal posture: Secondary | ICD-10-CM

## 2020-11-16 DIAGNOSIS — M25612 Stiffness of left shoulder, not elsewhere classified: Secondary | ICD-10-CM

## 2020-11-16 DIAGNOSIS — Z483 Aftercare following surgery for neoplasm: Secondary | ICD-10-CM

## 2020-11-16 DIAGNOSIS — M4004 Postural kyphosis, thoracic region: Secondary | ICD-10-CM

## 2020-11-16 NOTE — Therapy (Signed)
[place], [place], [address] Phone: [phone]   Fax:  [phone]  Physical Therapy Treatment  Patient Details  Name: Donna [name] MRN: [id number] Date of Birth: 29-May-1946 Referring Provider (PT): Dr. Marlou Starks   Encounter Date: 11/16/2020   PT End of Session - 11/16/20 0838    Visit Number 8    Number of Visits 12    Date for PT Re-Evaluation 11/23/20    PT Start Time 0808    PT Stop Time 0855    PT Time Calculation (min) 47 min    Activity Tolerance Patient tolerated treatment well    Behavior During Therapy [facility] for tasks assessed/performed           Past Medical History:  Diagnosis Date  . Arthritis    oa  . History of kidney stones   . Hypertension     Past Surgical History:  Procedure Laterality Date  . CYSTOSCOPY  yrs ago  . IR IMAGING GUIDED PORT INSERTION  02/16/2020  . MASTECTOMY W/ SENTINEL NODE BIOPSY Left 08/29/2020   Procedure: LEFT MASTECTOMY WITH SENTINEL LYMPH NODE BIOPSY;  Surgeon: [name], MD;  Location: [place];  Service: General;  Laterality: Left;  RNFA, PEC BLOCK  . TOTAL KNEE ARTHROPLASTY Left 06/05/2016   Procedure: LEFT TOTAL KNEE ARTHROPLASTY;  Surgeon: [name], MD;  [facility];  Service: Orthopedics;  Laterality: Left;    There were no vitals filed for this visit.   Subjective Assessment - 11/16/20 0810    Subjective Sore after last visit but a few hours later it was fine.  Its a little sore this morning , but no pain. Seems like I feel a little change.    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  She is presently staying in town with her children but is looking forward to going home to [place].    Patient Stated Goals Get arm up enough to have radiation    Currently in Pain? No/denies                             [facility] Adult PT  Treatment/Exercise - 11/16/20 0001      Shoulder Exercises: Supine   Other Supine Exercises AA wand flex,scaption x 5, star gazer x 5      Manual Therapy   Joint Mobilization GH AP and inf glides at neutral and end range abduction Grade 2 and IV-, long axis distraction after certain motions    Soft tissue mobilization With coca butter to Lt anterior shoulder and at pectoralis insertion and in SL to lats and scapular region where palpable tightness present.    Scapular Mobilization Right S/L for Lt scap mobs    Passive ROM PROM/AA ROM to left shoulder flex, scaption, D2 flex,ER with VCs to relax                       PT Long Term Goals - 10/12/20 1658      PT LONG TERM GOAL #1   Title Pt will be independent and compliant with a HEP for left shoulder ROM/strength    Time 4    Period Weeks    Status New    Target Date 11/16/20      PT LONG TERM GOAL #2   Title Pts PROM/AA ROM left  shoulder improved to 130 deg  flexion    Time 4    Period Weeks    Status New      PT LONG TERM GOAL #3   Title Pt able to get her arm into better position for radiation    Time 3    Period Weeks    Status New    Target Date 10/31/20                 Plan - 11/16/20 0901    Clinical Impression Statement Pt felt good stretch with standing lat stretch at counter.  Initiated Soft tissue mobilization to pecs, lats scapular region to see if that would help with motion. Continued mobilizations of GH joint and scapula.  Crepitus still felt in [facility] joint with ER PROM.  Firm end feels and limitations continue.  Pt has next simulation on June 15.           Patient will benefit from skilled therapeutic intervention in order to improve the following deficits and impairments:  Decreased knowledge of precautions,Decreased range of motion,Decreased strength,Impaired UE functional use,Postural dysfunction  Visit Diagnosis: Abnormal posture  Aftercare following surgery for neoplasm  Stiffness  of left shoulder, not elsewhere classified  Malignant neoplasm of lower-inner quadrant of left breast in female, estrogen receptor negative ([place])  Postural kyphosis of thoracic region     Problem List Patient Active Problem List   Diagnosis Date Noted  . Cancer of left female breast ([place]) 08/29/2020  . Port-A-Cath in place 03/22/2020  . Malignant neoplasm of upper-inner quadrant of left breast in female, estrogen receptor negative ([place]) 02/04/2020  . 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 Overweight (BMI 25.0-29.9) 06/06/2016  . Hypokalemia 06/06/2016  . S/P left TKA 06/05/2016    Claris Pong 11/16/2020, 9:14 AM  [place], [place], [address] Phone: [phone]   Fax:  [phone]  Name: Donna [name] MRN: [id number] Date of Birth: 09/28/1945 Cheral Almas, PT 11/16/20 9:15 AM

## 2020-11-17 ENCOUNTER — Ambulatory Visit: Payer: Medicare Other

## 2020-11-17 ENCOUNTER — Ambulatory Visit: Payer: Medicare Other | Admitting: Radiation Oncology

## 2020-11-18 ENCOUNTER — Ambulatory Visit: Payer: Medicare Other

## 2020-11-21 ENCOUNTER — Ambulatory Visit: Payer: Medicare Other

## 2020-11-21 ENCOUNTER — Ambulatory Visit: Payer: Medicare Other | Admitting: Radiation Oncology

## 2020-11-22 ENCOUNTER — Ambulatory Visit: Payer: Medicare Other

## 2020-11-23 ENCOUNTER — Ambulatory Visit
Admission: RE | Admit: 2020-11-23 | Discharge: 2020-11-23 | Disposition: A | Discharge status: Home / Self Care | Payer: Medicare Other | Source: Ambulatory Visit | Attending: Radiation Oncology | Admitting: Radiation Oncology

## 2020-11-23 ENCOUNTER — Ambulatory Visit: Payer: Medicare Other

## 2020-11-23 ENCOUNTER — Other Ambulatory Visit: Payer: Self-pay

## 2020-11-23 DIAGNOSIS — Z171 Estrogen receptor negative status [ER-]: Secondary | ICD-10-CM

## 2020-11-23 DIAGNOSIS — C50312 Malignant neoplasm of lower-inner quadrant of left female breast: Secondary | ICD-10-CM

## 2020-11-23 DIAGNOSIS — Z510 Encounter for antineoplastic radiation therapy: Secondary | ICD-10-CM | POA: Diagnosis present

## 2020-11-23 DIAGNOSIS — M25612 Stiffness of left shoulder, not elsewhere classified: Secondary | ICD-10-CM

## 2020-11-23 DIAGNOSIS — Z483 Aftercare following surgery for neoplasm: Secondary | ICD-10-CM

## 2020-11-23 DIAGNOSIS — C50212 Malignant neoplasm of upper-inner quadrant of left female breast: Secondary | ICD-10-CM | POA: Insufficient documentation

## 2020-11-23 DIAGNOSIS — R293 Abnormal posture: Secondary | ICD-10-CM | POA: Diagnosis not present

## 2020-11-23 NOTE — Therapy (Signed)
[place], [place], [address] Phone: [phone]   Fax:  [phone]  Physical Therapy Treatment  Patient Details  Name: Donna [name] MRN: [id number] Date of Birth: April 13, 1946 Referring Provider (PT): Dr. Marlou [name]   Encounter Date: 11/23/2020   PT End of Session - 11/23/20 0859     Visit Number 9    Number of Visits 12    Date for PT Re-Evaluation 01/04/21    PT Start Time 0806    PT Stop Time 2800    PT Time Calculation (min) 51 min    Activity Tolerance Patient tolerated treatment well    Behavior During Therapy [facility] for tasks assessed/performed             Past Medical History:  Diagnosis Date   Arthritis    oa   History of kidney stones    Hypertension     Past Surgical History:  Procedure Laterality Date   CYSTOSCOPY  yrs ago   IR IMAGING GUIDED PORT INSERTION  02/16/2020   MASTECTOMY W/ SENTINEL NODE BIOPSY Left 08/29/2020   Procedure: LEFT MASTECTOMY WITH SENTINEL LYMPH NODE BIOPSY;  Surgeon: Donna Kussmaul, MD;  Location: [place];  Service: General;  Laterality: Left;  RNFA, PEC BLOCK   TOTAL KNEE ARTHROPLASTY Left 06/05/2016   Procedure: LEFT TOTAL KNEE ARTHROPLASTY;  Surgeon: Donna Cancel, MD;  [facility];  Service: Orthopedics;  Laterality: Left;    There were no vitals filed for this visit.   Subjective Assessment - 11/23/20 0812     Subjective A little sore after last visit but not bad and it didn't last long.  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  She is presently staying in town with her children but is looking forward to going home to [place].    Patient Stated Goals Get arm up enough to have radiation    Currently in Pain? No/denies    Multiple Pain Sites No                [facility] PT  Assessment - 11/23/20 0001       PROM   Left Shoulder Flexion 130 Degrees    Left Shoulder ABduction 138 Degrees   scaption   Left Shoulder External Rotation 8 Degrees                           [facility] Adult PT Treatment/Exercise - 11/23/20 0001       Shoulder Exercises: Supine   Other Supine Exercises AA wand flex,scaption x 5, star gazer x 5 with overpressure by PT    Other Supine Exercises supine and SL left arm circles x 10 ea direction, supine ABCs A- G with assist by PT      Manual Therapy   Manual therapy comments flex 120/130, scaption 138 P, ER at 45 deg 8    Joint Mobilization GH AP and inf glides at neutral and end range abduction Grade 2/3 and IV-, long axis distraction after certain motions    Scapular Mobilization Right S/L for Lt scap mobs    Passive ROM PROM/AA ROM to left shoulder flex, scaption, D2 flex,ER with VCs to relax  PT Long Term Goals - 11/23/20 0953       PT LONG TERM GOAL #1   Title Pt will be independent and compliant with a HEP for left shoulder ROM/strength    Baseline compliant but requires assist in PT to do correctly    Time 4    Period Weeks    Status Achieved      PT LONG TERM GOAL #2   Baseline achieved today, but varies and only after stretching    Period Weeks    Status Partially Met    Target Date 01/04/21      PT LONG TERM GOAL #3   Title Pt able to get her arm into better position for radiation    Time 3    Period Weeks    Status On-going    Target Date 01/04/21                   Plan - 11/23/20 0946     Clinical Impression Statement Pt is scheduled for radiation simulation today.  Therapy consisted of GH and scapular mobilizations, Passive and AAROM exercises to restore function, and supine and SL AROM exs with PT assist for positioning.  Pt  has difficulty relaxing and requires frequent VC's.  ROM is always slighlty improved at completion of therapy but pt  always comes in very stiff.  She needs assist to do exercises correctly.  She will have difficulty with radiation simulation presently unless they are able to modify her position.    Personal Factors and Comorbidities Age;Comorbidity 2;Comorbidity 3+    Comorbidities age, recent Cancer diagnosis Triple negative,Left shoulder limitations in AROM pre surgery    Examination-Activity Limitations Reach Overhead;Dressing    Stability/Clinical Decision Making Stable/Uncomplicated    Rehab Potential Good    PT Frequency 2x / week    PT Duration 6 weeks    PT Treatment/Interventions Therapeutic exercise;Manual techniques;Patient/family education;Manual lymph drainage;Passive range of motion;Joint Manipulations    PT Next Visit Plan SOZO next week or so unless already [place] mobs/scap. mobs left shoulder, increase ROM,counter stretch, could possible do flexion pulley    PT Home Exercise Plan 3 post op exs excluding wall walk, flex and ER done in supine    Consulted and Agree with Plan of Care Patient    Family Member Consulted son Donna [name]             Patient will benefit from skilled therapeutic intervention in order to improve the following deficits and impairments:  Decreased knowledge of precautions, Decreased range of motion, Decreased strength, Impaired UE functional use, Postural dysfunction  Visit Diagnosis: Aftercare following surgery for neoplasm  Stiffness of left shoulder, not elsewhere classified  Malignant neoplasm of lower-inner quadrant of left breast in female, estrogen receptor negative ([place])     Problem List Patient Active Problem List   Diagnosis Date Noted   Cancer of left female breast ([place]) 08/29/2020   Port-A-Cath in place 03/22/2020   Malignant neoplasm of upper-inner quadrant of left breast in female, estrogen receptor negative ([place]) 02/04/2020   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

## 2020-11-24 ENCOUNTER — Ambulatory Visit: Payer: Medicare Other

## 2020-11-25 ENCOUNTER — Other Ambulatory Visit: Payer: Self-pay

## 2020-11-25 ENCOUNTER — Ambulatory Visit: Payer: Medicare Other

## 2020-11-25 ENCOUNTER — Ambulatory Visit: Payer: Medicare Other | Admitting: Physical Therapy

## 2020-11-25 DIAGNOSIS — M4004 Postural kyphosis, thoracic region: Secondary | ICD-10-CM

## 2020-11-25 DIAGNOSIS — R293 Abnormal posture: Secondary | ICD-10-CM | POA: Diagnosis not present

## 2020-11-25 DIAGNOSIS — Z483 Aftercare following surgery for neoplasm: Secondary | ICD-10-CM

## 2020-11-25 DIAGNOSIS — M25612 Stiffness of left shoulder, not elsewhere classified: Secondary | ICD-10-CM

## 2020-11-25 NOTE — Therapy (Signed)
[place], [place], [address] Phone: [phone]   Fax:  [phone]  Physical Therapy Treatment  Patient Details  Name: Donna [name] MRN: [id number] Date of Birth: Jul 31, 1945 Referring Provider (PT): Dr. Toth/Dr. Isidore Moos  Progress Note Reporting Period 05/04/2020 to 11/25/2020  See note below for Objective Data and Assessment of Progress/Goals.     Encounter Date: 11/25/2020   PT End of Session - 11/25/20 1230     Visit Number 10    Number of Visits 12    Date for PT Re-Evaluation 01/04/21    PT Start Time 0815    PT Stop Time 8101    PT Time Calculation (min) 40 min    Activity Tolerance Patient tolerated treatment well    Behavior During Therapy [facility] for tasks assessed/performed             Past Medical History:  Diagnosis Date   Arthritis    oa   History of kidney stones    Hypertension     Past Surgical History:  Procedure Laterality Date   CYSTOSCOPY  yrs ago   IR IMAGING GUIDED PORT INSERTION  02/16/2020   MASTECTOMY W/ SENTINEL NODE BIOPSY Left 08/29/2020   Procedure: LEFT MASTECTOMY WITH SENTINEL LYMPH NODE BIOPSY;  Surgeon: [name], MD;  Location: [place];  Service: General;  Laterality: Left;  RNFA, PEC BLOCK   TOTAL KNEE ARTHROPLASTY Left 06/05/2016   Procedure: LEFT TOTAL KNEE ARTHROPLASTY;  Surgeon: [name], MD;  [facility];  Service: Orthopedics;  Laterality: Left;    There were no vitals filed for this visit.   Subjective Assessment - 11/25/20 0809     Subjective Pt said she was able to get her arm for radiation. 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  She is presently staying in town with her children but is looking forward to going home to [place].    Patient Stated Goals Get arm up enough to have radiation     Currently in Pain? No/denies                               [facility] Adult PT Treatment/Exercise - 11/25/20 0001       Exercises   Exercises Shoulder      Shoulder Exercises: Seated   Other Seated Exercises neck and upper thoracic ROM, right arm eleation follwed by attmepts for left , but pt still with signifcant shoulder hiking    Other Seated Exercises manual isometrics for shoulder flexion, abduction and extension for muscle activation and relaxation      Shoulder Exercises: Sidelying   External Rotation AROM;Left    External Rotation Limitations much difficulty with this    ABduction Left;5 reps    ABduction Limitations just elevating arm off pillow for gentle muscle activation      Shoulder Exercises: Standing   Flexion AROM;Left;10 reps    Flexion Limitations with hand on UE ranger at comfortable height and pt looking in mirror to keep shoulder down, pt with movment of shoulder in multiple directions      Manual Therapy   Joint Mobilization [place] AP and inf glides at neutral and end range abduction, long axis distraction    Soft tissue mobilization With coca butter to Lt anterior shoulder and  at pectoralis insertion and in SL to lats and scapular region where palpable tightness present. extra work on lats and teres major with softening felt at end of session    Scapular Mobilization Right S/L for Lt scap mobs    Passive ROM PROM/AA ROM to left shoulder flex, scaption, D2 flex,ER with firm end feel . 2 reps of prolonged stretch at end range elevation of shoulder with elbow bent                         PT Long Term Goals - 11/23/20 0953       PT LONG TERM GOAL #1   Title Pt will be independent and compliant with a HEP for left shoulder ROM/strength    Baseline compliant but requires assist in PT to do correctly    Time 4    Period Weeks    Status Achieved      PT LONG TERM GOAL #2   Baseline achieved today, but varies and only after  stretching    Period Weeks    Status Partially Met    Target Date 01/04/21      PT LONG TERM GOAL #3   Title Pt able to get her arm into better position for radiation    Time 3    Period Weeks    Status On-going    Target Date 01/04/21                   Plan - 11/25/20 1231     Clinical Impression Statement Pt continues to have shoulde hiking with active movement and very limited ROM especially in ER.  Some muscle tightness release palpated with soft tissue work. Pt will benefit from continued PT to continue to stretch shoulder for more comfortable radiation and work on normalizing movment patterns with less shoulder hiking    Personal Factors and Comorbidities Age;Comorbidity 2;Comorbidity 3+    Comorbidities age, recent Cancer diagnosis Triple negative,Left shoulder limitations in AROM pre surgery    Examination-Activity Limitations Reach Overhead;Dressing    Stability/Clinical Decision Making Stable/Uncomplicated    Rehab Potential Good    PT Frequency 2x / week    PT Duration 6 weeks    PT Treatment/Interventions Therapeutic exercise;Manual techniques;Patient/family education;Manual lymph drainage;Passive range of motion;Joint Manipulations    PT Next Visit Plan SOZO next week or so unless already [place] Joint mobs/scap. mobs left shoulder, increase ROM,counter stretch, could possible do flexion pulley             Patient will benefit from skilled therapeutic intervention in order to improve the following deficits and impairments:  Decreased knowledge of precautions, Decreased range of motion, Decreased strength, Impaired UE functional use, Postural dysfunction  Visit Diagnosis: Aftercare following surgery for neoplasm  Stiffness of left shoulder, not elsewhere classified  Abnormal posture  Postural kyphosis of thoracic region     Problem List Patient Active Problem List   Diagnosis Date Noted   Cancer of left female breast ([place]) 08/29/2020    Port-A-Cath in place 03/22/2020   Malignant neoplasm of upper-inner quadrant of left breast in female, estrogen receptor negative ([place]) 02/04/2020   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 Owens Shark PT  Norwood Levo 11/25/2020, 12:34 PM  [place], [place], [address] Phone: [phone]  Fax:  [phone]  Name: Donna [name] MRN: [id number] Date of Birth: 20-Sep-1945

## 2020-11-28 ENCOUNTER — Ambulatory Visit: Payer: Medicare Other

## 2020-11-28 ENCOUNTER — Ambulatory Visit: Payer: Medicare Other | Admitting: Radiation Oncology

## 2020-11-29 ENCOUNTER — Ambulatory Visit: Payer: Medicare Other

## 2020-11-29 ENCOUNTER — Other Ambulatory Visit: Payer: Self-pay

## 2020-11-29 DIAGNOSIS — Z483 Aftercare following surgery for neoplasm: Secondary | ICD-10-CM

## 2020-11-29 DIAGNOSIS — R293 Abnormal posture: Secondary | ICD-10-CM | POA: Diagnosis not present

## 2020-11-29 DIAGNOSIS — M4004 Postural kyphosis, thoracic region: Secondary | ICD-10-CM

## 2020-11-29 DIAGNOSIS — C50312 Malignant neoplasm of lower-inner quadrant of left female breast: Secondary | ICD-10-CM

## 2020-11-29 DIAGNOSIS — M25612 Stiffness of left shoulder, not elsewhere classified: Secondary | ICD-10-CM

## 2020-11-29 DIAGNOSIS — Z171 Estrogen receptor negative status [ER-]: Secondary | ICD-10-CM

## 2020-11-29 DIAGNOSIS — Z510 Encounter for antineoplastic radiation therapy: Secondary | ICD-10-CM | POA: Diagnosis not present

## 2020-11-29 NOTE — Therapy (Signed)
[place] [name], [place], [address] Phone: [phone]   Fax:  [phone]  Physical Therapy Treatment  Patient Details  Name: Donna [name] MRN: [id number] Date of Birth: 27-Jul-1945 Referring Provider (PT): Dr. Toth/Dr. Isidore Moos   Encounter Date: 11/29/2020   PT End of Session - 11/29/20 0905     Visit Number 11    Number of Visits 12    Date for PT Re-Evaluation 01/04/21    PT Start Time 0808    PT Stop Time 4132    PT Time Calculation (min) 47 min    Activity Tolerance Patient tolerated treatment well    Behavior During Therapy [place] for tasks assessed/performed             Past Medical History:  Diagnosis Date   Arthritis    oa   History of kidney stones    Hypertension     Past Surgical History:  Procedure Laterality Date   CYSTOSCOPY  yrs ago   IR IMAGING GUIDED PORT INSERTION  02/16/2020   MASTECTOMY W/ SENTINEL NODE BIOPSY Left 08/29/2020   Procedure: LEFT MASTECTOMY WITH SENTINEL LYMPH NODE BIOPSY;  Surgeon: [name], MD;  Location: [place];  Service: General;  Laterality: Left;  RNFA, PEC BLOCK   TOTAL KNEE ARTHROPLASTY Left 06/05/2016   Procedure: LEFT TOTAL KNEE ARTHROPLASTY;  Surgeon: [name], MD;  [facility];  Service: Orthopedics;  Laterality: Left;    There were no vitals filed for this visit.   Subjective Assessment - 11/29/20 0808     Subjective I am a little sore today but I don't know why.  I have to start radiation on Thursday, but I have to go in Wednesday for something.    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  She is presently staying in town with her children but is looking forward to going home to [place].    Patient Stated Goals Get arm up enough to have radiation    Currently in Pain? No/denies                               [facility] Adult  PT Treatment/Exercise - 11/29/20 0001       Shoulder Exercises: Supine   External Rotation Strengthening;Left;15 reps   alternating isometrics IR and ER   Other Supine Exercises AA wand flex,scaption x 5, star gazer x 5 with overpressure by PT    Other Supine Exercises supine and SL left arm circles x 10 ea direction, supine ABCs A- G with assist by PT      Shoulder Exercises: Sidelying   External Rotation AROM;Left;10 reps    External Rotation Limitations very limited ROM with crepitus    ABduction AROM;Left;10 reps    Theraband Level (Shoulder ABduction) --   assist of PT to prevent scapular hiking     Manual Therapy   Joint Mobilization GH AP and inf glides at neutral and end range abduction, long axis distraction    Scapular Mobilization Right S/L for Lt scap mobs    Passive ROM PROM/AA/AROM, ROM to left shoulder flex, scaption, D2 flex,ER with firm end feel but end feel improving slightly with scaption, flex                         PT Long Term  Goals - 11/23/20 0953       PT LONG TERM GOAL #1   Title Pt will be independent and compliant with a HEP for left shoulder ROM/strength    Baseline compliant but requires assist in PT to do correctly    Time 4    Period Weeks    Status Achieved      PT LONG TERM GOAL #2   Baseline achieved today, but varies and only after stretching    Period Weeks    Status Partially Met    Target Date 01/04/21      PT LONG TERM GOAL #3   Title Pt able to get her arm into better position for radiation    Time 3    Period Weeks    Status On-going    Target Date 01/04/21                   Plan - 11/29/20 0908     Clinical Impression Statement Pt is starting to have a slightly capsular end feel now with shoulder flexion and scaption although she is still very limited.  She was able to perform AROM arm circles at 90 degrees flexion in supine today with improved control but continues to fatigue easily.  She works very  hard in therapy and at home.  She will start official radiation on Thursday and will need to continue therapy to be sure she can maintain shoulder ROM so she may complete all treatments.    Personal Factors and Comorbidities Age;Comorbidity 2;Comorbidity 3+    Comorbidities age, recent Cancer diagnosis Triple negative,Left shoulder limitations in AROM pre surgery    Examination-Activity Limitations Reach Overhead;Dressing    Stability/Clinical Decision Making Stable/Uncomplicated    Rehab Potential Good    PT Frequency 2x / week    PT Duration 6 weeks    PT Treatment/Interventions Therapeutic exercise;Manual techniques;Patient/family education;Manual lymph drainage;Passive range of motion;Joint Manipulations    PT Next Visit Plan SOZO next week or so unless already [place] mobs/scap. mobs left shoulder, increase ROM,counter stretch, could possible do flexion pulley    Consulted and Agree with Plan of Care Patient             Patient will benefit from skilled therapeutic intervention in order to improve the following deficits and impairments:  Decreased knowledge of precautions, Decreased range of motion, Decreased strength, Impaired UE functional use, Postural dysfunction  Visit Diagnosis: Aftercare following surgery for neoplasm  Stiffness of left shoulder, not elsewhere classified  Abnormal posture  Postural kyphosis of thoracic region  Malignant neoplasm of lower-inner quadrant of left breast in female, estrogen receptor negative ([place])     Problem List Patient Active Problem List   Diagnosis Date Noted   Cancer of left female breast ([place]) 08/29/2020   Port-A-Cath in place 03/22/2020   Malignant neoplasm of upper-inner quadrant of left breast in female, estrogen receptor negative ([place]) 02/04/2020   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

## 2020-11-30 ENCOUNTER — Ambulatory Visit: Admission: RE | Admit: 2020-11-30 | Payer: Medicare Other | Source: Ambulatory Visit | Admitting: Radiation Oncology

## 2020-11-30 ENCOUNTER — Ambulatory Visit: Payer: Medicare Other

## 2020-11-30 DIAGNOSIS — Z510 Encounter for antineoplastic radiation therapy: Secondary | ICD-10-CM | POA: Diagnosis not present

## 2020-12-01 ENCOUNTER — Other Ambulatory Visit: Payer: Self-pay

## 2020-12-01 ENCOUNTER — Ambulatory Visit: Payer: Medicare Other

## 2020-12-01 ENCOUNTER — Ambulatory Visit
Admission: RE | Admit: 2020-12-01 | Discharge: 2020-12-01 | Disposition: A | Discharge status: Home / Self Care | Payer: Medicare Other | Source: Ambulatory Visit | Attending: Radiation Oncology | Admitting: Radiation Oncology

## 2020-12-01 DIAGNOSIS — Z171 Estrogen receptor negative status [ER-]: Secondary | ICD-10-CM

## 2020-12-01 DIAGNOSIS — R293 Abnormal posture: Secondary | ICD-10-CM

## 2020-12-01 DIAGNOSIS — M4004 Postural kyphosis, thoracic region: Secondary | ICD-10-CM

## 2020-12-01 DIAGNOSIS — M25612 Stiffness of left shoulder, not elsewhere classified: Secondary | ICD-10-CM

## 2020-12-01 DIAGNOSIS — Z510 Encounter for antineoplastic radiation therapy: Secondary | ICD-10-CM | POA: Diagnosis not present

## 2020-12-01 DIAGNOSIS — C50312 Malignant neoplasm of lower-inner quadrant of left female breast: Secondary | ICD-10-CM

## 2020-12-01 DIAGNOSIS — Z483 Aftercare following surgery for neoplasm: Secondary | ICD-10-CM

## 2020-12-01 NOTE — Therapy (Signed)
[place], [place], [address] Phone: [phone]   Fax:  [phone]  Physical Therapy Treatment  Patient Details  Name: Donna [name] MRN: [id number] Date of Birth: 05-30-1946 Referring Provider (PT): Dr. Toth/Dr. Isidore Moos   Encounter Date: 12/01/2020   PT End of Session - 12/01/20 0827     Visit Number 12    Number of Visits 20    Date for PT Re-Evaluation 12/29/20    PT Start Time 0801    PT Stop Time 6599    PT Time Calculation (min) 49 min             Past Medical History:  Diagnosis Date   Arthritis    oa   History of kidney stones    Hypertension     Past Surgical History:  Procedure Laterality Date   CYSTOSCOPY  yrs ago   IR IMAGING GUIDED PORT INSERTION  02/16/2020   MASTECTOMY W/ SENTINEL NODE BIOPSY Left 08/29/2020   Procedure: LEFT MASTECTOMY WITH SENTINEL LYMPH NODE BIOPSY;  Surgeon: [name], MD;  Location: [facility];  Service: General;  Laterality: Left;  RNFA, PEC BLOCK   TOTAL KNEE ARTHROPLASTY Left 06/05/2016   Procedure: LEFT TOTAL KNEE ARTHROPLASTY;  Surgeon: [name], MD;  [facility];  Service: Orthopedics;  Laterality: Left;    There were no vitals filed for this visit.   Subjective Assessment - 12/01/20 0803     Subjective It was a little rough at the [facility] yesterday.  They put me through some of the same things as simulation. It was tough keeping my arm up but I did OK.    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  She is presently staying in town with her children but is looking forward to going home to [place].    Currently in Pain? No/denies                [facility] PT Assessment - 12/01/20 0001       Assessment   Medical Diagnosis Malignant neoplasm lower inner quadrant Left breast    Referring Provider (PT) Dr. Toth/Dr. Isidore Moos      AROM   Left  Shoulder Flexion 80 Degrees   with much compensation from prior injury   Left Shoulder ABduction 61 Degrees   with max compensation     PROM   Left Shoulder Flexion 132 Degrees    Left Shoulder ABduction 137 Degrees   in scaption   Left Shoulder External Rotation 9 Degrees   at 90 deg abd                          [facility] Adult PT Treatment/Exercise - 12/01/20 0001       Shoulder Exercises: Supine   External Rotation Strengthening;Left;15 reps   alternating isometrics IR and ER   Other Supine Exercises AA wand flex,scaption x 5, star gazer x 5 with overpressure by PT    Other Supine Exercises supine and SL left arm circles x 10 ea direction, supine ABCs A- G with assist by PT      Shoulder Exercises: Sidelying   ABduction AROM;Left;10 reps    Theraband Level (Shoulder ABduction) --   assist of PT to prevent scapular hiking     Manual Therapy   Joint Mobilization GH AP and inf glides at neutral and end  range abduction, long axis distraction    Scapular Mobilization Right S/L for Lt scap mobs    Passive ROM PROM/AA/AROM, ROM to left shoulder flex, scaption, D2 flex,ER with firm end feel but end feel improving slightly with scaption, flex                         PT Long Term Goals - 12/01/20 0903       PT LONG TERM GOAL #1   Title Pt will be independent and compliant with a HEP for left shoulder ROM/strength    Baseline compliant but requires assist in PT to do correctly    Time 4    Period Weeks    Status Partially Met    Target Date 12/29/20      PT LONG TERM GOAL #2   Title Pts PROM/AA ROM left shoulder improved to 130 deg  flexion    Baseline achieved today, but varies and only after stretching    Time 4    Period Weeks    Status Partially Met      PT LONG TERM GOAL #3   Title Pt able maintain  her arm in proper position for radiation    Time 4    Period Weeks    Status Revised   able to initiate radiation now and starts today   Target  Date 12/29/20                   Plan - 12/01/20 0858     Clinical Impression Statement Pt works very hard in therapy and at home although she requires PT guidance and assist for exs in clinic.  PROM has improved to the point where she is able to now do radiation, however, she still gets very stiff between appts and she needs to be able to maintain ROM to continue radiation daily over the next 5-6 weeks.  End feels for flexion and scaption are slightly improved.  She still demonstrates significant compensation  with attempts at AROM and has difficulty depressing her scapula.    Personal Factors and Comorbidities Age;Comorbidity 2;Comorbidity 3+    Comorbidities age, recent Cancer diagnosis Triple negative,Left shoulder limitations in AROM pre surgery    Examination-Activity Limitations Reach Overhead;Dressing    Stability/Clinical Decision Making Stable/Uncomplicated    Rehab Potential Good    PT Frequency 2x / week   will decrease to 1x per week if pt demonstrates she can maintain position for radiation   PT Duration 4 weeks    PT Treatment/Interventions Therapeutic exercise;Manual techniques;Patient/family education;Manual lymph drainage;Passive range of motion;Joint Manipulations    PT Next Visit Plan [facility] next week or so unless already [place] Joint mobs/scap. mobs left shoulder, increase ROM,counter stretch,    PT Home Exercise Plan 3 post op exs excluding wall walk, flex and ER done in supine    Consulted and Agree with Plan of Care Patient             Patient will benefit from skilled therapeutic intervention in order to improve the following deficits and impairments:  Decreased knowledge of precautions, Decreased range of motion, Decreased strength, Impaired UE functional use, Postural dysfunction  Visit Diagnosis: Aftercare following surgery for neoplasm  Stiffness of left shoulder, not elsewhere classified  Abnormal posture  Postural kyphosis of thoracic  region  Malignant neoplasm of lower-inner quadrant of left breast in female, estrogen receptor negative ([place])     Problem List Patient Active Problem  List   Diagnosis Date Noted   Cancer of left female breast ([place]) 08/29/2020   Port-A-Cath in place 03/22/2020   Malignant neoplasm of upper-inner quadrant of left breast in female, estrogen receptor negative ([place]) 02/04/2020   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

## 2020-12-02 ENCOUNTER — Ambulatory Visit: Payer: Medicare Other

## 2020-12-02 ENCOUNTER — Ambulatory Visit
Admission: RE | Admit: 2020-12-02 | Discharge: 2020-12-02 | Disposition: A | Discharge status: Home / Self Care | Payer: Medicare Other | Source: Ambulatory Visit | Attending: Radiation Oncology | Admitting: Radiation Oncology

## 2020-12-02 ENCOUNTER — Other Ambulatory Visit: Payer: Self-pay

## 2020-12-02 DIAGNOSIS — C50912 Malignant neoplasm of unspecified site of left female breast: Secondary | ICD-10-CM

## 2020-12-02 DIAGNOSIS — Z510 Encounter for antineoplastic radiation therapy: Secondary | ICD-10-CM | POA: Diagnosis not present

## 2020-12-02 MED ORDER — ALRA NON-METALLIC DEODORANT (RAD-ONC)
1.0000 "application " | Freq: Once | TOPICAL | Status: AC
  Administered 2020-12-02: 1 via TOPICAL

## 2020-12-02 MED ORDER — RADIAPLEXRX EX GEL: Freq: Once | CUTANEOUS | Status: AC

## 2020-12-02 NOTE — Progress Notes (Signed)

## 2020-12-05 ENCOUNTER — Ambulatory Visit: Payer: Medicare Other | Admitting: Radiation Oncology

## 2020-12-05 ENCOUNTER — Ambulatory Visit
Admission: RE | Admit: 2020-12-05 | Discharge: 2020-12-05 | Disposition: A | Discharge status: Home / Self Care | Payer: Medicare Other | Source: Ambulatory Visit | Attending: Radiation Oncology | Admitting: Radiation Oncology

## 2020-12-05 ENCOUNTER — Ambulatory Visit: Payer: Medicare Other

## 2020-12-05 ENCOUNTER — Other Ambulatory Visit: Payer: Self-pay

## 2020-12-05 DIAGNOSIS — Z510 Encounter for antineoplastic radiation therapy: Secondary | ICD-10-CM | POA: Diagnosis not present

## 2020-12-06 ENCOUNTER — Other Ambulatory Visit: Payer: Self-pay

## 2020-12-06 ENCOUNTER — Ambulatory Visit: Payer: Medicare Other

## 2020-12-06 ENCOUNTER — Ambulatory Visit
Admission: RE | Admit: 2020-12-06 | Discharge: 2020-12-06 | Disposition: A | Discharge status: Home / Self Care | Payer: Medicare Other | Source: Ambulatory Visit | Attending: Radiation Oncology | Admitting: Radiation Oncology

## 2020-12-06 DIAGNOSIS — Z510 Encounter for antineoplastic radiation therapy: Secondary | ICD-10-CM | POA: Diagnosis not present

## 2020-12-07 ENCOUNTER — Ambulatory Visit: Payer: Medicare Other

## 2020-12-07 ENCOUNTER — Ambulatory Visit
Admission: RE | Admit: 2020-12-07 | Discharge: 2020-12-07 | Disposition: A | Discharge status: Home / Self Care | Payer: Medicare Other | Source: Ambulatory Visit | Attending: Radiation Oncology | Admitting: Radiation Oncology

## 2020-12-07 DIAGNOSIS — R293 Abnormal posture: Secondary | ICD-10-CM

## 2020-12-07 DIAGNOSIS — M25612 Stiffness of left shoulder, not elsewhere classified: Secondary | ICD-10-CM

## 2020-12-07 DIAGNOSIS — C50312 Malignant neoplasm of lower-inner quadrant of left female breast: Secondary | ICD-10-CM

## 2020-12-07 DIAGNOSIS — Z510 Encounter for antineoplastic radiation therapy: Secondary | ICD-10-CM | POA: Diagnosis not present

## 2020-12-07 DIAGNOSIS — M4004 Postural kyphosis, thoracic region: Secondary | ICD-10-CM

## 2020-12-07 DIAGNOSIS — Z483 Aftercare following surgery for neoplasm: Secondary | ICD-10-CM

## 2020-12-07 DIAGNOSIS — Z171 Estrogen receptor negative status [ER-]: Secondary | ICD-10-CM

## 2020-12-07 NOTE — Therapy (Signed)
[place], [place], [address] Phone: [phone]   Fax:  [phone]  Physical Therapy Treatment  Patient Details  Name: Donna [name] MRN: [id number] Date of Birth: 1945-09-03 Referring Provider (PT): Dr. Toth/Dr. Isidore Moos   Encounter Date: 12/07/2020   PT End of Session - 12/07/20 0903     Visit Number 13    Number of Visits 20    Date for PT Re-Evaluation 12/29/20    PT Start Time 0803    PT Stop Time 0902    PT Time Calculation (min) 59 min    Activity Tolerance Patient tolerated treatment well    Behavior During Therapy [facility] for tasks assessed/performed             Past Medical History:  Diagnosis Date   Arthritis    oa   History of kidney stones    Hypertension     Past Surgical History:  Procedure Laterality Date   CYSTOSCOPY  yrs ago   IR IMAGING GUIDED PORT INSERTION  02/16/2020   MASTECTOMY W/ SENTINEL NODE BIOPSY Left 08/29/2020   Procedure: LEFT MASTECTOMY WITH SENTINEL LYMPH NODE BIOPSY;  Surgeon: [name], MD;  Location: [facility];  Service: General;  Laterality: Left;  RNFA, PEC BLOCK   TOTAL KNEE ARTHROPLASTY Left 06/05/2016   Procedure: LEFT TOTAL KNEE ARTHROPLASTY;  Surgeon: [name], MD;  [facility];  Service: Orthopedics;  Laterality: Left;    There were no vitals filed for this visit.   Subjective Assessment - 12/07/20 0811     Subjective My Lt shoulder is trying to get better, it's just real slow going.    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  She is presently staying in town with her children but is looking forward to going home to [place].    Patient Stated Goals Get arm up enough to have radiation                    L-DEX FLOWSHEETS - 12/07/20 0800       L-DEX LYMPHEDEMA SCREENING   Measurement Type Unilateral    L-DEX MEASUREMENT  EXTREMITY Upper Extremity    POSITION  Standing    DOMINANT SIDE Right    At Risk Side Left    BASELINE SCORE (UNILATERAL) -5.9    L-DEX SCORE (UNILATERAL) -4.1    VALUE CHANGE (UNILAT) 1.8                       [facility] Adult PT Treatment/Exercise - 12/07/20 0001       Self-Care   Self-Care Other Self-Care Comments    Other Self-Care Comments  Spent time explaining SOZO screen to pt while having her do this answering her questions regarding this and then explaining results which were WNLs      Shoulder Exercises: Pulleys   Flexion 2 minutes    Flexion Limitations Tactile cues throughout to help pt with decreasing Lt scapula compensation and cuing to relax UE    Scaption 2 minutes    Scaption Limitations Same cuing as for flex      Manual Therapy   Joint Mobilization GH AP and inf glides at neutral and end range abduction, long axis distraction    Scapular Mobilization Right S/L for Lt scap mobs    Passive ROM In Supine to left shoulder flex, scaption, D2, ER  PT Long Term Goals - 12/01/20 0903       PT LONG TERM GOAL #1   Title Pt will be independent and compliant with a HEP for left shoulder ROM/strength    Baseline compliant but requires assist in PT to do correctly    Time 4    Period Weeks    Status Partially Met    Target Date 12/29/20      PT LONG TERM GOAL #2   Title Pts PROM/AA ROM left shoulder improved to 130 deg  flexion    Baseline achieved today, but varies and only after stretching    Time 4    Period Weeks    Status Partially Met      PT LONG TERM GOAL #3   Title Pt able maintain  her arm in proper position for radiation    Time 4    Period Weeks    Status Revised   able to initiate radiation now and starts today   Target Date 12/29/20                   Plan - 12/07/20 0904     Clinical Impression Statement Did pts SOZO screen today at beginning of session spending time explaining  benefits of this to her and then her score, which was a change from baseline of 1.8 which is WNLs. Pt was able to verbalize good understanding of this. Then continued with focus on promoting increased Lt shoulder A/AA/P/ROM. PT does still struggle with protective posture due to years of shoulder dysfunction, however she is able to better relax shoulder now with cuing allowing for better stretches and improved end ROM.    Personal Factors and Comorbidities Age;Comorbidity 2;Comorbidity 3+    Comorbidities age, recent Cancer diagnosis Triple negative,Left shoulder limitations in AROM pre surgery    Examination-Activity Limitations Reach Overhead;Dressing    Stability/Clinical Decision Making Stable/Uncomplicated    Rehab Potential Good    PT Frequency 2x / week   will decr 1x/wk if pt is maintaining radiation position   PT Duration 4 weeks    PT Treatment/Interventions Therapeutic exercise;Manual techniques;Patient/family education;Manual lymph drainage;Passive range of motion;Joint Manipulations    PT Next Visit Plan [place] Joint mobs/scap. mobs left shoulder, increase ROM,counter stretch,    PT Home Exercise Plan 3 post op exs excluding wall walk, flex and ER done in supine    Consulted and Agree with Plan of Care Patient             Patient will benefit from skilled therapeutic intervention in order to improve the following deficits and impairments:  Decreased knowledge of precautions, Decreased range of motion, Decreased strength, Impaired UE functional use, Postural dysfunction  Visit Diagnosis: Aftercare following surgery for neoplasm  Stiffness of left shoulder, not elsewhere classified  Abnormal posture  Postural kyphosis of thoracic region  Malignant neoplasm of lower-inner quadrant of left breast in female, estrogen receptor negative ([place])     Problem List Patient Active Problem List   Diagnosis Date Noted   Cancer of left female breast ([place]) 08/29/2020   Port-A-Cath in  place 03/22/2020   Malignant neoplasm of upper-inner quadrant of left breast in female, estrogen receptor negative ([place]) 02/04/2020   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

## 2020-12-08 ENCOUNTER — Ambulatory Visit: Payer: Medicare Other

## 2020-12-08 ENCOUNTER — Other Ambulatory Visit: Payer: Self-pay

## 2020-12-08 ENCOUNTER — Ambulatory Visit
Admission: RE | Admit: 2020-12-08 | Discharge: 2020-12-08 | Disposition: A | Discharge status: Home / Self Care | Payer: Medicare Other | Source: Ambulatory Visit | Attending: Radiation Oncology | Admitting: Radiation Oncology

## 2020-12-08 DIAGNOSIS — Z510 Encounter for antineoplastic radiation therapy: Secondary | ICD-10-CM | POA: Diagnosis not present

## 2020-12-09 ENCOUNTER — Ambulatory Visit: Payer: Medicare Other | Attending: Radiation Oncology

## 2020-12-09 ENCOUNTER — Ambulatory Visit: Payer: Medicare Other

## 2020-12-09 ENCOUNTER — Ambulatory Visit
Admission: RE | Admit: 2020-12-09 | Discharge: 2020-12-09 | Disposition: A | Discharge status: Home / Self Care | Payer: Medicare Other | Source: Ambulatory Visit | Attending: Radiation Oncology | Admitting: Radiation Oncology

## 2020-12-09 DIAGNOSIS — M4004 Postural kyphosis, thoracic region: Secondary | ICD-10-CM | POA: Insufficient documentation

## 2020-12-09 DIAGNOSIS — R293 Abnormal posture: Secondary | ICD-10-CM | POA: Insufficient documentation

## 2020-12-09 DIAGNOSIS — Z483 Aftercare following surgery for neoplasm: Secondary | ICD-10-CM | POA: Insufficient documentation

## 2020-12-09 DIAGNOSIS — C50312 Malignant neoplasm of lower-inner quadrant of left female breast: Secondary | ICD-10-CM | POA: Insufficient documentation

## 2020-12-09 DIAGNOSIS — Z171 Estrogen receptor negative status [ER-]: Secondary | ICD-10-CM | POA: Diagnosis present

## 2020-12-09 DIAGNOSIS — M25612 Stiffness of left shoulder, not elsewhere classified: Secondary | ICD-10-CM | POA: Diagnosis present

## 2020-12-09 DIAGNOSIS — Z510 Encounter for antineoplastic radiation therapy: Secondary | ICD-10-CM | POA: Insufficient documentation

## 2020-12-09 DIAGNOSIS — C50212 Malignant neoplasm of upper-inner quadrant of left female breast: Secondary | ICD-10-CM | POA: Insufficient documentation

## 2020-12-09 NOTE — Therapy (Signed)
[place], [place], [address] Phone: [phone]   Fax:  [phone]  Physical Therapy Treatment  Patient Details  Name: Donna [name] MRN: [id number] Date of Birth: 1945/12/23 Referring Provider (PT): Dr. Toth/Dr. Isidore Moos   Encounter Date: 12/09/2020   PT End of Session - 12/09/20 0958     Visit Number 14    Number of Visits 20    Date for PT Re-Evaluation 12/29/20    PT Start Time 0905    PT Stop Time 0950    PT Time Calculation (min) 45 min    Activity Tolerance Patient tolerated treatment well    Behavior During Therapy [facility] for tasks assessed/performed             Past Medical History:  Diagnosis Date   Arthritis    oa   History of kidney stones    Hypertension     Past Surgical History:  Procedure Laterality Date   CYSTOSCOPY  yrs ago   IR IMAGING GUIDED PORT INSERTION  02/16/2020   MASTECTOMY W/ SENTINEL NODE BIOPSY Left 08/29/2020   Procedure: LEFT MASTECTOMY WITH SENTINEL LYMPH NODE BIOPSY;  Surgeon: [name], MD;  Location: [facility];  Service: General;  Laterality: Left;  RNFA, PEC BLOCK   TOTAL KNEE ARTHROPLASTY Left 06/05/2016   Procedure: LEFT TOTAL KNEE ARTHROPLASTY;  Surgeon: [name], MD;  [facility];  Service: Orthopedics;  Laterality: Left;    There were no vitals filed for this visit.   Subjective Assessment - 12/09/20 0907     Subjective Radiation is going well but I have some trouble holding my breath for 20 seconds.  Think my shoulder is doing a little better.    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  She is presently staying in town with her children but is looking forward to going home to [place].    Patient Stated Goals Get arm up enough to have radiation    Currently in Pain? No/denies    Multiple Pain Sites No                                [facility] Adult PT Treatment/Exercise - 12/09/20 0001       Shoulder Exercises: Supine   Diagonals Limitations Right SL flex and abd raises x 10    Other Supine Exercises AA flex with hands, stargazer stretch x5    Other Supine Exercises supine and SL left arm circles x 10 ea direction,     Manual Therapy   Joint Mobilization GH AP and inf glides at neutral and end range abduction, long axis distraction    Passive ROM In Supine to left shoulder flex, scaption, D2, ER                         PT Long Term Goals - 12/01/20 7858       PT LONG TERM GOAL #1   Title Pt will be independent and compliant with a HEP for left shoulder ROM/strength    Baseline compliant but requires assist in PT to do correctly    Time 4    Period Weeks    Status Partially Met    Target Date 12/29/20      PT LONG TERM GOAL #2   Title Pts PROM/AA ROM  left shoulder improved to 130 deg  flexion    Baseline achieved today, but varies and only after stretching    Time 4    Period Weeks    Status Partially Met      PT LONG TERM GOAL #3   Title Pt able maintain  her arm in proper position for radiation    Time 4    Period Weeks    Status Revised   able to initiate radiation now and starts today   Target Date 12/29/20                   Plan - 12/09/20 0959     Clinical Impression Statement Pt continues to have improved end feel with shoulder flexion and scaption, but noticeable grating crepitus with ER when tried at varies angles of Abd.  Less crepitus when done in star gazer position.  She fatigues quickly with AROM exercises    Personal Factors and Comorbidities Age;Comorbidity 2;Comorbidity 3+    Comorbidities age, recent Cancer diagnosis Triple negative,Left shoulder limitations in AROM pre surgery    Examination-Activity Limitations Reach Overhead;Dressing    Stability/Clinical Decision Making Stable/Uncomplicated    Rehab Potential Good     PT Frequency 2x / week    PT Duration 4 weeks    PT Treatment/Interventions Therapeutic exercise;Manual techniques;Patient/family education;Manual lymph drainage;Passive range of motion;Joint Manipulations    PT Next Visit Plan [facility] Joint mobs/scap. mobs left shoulder, increase ROM,counter stretch,    PT Home Exercise Plan 3 post op exs excluding wall walk, flex and ER done in supine    Recommended Other Services Next [facility] around Sept 29    Consulted and Agree with Plan of Care Patient             Patient will benefit from skilled therapeutic intervention in order to improve the following deficits and impairments:  Decreased knowledge of precautions, Decreased range of motion, Decreased strength, Impaired UE functional use, Postural dysfunction  Visit Diagnosis: Aftercare following surgery for neoplasm  Stiffness of left shoulder, not elsewhere classified  Abnormal posture  Postural kyphosis of thoracic region  Malignant neoplasm of lower-inner quadrant of left breast in female, estrogen receptor negative ([place])     Problem List Patient Active Problem List   Diagnosis Date Noted   Cancer of left female breast ([place]) 08/29/2020   Port-A-Cath in place 03/22/2020   Malignant neoplasm of upper-inner quadrant of left breast in female, estrogen receptor negative ([place]) 02/04/2020   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

## 2020-12-13 ENCOUNTER — Ambulatory Visit: Payer: Medicare Other

## 2020-12-13 ENCOUNTER — Other Ambulatory Visit: Payer: Self-pay

## 2020-12-13 ENCOUNTER — Ambulatory Visit
Admission: RE | Admit: 2020-12-13 | Discharge: 2020-12-13 | Disposition: A | Discharge status: Home / Self Care | Payer: Medicare Other | Source: Ambulatory Visit | Attending: Radiation Oncology | Admitting: Radiation Oncology

## 2020-12-13 DIAGNOSIS — C50312 Malignant neoplasm of lower-inner quadrant of left female breast: Secondary | ICD-10-CM

## 2020-12-13 DIAGNOSIS — Z510 Encounter for antineoplastic radiation therapy: Secondary | ICD-10-CM | POA: Diagnosis not present

## 2020-12-13 DIAGNOSIS — Z483 Aftercare following surgery for neoplasm: Secondary | ICD-10-CM | POA: Diagnosis not present

## 2020-12-13 DIAGNOSIS — R293 Abnormal posture: Secondary | ICD-10-CM

## 2020-12-13 DIAGNOSIS — M25612 Stiffness of left shoulder, not elsewhere classified: Secondary | ICD-10-CM

## 2020-12-13 DIAGNOSIS — M4004 Postural kyphosis, thoracic region: Secondary | ICD-10-CM

## 2020-12-13 DIAGNOSIS — Z171 Estrogen receptor negative status [ER-]: Secondary | ICD-10-CM

## 2020-12-13 NOTE — Therapy (Signed)
[place], [place], [address] Phone: [phone]   Fax:  [phone]  Physical Therapy Treatment  Patient Details  Name: Donna [name] MRN: [id number] Date of Birth: 1945/09/26 Referring Provider (PT): Dr. Toth/Dr. Isidore Moos   Encounter Date: 12/13/2020   PT End of Session - 12/13/20 0911     Visit Number 15    Number of Visits 20    Date for PT Re-Evaluation 12/29/20    PT Start Time 0905    PT Stop Time 0947    PT Time Calculation (min) 42 min    Activity Tolerance Patient tolerated treatment well    Behavior During Therapy [facility] for tasks assessed/performed             Past Medical History:  Diagnosis Date   Arthritis    oa   History of kidney stones    Hypertension     Past Surgical History:  Procedure Laterality Date   CYSTOSCOPY  yrs ago   IR IMAGING GUIDED PORT INSERTION  02/16/2020   MASTECTOMY W/ SENTINEL NODE BIOPSY Left 08/29/2020   Procedure: LEFT MASTECTOMY WITH SENTINEL LYMPH NODE BIOPSY;  Surgeon: [name], MD;  Location: [facility];  Service: General;  Laterality: Left;  RNFA, PEC BLOCK   TOTAL KNEE ARTHROPLASTY Left 06/05/2016   Procedure: LEFT TOTAL KNEE ARTHROPLASTY;  Surgeon: [name], MD;  [facility];  Service: Orthopedics;  Laterality: Left;    There were no vitals filed for this visit.   Subjective Assessment - 12/13/20 0906     Subjective Radiation went pretty well on Friday. They helped me get my arm in position but I can almost do it myself.    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  She is presently staying in town with her children but is looking forward to going home to [place].    Patient Stated Goals Get arm up enough to have radiation    Currently in Pain? No/denies    Multiple Pain Sites No                               [facility]  Adult PT Treatment/Exercise - 12/13/20 0001       Shoulder Exercises: Supine   Diagonals Limitations Right SL flex and abd raises x 10, and ER x 10    Other Supine Exercises AA flex with hands, stargazer stretch x5    Other Supine Exercises supine and SL left arm circles x 10 ea direction, supine ABCs A- G with assist by PT      Manual Therapy   Joint Mobilization GH AP and inf glides at neutral and end range abduction, long axis distraction    Scapular Mobilization Right S/L for Lt scap mobs    Passive ROM In Supine to left shoulder flex, scaption, D2, ER                         PT Long Term Goals - 12/01/20 4383       PT LONG TERM GOAL #1   Title Pt will be independent and compliant with a HEP for left shoulder ROM/strength    Baseline compliant but requires assist in PT to do correctly    Time 4    Period Weeks    Status Partially Met  Target Date 12/29/20      PT LONG TERM GOAL #2   Title Pts PROM/AA ROM left shoulder improved to 130 deg  flexion    Baseline achieved today, but varies and only after stretching    Time 4    Period Weeks    Status Partially Met      PT LONG TERM GOAL #3   Title Pt able maintain  her arm in proper position for radiation    Time 4    Period Weeks    Status Revised   able to initiate radiation now and starts today   Target Date 12/29/20                   Plan - 12/13/20 0912     Clinical Impression Statement pt continues with a very stiff shoulder, however she has been tolerating radiation despite this.  End feels continue to be improved for flexion and scaption, but is very firm with ER.  Pt is improving with supine strength and requires less assist with AAROM exs.  We are going to try decreasing to 1x per week as long as she maintains her ROM for radiation.    Personal Factors and Comorbidities Age;Comorbidity 2;Comorbidity 3+    Comorbidities age, recent Cancer diagnosis Triple negative,Left shoulder  limitations in AROM pre surgery    Examination-Activity Limitations Reach Overhead;Dressing    Stability/Clinical Decision Making Stable/Uncomplicated    Rehab Potential Good    PT Frequency 2x / week    PT Duration 4 weeks    PT Treatment/Interventions Therapeutic exercise;Manual techniques;Patient/family education;Manual lymph drainage;Passive range of motion;Joint Manipulations    PT Next Visit Plan [place] Joint mobs/scap. mobs left shoulder, increase ROM,counter stretch,    PT Home Exercise Plan 3 post op exs excluding wall walk, flex and ER done in supine    Consulted and Agree with Plan of Care Patient             Patient will benefit from skilled therapeutic intervention in order to improve the following deficits and impairments:  Decreased knowledge of precautions, Decreased range of motion, Decreased strength, Impaired UE functional use, Postural dysfunction  Visit Diagnosis: Aftercare following surgery for neoplasm  Stiffness of left shoulder, not elsewhere classified  Abnormal posture  Postural kyphosis of thoracic region  Malignant neoplasm of lower-inner quadrant of left breast in female, estrogen receptor negative ([place])     Problem List Patient Active Problem List   Diagnosis Date Noted   Cancer of left female breast ([place]) 08/29/2020   Port-A-Cath in place 03/22/2020   Malignant neoplasm of upper-inner quadrant of left breast in female, estrogen receptor negative ([place]) 02/04/2020   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

## 2020-12-14 ENCOUNTER — Ambulatory Visit: Payer: Medicare Other

## 2020-12-14 ENCOUNTER — Ambulatory Visit
Admission: RE | Admit: 2020-12-14 | Discharge: 2020-12-14 | Disposition: A | Discharge status: Home / Self Care | Payer: Medicare Other | Source: Ambulatory Visit | Attending: Radiation Oncology | Admitting: Radiation Oncology

## 2020-12-14 DIAGNOSIS — Z510 Encounter for antineoplastic radiation therapy: Secondary | ICD-10-CM | POA: Diagnosis not present

## 2020-12-15 ENCOUNTER — Ambulatory Visit
Admission: RE | Admit: 2020-12-15 | Discharge: 2020-12-15 | Disposition: A | Discharge status: Home / Self Care | Payer: Medicare Other | Source: Ambulatory Visit | Attending: Radiation Oncology | Admitting: Radiation Oncology

## 2020-12-15 ENCOUNTER — Ambulatory Visit: Payer: Medicare Other

## 2020-12-15 ENCOUNTER — Other Ambulatory Visit: Payer: Self-pay

## 2020-12-15 DIAGNOSIS — Z510 Encounter for antineoplastic radiation therapy: Secondary | ICD-10-CM | POA: Diagnosis not present

## 2020-12-16 ENCOUNTER — Other Ambulatory Visit: Payer: Self-pay

## 2020-12-16 ENCOUNTER — Ambulatory Visit: Payer: Medicare Other

## 2020-12-16 ENCOUNTER — Other Ambulatory Visit: Payer: Self-pay | Admitting: Radiation Oncology

## 2020-12-16 ENCOUNTER — Ambulatory Visit
Admission: RE | Admit: 2020-12-16 | Discharge: 2020-12-16 | Disposition: A | Discharge status: Home / Self Care | Payer: Medicare Other | Source: Ambulatory Visit | Attending: Radiation Oncology | Admitting: Radiation Oncology

## 2020-12-16 DIAGNOSIS — C50912 Malignant neoplasm of unspecified site of left female breast: Secondary | ICD-10-CM

## 2020-12-16 DIAGNOSIS — Z510 Encounter for antineoplastic radiation therapy: Secondary | ICD-10-CM | POA: Diagnosis not present

## 2020-12-16 DIAGNOSIS — I10 Essential (primary) hypertension: Secondary | ICD-10-CM

## 2020-12-16 MED ORDER — LOSARTAN POTASSIUM 50 MG PO TABS: 50.0000 mg | ORAL_TABLET | Freq: Every day | ORAL | 0 refills | Status: DC

## 2020-12-16 MED ORDER — AMLODIPINE BESYLATE 10 MG PO TABS: 10.0000 mg | ORAL_TABLET | Freq: Every day | ORAL | 0 refills | Status: DC

## 2020-12-16 MED ORDER — CHLORTHALIDONE 25 MG PO TABS: 25.0000 mg | ORAL_TABLET | Freq: Every day | ORAL | 0 refills | Status: DC

## 2020-12-19 ENCOUNTER — Ambulatory Visit: Payer: Medicare Other

## 2020-12-19 ENCOUNTER — Ambulatory Visit: Payer: Medicare Other | Admitting: Radiation Oncology

## 2020-12-19 ENCOUNTER — Ambulatory Visit
Admission: RE | Admit: 2020-12-19 | Discharge: 2020-12-19 | Disposition: A | Discharge status: Home / Self Care | Payer: Medicare Other | Source: Ambulatory Visit | Attending: Radiation Oncology | Admitting: Radiation Oncology

## 2020-12-19 ENCOUNTER — Other Ambulatory Visit: Payer: Self-pay

## 2020-12-19 DIAGNOSIS — Z510 Encounter for antineoplastic radiation therapy: Secondary | ICD-10-CM | POA: Diagnosis not present

## 2020-12-20 ENCOUNTER — Ambulatory Visit: Payer: Medicare Other

## 2020-12-20 ENCOUNTER — Ambulatory Visit
Admission: RE | Admit: 2020-12-20 | Discharge: 2020-12-20 | Disposition: A | Discharge status: Home / Self Care | Payer: Medicare Other | Source: Ambulatory Visit | Attending: Radiation Oncology | Admitting: Radiation Oncology

## 2020-12-20 DIAGNOSIS — Z510 Encounter for antineoplastic radiation therapy: Secondary | ICD-10-CM | POA: Diagnosis not present

## 2020-12-21 ENCOUNTER — Ambulatory Visit: Payer: Medicare Other

## 2020-12-21 ENCOUNTER — Other Ambulatory Visit: Payer: Self-pay

## 2020-12-21 ENCOUNTER — Ambulatory Visit
Admission: RE | Admit: 2020-12-21 | Discharge: 2020-12-21 | Disposition: A | Discharge status: Home / Self Care | Payer: Medicare Other | Source: Ambulatory Visit | Attending: Radiation Oncology | Admitting: Radiation Oncology

## 2020-12-21 DIAGNOSIS — M4004 Postural kyphosis, thoracic region: Secondary | ICD-10-CM

## 2020-12-21 DIAGNOSIS — R293 Abnormal posture: Secondary | ICD-10-CM

## 2020-12-21 DIAGNOSIS — C50312 Malignant neoplasm of lower-inner quadrant of left female breast: Secondary | ICD-10-CM

## 2020-12-21 DIAGNOSIS — Z483 Aftercare following surgery for neoplasm: Secondary | ICD-10-CM | POA: Diagnosis not present

## 2020-12-21 DIAGNOSIS — M25612 Stiffness of left shoulder, not elsewhere classified: Secondary | ICD-10-CM

## 2020-12-21 DIAGNOSIS — Z171 Estrogen receptor negative status [ER-]: Secondary | ICD-10-CM

## 2020-12-21 DIAGNOSIS — Z510 Encounter for antineoplastic radiation therapy: Secondary | ICD-10-CM | POA: Diagnosis not present

## 2020-12-21 NOTE — Therapy (Signed)
[place], [place], [address] Phone: [phone]   Fax:  [phone]  Physical Therapy Treatment  Patient Details  Name: Donna [name] MRN: [id number] Date of Birth: 1945/09/29 Referring Provider (PT): Dr. Toth/Dr. Isidore Moos   Encounter Date: 12/21/2020   PT End of Session - 12/21/20 0855     Visit Number 16    Number of Visits 20    Date for PT Re-Evaluation 12/29/20    PT Start Time 0806    PT Stop Time 0851    PT Time Calculation (min) 45 min    Activity Tolerance Patient tolerated treatment well    Behavior During Therapy [facility] for tasks assessed/performed             Past Medical History:  Diagnosis Date   Arthritis    oa   History of kidney stones    Hypertension     Past Surgical History:  Procedure Laterality Date   CYSTOSCOPY  yrs ago   IR IMAGING GUIDED PORT INSERTION  02/16/2020   MASTECTOMY W/ SENTINEL NODE BIOPSY Left 08/29/2020   Procedure: LEFT MASTECTOMY WITH SENTINEL LYMPH NODE BIOPSY;  Surgeon: [name], MD;  Location: [facility];  Service: General;  Laterality: Left;  RNFA, PEC BLOCK   TOTAL KNEE ARTHROPLASTY Left 06/05/2016   Procedure: LEFT TOTAL KNEE ARTHROPLASTY;  Surgeon: [name], MD;  Location[facility];  Service: Orthopedics;  Laterality: Left;    There were no vitals filed for this visit.   Subjective Assessment - 12/21/20 0808     Subjective Radiation is going pretty well.  My shoulder gets sore, but yesterday I almost got my arm up by myself. I have radiation right after this today.    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  She is presently staying in town with her children but is looking forward to going home to [place].    Patient Stated Goals Get arm up enough to have radiation    Currently in Pain? No/denies    Multiple Pain Sites No                                [facility] Adult PT Treatment/Exercise - 12/21/20 0001       Shoulder Exercises: Supine   Diagonals Limitations Right SL flex and abd raises x 10, and ER x 10    Other Supine Exercises AA flex with hands, stargazer stretch x5    Other Supine Exercises supine and SL left arm circles x 10 ea direction, supine ABCs A- G with assist by PT      Manual Therapy   Joint Mobilization GH AP and inf glides at neutral and end range abduction, long axis distraction    Scapular Mobilization Right S/L for Lt scap mobs    Passive ROM In Supine to left shoulder flex, scaption, D2, ER                         PT Long Term Goals - 12/01/20 7680       PT LONG TERM GOAL #1   Title Pt will be independent and compliant with a HEP for left shoulder ROM/strength    Baseline compliant but requires assist in PT to do correctly    Time 4    Period Weeks  Status Partially Met    Target Date 12/29/20      PT LONG TERM GOAL #2   Title Pts PROM/AA ROM left shoulder improved to 130 deg  flexion    Baseline achieved today, but varies and only after stretching    Time 4    Period Weeks    Status Partially Met      PT LONG TERM GOAL #3   Title Pt able maintain  her arm in proper position for radiation    Time 4    Period Weeks    Status Revised   able to initiate radiation now and starts today   Target Date 12/29/20                   Plan - 12/21/20 0856     Clinical Impression Statement Pt has had a week between visits and shoulder feels stiffer today, however, she has still been able to continue her radiation.  We will continue 1x per week unless stiffness appears it would limit radiation.    Personal Factors and Comorbidities Age;Comorbidity 2;Comorbidity 3+    Comorbidities age, recent Cancer diagnosis Triple negative,Left shoulder limitations in AROM pre surgery    Examination-Activity Limitations Reach Overhead;Dressing     Stability/Clinical Decision Making Stable/Uncomplicated    Rehab Potential Good    PT Frequency 2x / week    PT Duration 4 weeks    PT Treatment/Interventions Therapeutic exercise;Manual techniques;Patient/family education;Manual lymph drainage;Passive range of motion;Joint Manipulations    PT Next Visit Plan [place] Joint mobs/scap. mobs left shoulder, increase ROM,counter stretch,    PT Home Exercise Plan 3 post op exs excluding wall walk, flex and ER done in supine    Consulted and Agree with Plan of Care Patient    Family Member Consulted son [name]             Patient will benefit from skilled therapeutic intervention in order to improve the following deficits and impairments:  Decreased knowledge of precautions, Decreased range of motion, Decreased strength, Impaired UE functional use, Postural dysfunction  Visit Diagnosis: Aftercare following surgery for neoplasm  Stiffness of left shoulder, not elsewhere classified  Abnormal posture  Postural kyphosis of thoracic region  Malignant neoplasm of lower-inner quadrant of left breast in female, estrogen receptor negative ([place])     Problem List Patient Active Problem List   Diagnosis Date Noted   Cancer of left female breast ([place]) 08/29/2020   Port-A-Cath in place 03/22/2020   Malignant neoplasm of upper-inner quadrant of left breast in female, estrogen receptor negative ([place]) 02/04/2020   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

## 2020-12-22 ENCOUNTER — Ambulatory Visit
Admission: RE | Admit: 2020-12-22 | Discharge: 2020-12-22 | Disposition: A | Discharge status: Home / Self Care | Payer: Medicare Other | Source: Ambulatory Visit | Attending: Radiation Oncology | Admitting: Radiation Oncology

## 2020-12-22 ENCOUNTER — Ambulatory Visit: Payer: Medicare Other

## 2020-12-22 DIAGNOSIS — Z510 Encounter for antineoplastic radiation therapy: Secondary | ICD-10-CM | POA: Diagnosis not present

## 2020-12-23 ENCOUNTER — Ambulatory Visit
Admission: RE | Admit: 2020-12-23 | Discharge: 2020-12-23 | Disposition: A | Discharge status: Home / Self Care | Payer: Medicare Other | Source: Ambulatory Visit | Attending: Radiation Oncology | Admitting: Radiation Oncology

## 2020-12-23 ENCOUNTER — Ambulatory Visit: Payer: Medicare Other

## 2020-12-23 ENCOUNTER — Other Ambulatory Visit: Payer: Self-pay

## 2020-12-23 DIAGNOSIS — Z510 Encounter for antineoplastic radiation therapy: Secondary | ICD-10-CM | POA: Diagnosis not present

## 2020-12-26 ENCOUNTER — Ambulatory Visit: Payer: Medicare Other

## 2020-12-26 ENCOUNTER — Other Ambulatory Visit: Payer: Self-pay

## 2020-12-26 ENCOUNTER — Ambulatory Visit
Admission: RE | Admit: 2020-12-26 | Discharge: 2020-12-26 | Disposition: A | Discharge status: Home / Self Care | Payer: Medicare Other | Source: Ambulatory Visit | Attending: Radiation Oncology | Admitting: Radiation Oncology

## 2020-12-26 ENCOUNTER — Ambulatory Visit: Payer: Medicare Other | Admitting: Radiation Oncology

## 2020-12-26 DIAGNOSIS — Z510 Encounter for antineoplastic radiation therapy: Secondary | ICD-10-CM | POA: Diagnosis not present

## 2020-12-27 ENCOUNTER — Ambulatory Visit: Payer: Medicare Other

## 2020-12-27 ENCOUNTER — Ambulatory Visit
Admission: RE | Admit: 2020-12-27 | Discharge: 2020-12-27 | Disposition: A | Discharge status: Home / Self Care | Payer: Medicare Other | Source: Ambulatory Visit | Attending: Radiation Oncology | Admitting: Radiation Oncology

## 2020-12-27 ENCOUNTER — Other Ambulatory Visit: Payer: Self-pay

## 2020-12-27 DIAGNOSIS — Z171 Estrogen receptor negative status [ER-]: Secondary | ICD-10-CM

## 2020-12-27 DIAGNOSIS — M25612 Stiffness of left shoulder, not elsewhere classified: Secondary | ICD-10-CM

## 2020-12-27 DIAGNOSIS — Z510 Encounter for antineoplastic radiation therapy: Secondary | ICD-10-CM | POA: Diagnosis not present

## 2020-12-27 DIAGNOSIS — M4004 Postural kyphosis, thoracic region: Secondary | ICD-10-CM

## 2020-12-27 DIAGNOSIS — Z483 Aftercare following surgery for neoplasm: Secondary | ICD-10-CM | POA: Diagnosis not present

## 2020-12-27 DIAGNOSIS — C50312 Malignant neoplasm of lower-inner quadrant of left female breast: Secondary | ICD-10-CM

## 2020-12-27 DIAGNOSIS — R293 Abnormal posture: Secondary | ICD-10-CM

## 2020-12-27 NOTE — Therapy (Signed)
[place], [place], [address] Phone: [phone]   Fax:  [phone]  Physical Therapy Treatment  Patient Details  Name: Donna [name] MRN: [id number] Date of Birth: 11-07-1945 Referring Provider (PT): Dr. Toth/Dr. Isidore Moos   Encounter Date: 12/27/2020   PT End of Session - 12/27/20 0907     Visit Number 17    Number of Visits 20    Date for PT Re-Evaluation 12/29/20    PT Start Time 0807    PT Stop Time 0904    PT Time Calculation (min) 57 min    Activity Tolerance Patient tolerated treatment well    Behavior During Therapy [facility] for tasks assessed/performed             Past Medical History:  Diagnosis Date   Arthritis    oa   History of kidney stones    Hypertension     Past Surgical History:  Procedure Laterality Date   CYSTOSCOPY  yrs ago   IR IMAGING GUIDED PORT INSERTION  02/16/2020   MASTECTOMY W/ SENTINEL NODE BIOPSY Left 08/29/2020   Procedure: LEFT MASTECTOMY WITH SENTINEL LYMPH NODE BIOPSY;  Surgeon: [name], MD;  Location: [facility];  Service: General;  Laterality: Left;  RNFA, PEC BLOCK   TOTAL KNEE ARTHROPLASTY Left 06/05/2016   Procedure: LEFT TOTAL KNEE ARTHROPLASTY;  Surgeon: [name], MD;  [facility];  Service: Orthopedics;  Laterality: Left;    There were no vitals filed for this visit.   Subjective Assessment - 12/27/20 0809     Subjective My LT shoulder is doing okay, just a little sore in the back ofmy shoulder today.    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  She is presently staying in town with her children but is looking forward to going home to [place].    Patient Stated Goals Get arm up enough to have radiation    Currently in Pain? No/denies                               [facility] Adult PT Treatment/Exercise - 12/27/20 0001        Manual Therapy   Joint Mobilization GH AP and inf glides at neutral and end range abduction, long axis distraction    Scapular Mobilization Right S/L for Lt scap mobs    Passive ROM In Supine to left shoulder flex, scaption, D2, ER                         PT Long Term Goals - 12/01/20 2094       PT LONG TERM GOAL #1   Title Pt will be independent and compliant with a HEP for left shoulder ROM/strength    Baseline compliant but requires assist in PT to do correctly    Time 4    Period Weeks    Status Partially Met    Target Date 12/29/20      PT LONG TERM GOAL #2   Title Pts PROM/AA ROM left shoulder improved to 130 deg  flexion    Baseline achieved today, but varies and only after stretching    Time 4    Period Weeks    Status Partially Met      PT LONG TERM GOAL #3   Title Pt able maintain  her arm in proper position for radiation    Time 4    Period Weeks    Status Revised   able to initiate radiation now and starts today   Target Date 12/29/20                   Plan - 12/27/20 0907     Clinical Impression Statement Pt reports she is tolerating radiation fairly well with being able to get her arm into position. She also reports physical therapy is helping with helping to keep her shoulder loose enough to tolerate this.    Personal Factors and Comorbidities Age;Comorbidity 2;Comorbidity 3+    Comorbidities age, recent Cancer diagnosis Triple negative,Left shoulder limitations in AROM pre surgery    Examination-Activity Limitations Reach Overhead;Dressing    Stability/Clinical Decision Making Stable/Uncomplicated    Rehab Potential Good    PT Frequency 2x / week    PT Duration 4 weeks    PT Treatment/Interventions Therapeutic exercise;Manual techniques;Patient/family education;Manual lymph drainage;Passive range of motion;Joint Manipulations    PT Next Visit Plan [place] Joint mobs/scap. mobs left shoulder, increase ROM,counter stretch,    Consulted  and Agree with Plan of Care Patient             Patient will benefit from skilled therapeutic intervention in order to improve the following deficits and impairments:  Decreased knowledge of precautions, Decreased range of motion, Decreased strength, Impaired UE functional use, Postural dysfunction  Visit Diagnosis: Aftercare following surgery for neoplasm  Stiffness of left shoulder, not elsewhere classified  Abnormal posture  Postural kyphosis of thoracic region  Malignant neoplasm of lower-inner quadrant of left breast in female, estrogen receptor negative ([place])     Problem List Patient Active Problem List   Diagnosis Date Noted   Cancer of left female breast ([place]) 08/29/2020   Port-A-Cath in place 03/22/2020   Malignant neoplasm of upper-inner quadrant of left breast in female, estrogen receptor negative ([place]) 02/04/2020   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

## 2020-12-28 ENCOUNTER — Other Ambulatory Visit: Payer: Self-pay

## 2020-12-28 ENCOUNTER — Ambulatory Visit: Payer: Medicare Other

## 2020-12-28 ENCOUNTER — Ambulatory Visit
Admission: RE | Admit: 2020-12-28 | Discharge: 2020-12-28 | Disposition: A | Discharge status: Home / Self Care | Payer: Medicare Other | Source: Ambulatory Visit | Attending: Radiation Oncology | Admitting: Radiation Oncology

## 2020-12-28 DIAGNOSIS — Z510 Encounter for antineoplastic radiation therapy: Secondary | ICD-10-CM | POA: Diagnosis not present

## 2020-12-29 ENCOUNTER — Ambulatory Visit: Payer: Medicare Other

## 2020-12-29 ENCOUNTER — Other Ambulatory Visit: Payer: Self-pay

## 2020-12-29 ENCOUNTER — Ambulatory Visit
Admission: RE | Admit: 2020-12-29 | Discharge: 2020-12-29 | Disposition: A | Discharge status: Home / Self Care | Payer: Medicare Other | Source: Ambulatory Visit | Attending: Radiation Oncology | Admitting: Radiation Oncology

## 2020-12-29 DIAGNOSIS — Z510 Encounter for antineoplastic radiation therapy: Secondary | ICD-10-CM | POA: Diagnosis not present

## 2020-12-30 ENCOUNTER — Ambulatory Visit
Admission: RE | Admit: 2020-12-30 | Discharge: 2020-12-30 | Disposition: A | Discharge status: Home / Self Care | Payer: Medicare Other | Source: Ambulatory Visit | Attending: Radiation Oncology | Admitting: Radiation Oncology

## 2020-12-30 DIAGNOSIS — Z510 Encounter for antineoplastic radiation therapy: Secondary | ICD-10-CM | POA: Diagnosis not present

## 2021-01-02 ENCOUNTER — Other Ambulatory Visit: Payer: Self-pay

## 2021-01-02 ENCOUNTER — Ambulatory Visit
Admission: RE | Admit: 2021-01-02 | Discharge: 2021-01-02 | Disposition: A | Discharge status: Home / Self Care | Payer: Medicare Other | Source: Ambulatory Visit | Attending: Radiation Oncology | Admitting: Radiation Oncology

## 2021-01-02 ENCOUNTER — Ambulatory Visit: Payer: Medicare Other | Admitting: Radiation Oncology

## 2021-01-02 DIAGNOSIS — Z510 Encounter for antineoplastic radiation therapy: Secondary | ICD-10-CM | POA: Diagnosis not present

## 2021-01-03 ENCOUNTER — Ambulatory Visit: Payer: Medicare Other

## 2021-01-03 ENCOUNTER — Ambulatory Visit
Admission: RE | Admit: 2021-01-03 | Discharge: 2021-01-03 | Disposition: A | Discharge status: Home / Self Care | Payer: Medicare Other | Source: Ambulatory Visit | Attending: Radiation Oncology | Admitting: Radiation Oncology

## 2021-01-03 ENCOUNTER — Other Ambulatory Visit: Payer: Self-pay

## 2021-01-03 DIAGNOSIS — C50312 Malignant neoplasm of lower-inner quadrant of left female breast: Secondary | ICD-10-CM

## 2021-01-03 DIAGNOSIS — C50212 Malignant neoplasm of upper-inner quadrant of left female breast: Secondary | ICD-10-CM

## 2021-01-03 DIAGNOSIS — M25612 Stiffness of left shoulder, not elsewhere classified: Secondary | ICD-10-CM

## 2021-01-03 DIAGNOSIS — Z483 Aftercare following surgery for neoplasm: Secondary | ICD-10-CM | POA: Diagnosis not present

## 2021-01-03 DIAGNOSIS — M4004 Postural kyphosis, thoracic region: Secondary | ICD-10-CM

## 2021-01-03 DIAGNOSIS — Z510 Encounter for antineoplastic radiation therapy: Secondary | ICD-10-CM | POA: Diagnosis not present

## 2021-01-03 DIAGNOSIS — R293 Abnormal posture: Secondary | ICD-10-CM

## 2021-01-03 DIAGNOSIS — Z171 Estrogen receptor negative status [ER-]: Secondary | ICD-10-CM

## 2021-01-03 MED ORDER — RADIAPLEXRX EX GEL: Freq: Once | CUTANEOUS | Status: AC

## 2021-01-03 MED ORDER — ALRA NON-METALLIC DEODORANT (RAD-ONC)
1.0000 "application " | Freq: Once | TOPICAL | Status: AC
  Administered 2021-01-03: 1 via TOPICAL

## 2021-01-03 NOTE — Therapy (Addendum)
[place], [place], [address] Phone: [phone]   Fax:  [phone]  Physical Therapy Treatment  Patient Details  Name: Donna [name] MRN: [id number] Date of Birth: [birth year] Referring Provider (PT): Dr. Toth/Dr. Isidore [name]   Encounter Date: 01/03/2021   PT End of Session - 01/03/21 0903     Visit Number 18    Number of Visits 26    Date for PT Re-Evaluation 02/14/21    PT Start Time 0805    PT Stop Time 0902    PT Time Calculation (min) 57 min    Activity Tolerance Patient tolerated treatment well    Behavior During Therapy [name] for tasks assessed/performed             Past Medical History:  Diagnosis Date   Arthritis    oa   History of kidney stones    Hypertension     Past Surgical History:  Procedure Laterality Date   CYSTOSCOPY  yrs ago   IR IMAGING GUIDED PORT INSERTION  02/16/2020   MASTECTOMY W/ SENTINEL NODE BIOPSY Left 08/29/2020   Procedure: LEFT MASTECTOMY WITH SENTINEL LYMPH NODE BIOPSY;  Surgeon: Donna Kussmaul, MD;  Location: [facility];  Service: General;  Laterality: Left;  RNFA, PEC BLOCK   TOTAL KNEE ARTHROPLASTY Left 06/05/2016   Procedure: LEFT TOTAL KNEE ARTHROPLASTY;  Surgeon: Donna Cancel, MD;  [facility];  Service: Orthopedics;  Laterality: Left;    There were no vitals filed for this visit.   Subjective Assessment - 01/03/21 0809     Subjective I'm in my last 2 weeks of radiation! My Lt shoulder just feels really stiff today.    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  She is presently staying in town with her children but is looking forward to going home to [place].    Patient Stated Goals Get arm up enough to have radiation    Currently in Pain? No/denies                [facility] PT Assessment - 01/03/21 0001       AROM   Left Shoulder Flexion 95 Degrees    much compensation from old injury   Left Shoulder ABduction 64 Degrees   max compensation                          [facility] Adult PT Treatment/Exercise - 01/03/21 0001       Shoulder Exercises: Pulleys   Scaption 3 minutes    Scaption Limitations Pt returned therapist demo but required constant cuing to decrease Lt scapular compensation; pt struggled with this due to muscle guarding and fascial restrictions      Shoulder Exercises: ROM/Strengthening   [place] Seated in chair and with Lt UE worked in "D" pattern with arm at side and cuing throughout to decrease LT scapular compensation which she was able to do when reminded.      Manual Therapy   Joint Mobilization GH AP and inf glides at neutral and end range abduction, long axis distraction    Soft tissue mobilization With cocoa butter (radiation isn't until later this afternoon and pt will wash off before then) to Lt shoulder capsule    Scapular Mobilization Right S/L for Lt scap mobs    Passive ROM In Supine to left shoulder flex, scaption, D2, ER  PT Long Term Goals - 01/03/21 1306       PT LONG TERM GOAL #1   Title Pt will be independent and compliant with a HEP for left shoulder ROM/strength    Baseline compliant but requires assist in PT to do correctly; pt doing better with this but does still require min VCs to decresae scapular compensations-01/03/21    Status Partially Met      PT LONG TERM GOAL #2   Title Pts PROM/AA ROM left shoulder improved to 130 deg  flexion    Baseline achieved today, but varies and only after stretching; AA/ROM flex 120 degrees - 01/03/21    Status Partially Met      PT LONG TERM GOAL #3   Title Pt able maintain  her arm in proper position for radiation    Baseline pt is able to maintain radiation positioning in a modified position by her putting her hand on her head-01/03/21    Status Achieved                   Plan - 01/03/21 1304      Clinical Impression Statement Renewal done this visit. Pt is undergoing radiation and has about 2 more weeks left. She would like to be on hold after todays visit but have possiblity of resuming after she recovers from radiation so pushed renewal out for 6 weeks to allow for time for this. Today focused on AA/ROM exercises which she continues to require cuing to decrease Lt scapular compensations though this is some improved. Then continued with manual therapy working to improve her end ROM to allow for increased comfort and ease with radiation postioning.    Personal Factors and Comorbidities Age;Comorbidity 2;Comorbidity 3+    Comorbidities age, recent Cancer diagnosis Triple negative,Left shoulder limitations in AROM pre surgery    Examination-Activity Limitations Reach Overhead;Dressing    Stability/Clinical Decision Making Stable/Uncomplicated    Rehab Potential Good    PT Frequency 1x / week    PT Duration 4 weeks    PT Treatment/Interventions Therapeutic exercise;Manual techniques;Patient/family education;Manual lymph drainage;Passive range of motion;Joint Manipulations    PT Next Visit Plan If pt returns after radiation assess current functional status and resume manual therapy to improve end P/ROM and decrease fascial restrictions; cont A/AA/ROM for Lt shoulder    PT Home Exercise Plan 3 post op exs excluding wall walk, flex and ER done in supine    Consulted and Agree with Plan of Care Patient             Patient will benefit from skilled therapeutic intervention in order to improve the following deficits and impairments:  Decreased knowledge of precautions, Decreased range of motion, Decreased strength, Impaired UE functional use, Postural dysfunction  Visit Diagnosis: Aftercare following surgery for neoplasm  Stiffness of left shoulder, not elsewhere classified  Abnormal posture  Postural kyphosis of thoracic region  Malignant neoplasm of lower-inner quadrant of left  breast in female, estrogen receptor negative ([place])     Problem List Patient Active Problem List   Diagnosis Date Noted   Cancer of left female breast ([place]) 08/29/2020   Port-A-Cath in place 03/22/2020   Malignant neoplasm of upper-inner quadrant of left breast in female, estrogen receptor negative ([place]) 02/04/2020   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

## 2021-01-03 NOTE — Addendum Note (Signed)
Addended by: Claris Pong on: 01/03/2021 05:18 PM   Modules accepted: Orders

## 2021-01-04 ENCOUNTER — Ambulatory Visit: Payer: Medicare Other

## 2021-01-05 ENCOUNTER — Other Ambulatory Visit: Payer: Self-pay

## 2021-01-05 ENCOUNTER — Ambulatory Visit
Admission: RE | Admit: 2021-01-05 | Discharge: 2021-01-05 | Disposition: A | Discharge status: Home / Self Care | Payer: Medicare Other | Source: Ambulatory Visit | Attending: Radiation Oncology | Admitting: Radiation Oncology

## 2021-01-05 DIAGNOSIS — Z510 Encounter for antineoplastic radiation therapy: Secondary | ICD-10-CM | POA: Diagnosis not present

## 2021-01-06 ENCOUNTER — Ambulatory Visit: Payer: Medicare Other

## 2021-01-06 ENCOUNTER — Other Ambulatory Visit: Payer: Self-pay

## 2021-01-06 ENCOUNTER — Ambulatory Visit
Admission: RE | Admit: 2021-01-06 | Discharge: 2021-01-06 | Disposition: A | Discharge status: Home / Self Care | Payer: Medicare Other | Source: Ambulatory Visit | Attending: Radiation Oncology | Admitting: Radiation Oncology

## 2021-01-06 DIAGNOSIS — Z510 Encounter for antineoplastic radiation therapy: Secondary | ICD-10-CM | POA: Diagnosis not present

## 2021-01-09 ENCOUNTER — Ambulatory Visit: Payer: Medicare Other

## 2021-01-09 ENCOUNTER — Other Ambulatory Visit: Payer: Self-pay

## 2021-01-09 ENCOUNTER — Ambulatory Visit
Admission: RE | Admit: 2021-01-09 | Discharge: 2021-01-09 | Disposition: A | Discharge status: Home / Self Care | Payer: Medicare Other | Source: Ambulatory Visit | Attending: Radiation Oncology | Admitting: Radiation Oncology

## 2021-01-09 DIAGNOSIS — C50212 Malignant neoplasm of upper-inner quadrant of left female breast: Secondary | ICD-10-CM | POA: Diagnosis present

## 2021-01-09 DIAGNOSIS — Z510 Encounter for antineoplastic radiation therapy: Secondary | ICD-10-CM | POA: Insufficient documentation

## 2021-01-09 DIAGNOSIS — Z171 Estrogen receptor negative status [ER-]: Secondary | ICD-10-CM | POA: Insufficient documentation

## 2021-01-10 ENCOUNTER — Encounter: Payer: Self-pay | Admitting: *Deleted

## 2021-01-10 ENCOUNTER — Other Ambulatory Visit: Payer: Self-pay

## 2021-01-10 ENCOUNTER — Ambulatory Visit
Admission: RE | Admit: 2021-01-10 | Discharge: 2021-01-10 | Disposition: A | Discharge status: Home / Self Care | Payer: Medicare Other | Source: Ambulatory Visit | Attending: Radiation Oncology | Admitting: Radiation Oncology

## 2021-01-10 DIAGNOSIS — C50919 Malignant neoplasm of unspecified site of unspecified female breast: Secondary | ICD-10-CM

## 2021-01-10 DIAGNOSIS — C50212 Malignant neoplasm of upper-inner quadrant of left female breast: Secondary | ICD-10-CM

## 2021-01-10 DIAGNOSIS — Z171 Estrogen receptor negative status [ER-]: Secondary | ICD-10-CM

## 2021-01-10 MED ORDER — RADIAPLEXRX EX GEL
1.0000 "application " | Freq: Once | CUTANEOUS | Status: AC
  Administered 2021-01-10: 1 via TOPICAL

## 2021-01-11 ENCOUNTER — Ambulatory Visit
Admission: RE | Admit: 2021-01-11 | Discharge: 2021-01-11 | Disposition: A | Discharge status: Home / Self Care | Payer: Medicare Other | Source: Ambulatory Visit | Attending: Radiation Oncology | Admitting: Radiation Oncology

## 2021-01-11 DIAGNOSIS — C50212 Malignant neoplasm of upper-inner quadrant of left female breast: Secondary | ICD-10-CM | POA: Diagnosis not present

## 2021-01-12 ENCOUNTER — Ambulatory Visit
Admission: RE | Admit: 2021-01-12 | Discharge: 2021-01-12 | Disposition: A | Discharge status: Home / Self Care | Payer: Medicare Other | Source: Ambulatory Visit | Attending: Radiation Oncology | Admitting: Radiation Oncology

## 2021-01-12 ENCOUNTER — Ambulatory Visit: Payer: Medicare Other

## 2021-01-12 DIAGNOSIS — C50212 Malignant neoplasm of upper-inner quadrant of left female breast: Secondary | ICD-10-CM | POA: Diagnosis not present

## 2021-01-13 ENCOUNTER — Ambulatory Visit
Admission: RE | Admit: 2021-01-13 | Discharge: 2021-01-13 | Disposition: A | Discharge status: Home / Self Care | Payer: Medicare Other | Source: Ambulatory Visit | Attending: Radiation Oncology | Admitting: Radiation Oncology

## 2021-01-13 ENCOUNTER — Encounter: Payer: Self-pay | Admitting: Radiation Oncology

## 2021-01-13 DIAGNOSIS — C50919 Malignant neoplasm of unspecified site of unspecified female breast: Secondary | ICD-10-CM

## 2021-01-13 DIAGNOSIS — C50212 Malignant neoplasm of upper-inner quadrant of left female breast: Secondary | ICD-10-CM | POA: Diagnosis not present

## 2021-01-23 NOTE — Progress Notes (Signed)
[place]  [phone] [phone]     ID: Donna [name] DOB: Jan 25, 1946  MR#: [id number]  FXT#:[id number]  Patient Care Team: Montez Morita, PA-C as PCP - General (Hematology and Oncology) Rondal Vandevelde, [name], MD as Consulting Physician (Oncology) [name], MD as Consulting Physician (General Surgery) [name], MD as Consulting Physician (Orthopedic Surgery) [name], RN as Oncology Nurse Navigator [name], RN as Oncology Nurse Navigator Chauncey Cruel, MD OTHER MD: [name] MD ([place]); [name] NP   CHIEF COMPLAINT: 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  Since her last visit, she received postmastectomy radiation therapy under Dr. Isidore Moos from 12/01/2020 through 01/13/2021.  She says her symptoms were mild although she did have some skin changes.  She has continued to do all her usual activities and has not been particularly fatigued.   REVIEW OF SYSTEMS: Donna [name] denies unusual headaches visual changes cough phlegm production pleurisy shortness of breath or change in bowel or bladder habits.  A detailed review of systems was otherwise stable.   COVID 19 VACCINATION STATUS: s/p Moderna x2, with booster pending   HISTORY OF CURRENT ILLNESS: From the original intake note:  Donna [name] herself palpated a mass in her left breast during showering.  She brought this to medical attention and on 12/16/2019 underwent mammography, the results of which I do not have today.  She proceeded to left breast ultrasound showing breast density B.  There was a mass in the upper inner quadrant of the left breast at the 10 o'clock position 7 cm from the nipple.  It measured 3.26 cm.  There are no ultrasound comments regarding the axilla.  Biopsy of  the mass in question was performed 01/15/2020 at [facility] and the pathology report describes a biphasic tumor with malignant epithelial component and a mesenchymal component.  The epithelial component appears to squamoid and stain strongly for CK 8/18 by immunohistochemistry.  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  The tumor was essentially triple negative, estrogen receptor 0%, progesterone receptor 1% with 1+ intensity, HER-2 1+ by immunohistochemistry, Ki-67 12%.  (The 1% progesterone receptor positivity is best read as an artifact as the progesterone receptor does not function in the absence of a functional estrogen receptor).  The patient's subsequent history is as detailed below   PAST MEDICAL HISTORY: Past Medical History:  Diagnosis Date   Arthritis    oa   History of kidney stones    Hypertension     PAST SURGICAL HISTORY: Past Surgical History:  Procedure Laterality Date   CYSTOSCOPY  yrs ago   IR IMAGING GUIDED PORT INSERTION  02/16/2020   MASTECTOMY W/ SENTINEL NODE BIOPSY Left 08/29/2020   Procedure: LEFT MASTECTOMY WITH SENTINEL LYMPH NODE BIOPSY;  Surgeon: [name], MD;  Location: [facility];  Service: General;  Laterality: Left;  RNFA, PEC BLOCK   TOTAL KNEE ARTHROPLASTY Left 06/05/2016   Procedure: LEFT TOTAL KNEE ARTHROPLASTY;  Surgeon: [name], MD;  Location: [facility];  Service: Orthopedics;  Laterality: Left;    FAMILY HISTORY No family history on file.  The patient has no information on her father.  Her mother died at the age of 62.  She had 1 sister and  2 brothers.  No one on that side of the family is known to have had cancer.  The patient herself had 5/2 siblings, 2 sisters and 3 brothers, none with a history of cancer.   GYNECOLOGIC HISTORY:  No LMP recorded (lmp unknown). Patient is postmenopausal. Menarche: 75 years old Age at first live birth: 75  years old [place] P 2 LMP mid 36s Contraceptive no HRT no  Hysterectomy?  No Salpingo-oophorectomy?  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  She is divorced and her former husband subsequently died.  She lives by herself with no pets.  Her son Donna [name] 14 is a Production manager and travels a great deal as a Optometrist.  Daughter Donna [name] teaches middle school in [place].  The patient has 4 grandchildren, no great-grandchildren.  She is a Psychologist, forensic.    ADVANCED DIRECTIVES: Not in place   HEALTH MAINTENANCE: Social History   Tobacco Use   Smoking status: Never   Smokeless tobacco: Never  Vaping Use   Vaping Use: Never used  Substance Use Topics   Alcohol use: No   Drug use: No     Colonoscopy: Never  PAP: Remote  Bone density: Remote   Allergies  Allergen Reactions   Benazepril Swelling    Patient had severe angioedema on 10/05/2015.    Current Outpatient Medications  Medication Sig Dispense Refill   amLODipine (NORVASC) 10 MG tablet Take 1 tablet (10 mg total) by mouth daily after lunch. 90 tablet 0   chlorthalidone (HYGROTON) 25 MG tablet Take 1 tablet (25 mg total) by mouth daily. 90 tablet 0   losartan (COZAAR) 50 MG tablet Take 1 tablet (50 mg total) by mouth daily. 90 tablet 0   Multiple Vitamins-Minerals (CENTRUM ADULTS) TABS Take 1 tablet by mouth daily.     potassium chloride (MICRO-K) 10 MEQ CR capsule Take 10 mEq by mouth 2 (two) times daily.     No current facility-administered medications for this visit.    OBJECTIVE: African-American woman who appears stated age 53:   01/24/21 1026  BP: (!) 142/84  Pulse: 84  Resp: 18  Temp: (!) 97.4 F (36.3 C)  SpO2: 100%      Body mass index is 24.94 kg/m.   Wt Readings from Last 3 Encounters:  01/24/21 145 lb 4.8 oz (65.9 kg)  10/18/20 148 lb 4.8 oz (67.3 kg)  08/29/20 150 lb (68 kg)      ECOG FS:1 - Symptomatic but completely ambulatory  Sclerae  unicteric, EOMs intact Wearing a mask No cervical or supraclavicular adenopathy Lungs no rales or rhonchi Heart regular rate and rhythm Abd soft, nontender, positive bowel sounds MSK no focal spinal tenderness, no upper extremity lymphedema Neuro: nonfocal, well oriented, appropriate affect Breasts: The right breast is unremarkable.  The left breast is status post mastectomy and radiation.  There is minimal dry desquamation.  There is significant hyperpigmentation over the radiation port area.  The incision itself is irregular as noted previously but there is no evidence of local recurrence.  Both axillae are benign.   LAB RESULTS:  CMP     Component Value Date/Time   NA 141 08/29/2020 1409   K 3.3 (L) 08/29/2020 1409   CL 103 08/29/2020 1409   CO2 24 08/29/2020 1250   GLUCOSE 99 08/29/2020 1409   BUN 14 08/29/2020 1409   CREATININE 0.70 08/29/2020 1409   CREATININE 0.86 07/05/2020 0915  CALCIUM 11.0 (H) 08/29/2020 1250   PROT 7.0 07/05/2020 0915   ALBUMIN 3.7 07/05/2020 0915   AST 14 (L) 07/05/2020 0915   ALT 11 07/05/2020 0915   ALKPHOS 86 07/05/2020 0915   BILITOT 0.4 07/05/2020 0915   GFRNONAA >60 08/29/2020 1250   GFRNONAA >60 07/05/2020 0915   GFRAA >60 03/08/2020 0906    No results found for: TOTALPROTELP, ALBUMINELP, A1GS, A2GS, BETS, BETA2SER, GAMS, MSPIKE, SPEI  No results found for: KPAFRELGTCHN, LAMBDASER, KAPLAMBRATIO  Lab Results  Component Value Date   WBC 3.2 (L) 01/24/2021   NEUTROABS 2.0 01/24/2021   HGB 15.4 (H) 01/24/2021   HCT 46.1 (H) 01/24/2021   MCV 86.8 01/24/2021   PLT 221 01/24/2021      Chemistry      Component Value Date/Time   NA 141 08/29/2020 1409   K 3.3 (L) 08/29/2020 1409   CL 103 08/29/2020 1409   CO2 24 08/29/2020 1250   BUN 14 08/29/2020 1409   CREATININE 0.70 08/29/2020 1409   CREATININE 0.86 07/05/2020 0915      Component Value Date/Time   CALCIUM 11.0 (H) 08/29/2020 1250   ALKPHOS 86 07/05/2020 0915   AST 14 (L)  07/05/2020 0915   ALT 11 07/05/2020 0915   BILITOT 0.4 07/05/2020 0915       No results found for: LABCA2  No components found for: QBHALP379  No results for input(s): INR in the last 168 hours.  No results found for: LABCA2  No results found for: KWI097  No results found for: DZH299  No results found for: MEQ683  No results found for: CA2729  No components found for: HGQUANT  No results found for: CEA1 / No results found for: CEA1   No results found for: AFPTUMOR  No results found for: CHROMOGRNA  No results found for: HGBA, HGBA2QUANT, HGBFQUANT, HGBSQUAN (Hemoglobinopathy evaluation)   No results found for: LDH  No results found for: IRON, TIBC, IRONPCTSAT (Iron and TIBC)  No results found for: FERRITIN  Urinalysis    Component Value Date/Time   COLORURINE YELLOW 05/24/2020 0950   APPEARANCEUR HAZY (A) 05/24/2020 0950   LABSPEC 1.015 05/24/2020 0950   PHURINE 6.0 05/24/2020 0950   GLUCOSEU NEGATIVE 05/24/2020 0950   HGBUR NEGATIVE 05/24/2020 [place] 05/24/2020 0950   KETONESUR NEGATIVE 05/24/2020 0950   PROTEINUR 100 (A) 05/24/2020 0950   NITRITE NEGATIVE 05/24/2020 0950   LEUKOCYTESUR SMALL (A) 05/24/2020 0950    STUDIES: No results found.   ELIGIBLE FOR AVAILABLE RESEARCH PROTOCOL: no  ASSESSMENT: 75 y.o. [place] woman status post left breast upper inner quadrant biopsy 01/15/2020 for a clinical T2 NX, stage IIB anaplastic carcinoma, triple negative, with an MIB-1 of 12%  (a) bone scan and CT chest 02/12/2020 show no evidence of metastatic disease  (1) neoadjuvant chemotherapy with doxorubicin and cyclophosphamide in dose dense fashion x4 started 02/23/2020, followed by weekly paclitaxel and carboplatin x12 starting 04/19/2020  (a) echo 02/12/2020 shows an ejection fraction in the 60-65% range  (2) left mastectomy and sentinel lymph node sampling 08/29/2020 showed a residual ypT2 ypN0 metaplastic carcinoma,  grade 3, with negative margins.  (a) a total of 2 left axillary lymph nodes were removed  (b) repeat prognostic panel shows the tumor to be estrogen receptor negative but progesterone receptor positive at 5% with strong staining intensity.  HER2 was equivocal by immunotherapy but negative by FISH  (3) adjuvant radiation 12/01/2020 - 01/13/2021  (4) to start  anastrozole 02/14/2021   PLAN: Mckenzy has completed her local treatment.  She understands that the desquamation will clear quickly but the hyperpigmentation will take many months perhaps over a year to clear.  I wrote her a prescription for bras and prostheses so she may explore which is best for her.  She is now ready to consider antiestrogens.  Her cancer was essentially triple negative but there was a hint of possible estrogen positivity (the progesterone receptor was 5% and strong).  I think it is worth trying anastrozole assuming she tolerates it well.  Today we talked about the possible toxicities side effects and complications of this agent including concerns regarding bone density.  She does not have a bone density on file so I have set her up for one of the end of October and she will have a right screening mammography at the same time.  She will then see me in November.  If she is tolerating anastrozole well the plan will be to continue that 5 years, and if not then likely we will follow her off treatment  Total encounter time 25 minutes.Chauncey Cruel, MD   01/24/2021 10:43 AM Medical Oncology and Hematology [facility] [place], [place] [address] Tel. [phone]    Fax. [phone]   I, Wilburn Mylar, am acting as scribe for Dr. [name]. Donna [name].  I, Lurline Del MD, have reviewed the above documentation for accuracy and completeness, and I agree with the above.   *Total Encounter Time as defined by the Centers for Medicare and Medicaid Services includes, in addition to the  face-to-face time of a patient visit (documented in the note above) non-face-to-face time: obtaining and reviewing outside history, ordering and reviewing medications, tests or procedures, care coordination (communications with other health care professionals or caregivers) and documentation in the medical record.

## 2021-01-24 ENCOUNTER — Inpatient Hospital Stay: Payer: Medicare Other | Attending: Oncology

## 2021-01-24 ENCOUNTER — Inpatient Hospital Stay (HOSPITAL_BASED_OUTPATIENT_CLINIC_OR_DEPARTMENT_OTHER): Payer: Medicare Other | Admitting: Oncology

## 2021-01-24 ENCOUNTER — Other Ambulatory Visit: Payer: Self-pay

## 2021-01-24 VITALS — BP 142/84 | HR 84 | Temp 97.4°F | Resp 18 | Ht 64.0 in | Wt 145.3 lb

## 2021-01-24 DIAGNOSIS — Z79811 Long term (current) use of aromatase inhibitors: Secondary | ICD-10-CM | POA: Diagnosis not present

## 2021-01-24 DIAGNOSIS — C50919 Malignant neoplasm of unspecified site of unspecified female breast: Secondary | ICD-10-CM | POA: Diagnosis not present

## 2021-01-24 DIAGNOSIS — Z9012 Acquired absence of left breast and nipple: Secondary | ICD-10-CM | POA: Diagnosis not present

## 2021-01-24 DIAGNOSIS — C50212 Malignant neoplasm of upper-inner quadrant of left female breast: Secondary | ICD-10-CM | POA: Insufficient documentation

## 2021-01-24 DIAGNOSIS — Z79899 Other long term (current) drug therapy: Secondary | ICD-10-CM | POA: Diagnosis not present

## 2021-01-24 DIAGNOSIS — I10 Essential (primary) hypertension: Secondary | ICD-10-CM | POA: Insufficient documentation

## 2021-01-24 DIAGNOSIS — Z171 Estrogen receptor negative status [ER-]: Secondary | ICD-10-CM | POA: Insufficient documentation

## 2021-01-24 LAB — CMP (CANCER CENTER ONLY)
ALT: 15 U/L (ref 0–44)
AST: 15 U/L (ref 15–41)
Albumin: 3.9 g/dL (ref 3.5–5.0)
Alkaline Phosphatase: 93 U/L (ref 38–126)
Anion gap: 9 (ref 5–15)
BUN: 17 mg/dL (ref 8–23)
CO2: 27 mmol/L (ref 22–32)
Calcium: 11.1 mg/dL — ABNORMAL HIGH (ref 8.9–10.3)
Chloride: 101 mmol/L (ref 98–111)
Creatinine: 0.88 mg/dL (ref 0.44–1.00)
GFR, Estimated: >60 mL/min (ref >60)
Glucose, Bld: 98 mg/dL (ref 70–99)
Potassium: 3.1 mmol/L — ABNORMAL LOW (ref 3.5–5.1)
Sodium: 137 mmol/L (ref 135–145)
Total Bilirubin: 0.4 mg/dL (ref 0.3–1.2)
Total Protein: 7.7 g/dL (ref 6.5–8.1)

## 2021-01-24 LAB — CBC WITH DIFFERENTIAL (CANCER CENTER ONLY)
Abs Immature Granulocytes: 0 10*3/uL (ref 0.00–0.07)
Basophils Absolute: 0 10*3/uL (ref 0.0–0.1)
Basophils Relative: 0 %
Eosinophils Absolute: 0 10*3/uL (ref 0.0–0.5)
Eosinophils Relative: 0 %
HCT: 46.1 % — ABNORMAL HIGH (ref 36.0–46.0)
Hemoglobin: 15.4 g/dL — ABNORMAL HIGH (ref 12.0–15.0)
Immature Granulocytes: 0 %
Lymphocytes Relative: 27 %
Lymphs Abs: 0.9 10*3/uL (ref 0.7–4.0)
MCH: 29 pg (ref 26.0–34.0)
MCHC: 33.4 g/dL (ref 30.0–36.0)
MCV: 86.8 fL (ref 80.0–100.0)
Monocytes Absolute: 0.3 10*3/uL (ref 0.1–1.0)
Monocytes Relative: 9 %
Neutro Abs: 2 10*3/uL (ref 1.7–7.7)
Neutrophils Relative %: 64 %
Platelet Count: 221 10*3/uL (ref 150–400)
RBC: 5.31 MIL/uL — ABNORMAL HIGH (ref 3.87–5.11)
RDW: 13.2 % (ref 11.5–15.5)
WBC Count: 3.2 10*3/uL — ABNORMAL LOW (ref 4.0–10.5)
nRBC: 0 % (ref 0.0–0.2)

## 2021-01-24 MED ORDER — ANASTROZOLE 1 MG PO TABS: 1.0000 mg | ORAL_TABLET | Freq: Every day | ORAL | 4 refills | Status: DC

## 2021-02-17 ENCOUNTER — Other Ambulatory Visit: Payer: Self-pay

## 2021-02-17 ENCOUNTER — Encounter: Payer: Self-pay | Admitting: Radiation Oncology

## 2021-02-17 ENCOUNTER — Ambulatory Visit
Admission: RE | Admit: 2021-02-17 | Discharge: 2021-02-17 | Disposition: A | Discharge status: Home / Self Care | Payer: Medicare Other | Source: Ambulatory Visit | Attending: Radiation Oncology | Admitting: Radiation Oncology

## 2021-02-17 DIAGNOSIS — C50212 Malignant neoplasm of upper-inner quadrant of left female breast: Secondary | ICD-10-CM | POA: Insufficient documentation

## 2021-02-17 DIAGNOSIS — Z79899 Other long term (current) drug therapy: Secondary | ICD-10-CM | POA: Insufficient documentation

## 2021-02-17 DIAGNOSIS — Z171 Estrogen receptor negative status [ER-]: Secondary | ICD-10-CM | POA: Diagnosis not present

## 2021-02-17 DIAGNOSIS — Z923 Personal history of irradiation: Secondary | ICD-10-CM | POA: Diagnosis not present

## 2021-02-17 NOTE — Progress Notes (Signed)
Radiation Oncology         [phone] ________________________________  Name: Donna [name] MRN: [id number]  Date: 02/17/2021  DOB: 09/24/1945  Follow-Up Visit Note  Outpatient  CC: Montez Morita, PA-C  Magrinat, [name], MD  Diagnosis and Prior Radiotherapy:    ICD-10-CM   1. Malignant neoplasm of upper-inner quadrant of left breast in female, estrogen receptor negative ([place])  C50.212    Z17.1       CHIEF COMPLAINT: Here for follow-up and surveillance of breast cancer  Narrative:  The patient returns today for routine follow-up.  She is doing well.  She is with her son today.  Skin has healed well.  She ran out of her lotion.   She has a physical therapy appointment scheduled, but her son acknowledges that it is difficult for her to do all the exercises at home and she has been losing some range of motion.                              ALLERGIES:  is allergic to benazepril.  Meds: Current Outpatient Medications  Medication Sig Dispense Refill   amLODipine (NORVASC) 10 MG tablet Take 1 tablet (10 mg total) by mouth daily after lunch. 90 tablet 0   anastrozole (ARIMIDEX) 1 MG tablet Take 1 tablet (1 mg total) by mouth daily. Start February 14, 2021 90 tablet 4   losartan (COZAAR) 50 MG tablet Take 1 tablet (50 mg total) by mouth daily. 90 tablet 0   Multiple Vitamins-Minerals (CENTRUM ADULTS) TABS Take 1 tablet by mouth daily.     potassium chloride (MICRO-K) 10 MEQ CR capsule Take 10 mEq by mouth 2 (two) times daily.     chlorthalidone (HYGROTON) 25 MG tablet Take 1 tablet (25 mg total) by mouth daily. (Patient not taking: Reported on 02/17/2021) 90 tablet 0   No current facility-administered medications for this encounter.    Physical Findings: The patient is in no acute distress. Patient is alert and oriented.  height is '5\' 5"'$  (1.651 m) and weight is 147 lb 12.8 oz (67 kg). Her temperature is 97.8 F (36.6 C). Her blood pressure is 151/92 (abnormal) and her pulse is  94. Her respiration is 20 and oxygen saturation is 100%. .    Satisfactory skin healing in radiotherapy fields.  There is some residual hyperpigmentation over the left chest wall.  Skin is intact.  No concerning nodularity   Lab Findings: Lab Results  Component Value Date   WBC 3.2 (L) 01/24/2021   HGB 15.4 (H) 01/24/2021   HCT 46.1 (H) 01/24/2021   MCV 86.8 01/24/2021   PLT 221 01/24/2021    Radiographic Findings: No results found.  Impression/Plan: Healing well from radiotherapy to the breast tissue.  Continue skin care with topical Vitamin E Oil and / or lotion for at least 2 more months for further healing.  I encouraged her to continue with yearly mammography as appropriate (for intact breast tissue, this has been scheduled) and followup with medical oncology. I will see her back on an as-needed basis. I have encouraged her to call if she has any issues or concerns in the future. I wished her the very best.  We talked about doing as much of the physical therapy exercises at home as realistic and that some exercises are better than none at all.  She will see her physical therapist in the near future for a session.  On date of service, in total, I spent 20 minutes on this encounter. Patient was seen in person.  _____________________________________   Eppie Gibson, MD

## 2021-02-17 NOTE — Progress Notes (Signed)
Donna [name] is here today for follow up post radiation to the breast.   Breast Side:left side    They completed their radiation on: 01/13/2021  Does the patient complain of any of the following: Post radiation skin issues: Patient reports having some mild peeling.  Breast Tenderness: mild Breast Swelling: no Lymphadema: no Range of Motion limitations: Patient reports having limited range of motion to left arm.  Fatigue post radiation: Patient reports having a good energy level.  Appetite good/fair/poor:  good  Additional comments if applicable:   Vitals:   02/17/21 1432  BP: (!) 151/92  Pulse: 94  Resp: 20  Temp: 97.8 F (36.6 C)  SpO2: 100%  Weight: 147 lb 12.8 oz (67 kg)  Height: '5\' 5"'$  (1.651 m)

## 2021-02-21 ENCOUNTER — Other Ambulatory Visit: Payer: Self-pay | Admitting: *Deleted

## 2021-02-21 MED ORDER — ANASTROZOLE 1 MG PO TABS: 1.0000 mg | ORAL_TABLET | Freq: Every day | ORAL | 1 refills | Status: DC

## 2021-03-03 ENCOUNTER — Telehealth: Payer: Self-pay | Admitting: Adult Health

## 2021-03-03 NOTE — Telephone Encounter (Signed)
LMOM with patient that it is time to get her SCP visit scheduled.  Gave her my call back number so we can discuss further and get this arranged.  Wilber Bihari, NP

## 2021-03-06 ENCOUNTER — Ambulatory Visit: Payer: Self-pay

## 2021-03-16 ENCOUNTER — Other Ambulatory Visit: Payer: Self-pay

## 2021-03-16 ENCOUNTER — Other Ambulatory Visit: Payer: Self-pay | Admitting: Oncology

## 2021-03-16 ENCOUNTER — Ambulatory Visit
Admission: RE | Admit: 2021-03-16 | Discharge: 2021-03-16 | Disposition: A | Discharge status: Home / Self Care | Payer: Medicare Other | Source: Ambulatory Visit | Attending: Oncology | Admitting: Oncology

## 2021-03-16 DIAGNOSIS — C50919 Malignant neoplasm of unspecified site of unspecified female breast: Secondary | ICD-10-CM

## 2021-03-16 DIAGNOSIS — C50212 Malignant neoplasm of upper-inner quadrant of left female breast: Secondary | ICD-10-CM

## 2021-03-16 DIAGNOSIS — Z171 Estrogen receptor negative status [ER-]: Secondary | ICD-10-CM

## 2021-03-16 HISTORY — DX: Personal history of irradiation: Z92.3

## 2021-03-16 HISTORY — DX: Personal history of antineoplastic chemotherapy: Z92.21

## 2021-03-20 ENCOUNTER — Other Ambulatory Visit: Payer: Self-pay

## 2021-03-20 ENCOUNTER — Ambulatory Visit: Payer: Medicare Other | Attending: Radiation Oncology

## 2021-03-20 VITALS — Wt 150.0 lb

## 2021-03-20 DIAGNOSIS — Z483 Aftercare following surgery for neoplasm: Secondary | ICD-10-CM | POA: Insufficient documentation

## 2021-03-20 NOTE — Patient Instructions (Signed)
Flexion (Eccentric) - Active-Assist (Cane)          Cancer Rehab [phone]    Use unaffected arm to push affected arm forward. Avoid hiking shoulder (shoulder should NOT touch cheek). Keep palm relaxed. Slowly lower affected arm. Hold stretch for _5_ seconds repeating _5-10_ times, _1-2_ times a day.  Abduction (Eccentric) - Active-Assist (Cane)    Use unaffected arm to push affected arm out to side. Avoid hiking shoulder (shoulder should NOT touch cheek). Keep palm relaxed. Slowly lower affected arm. Hold stretch _5_ seconds repeating _5-10_ times, _1-2_ times a day.  CHEST: Doorway, Bilateral - Standing    Standing in doorway, place hands on wall with elbows bent at shoulder height and place one foot in front of other. Shift weight onto front foot. Hold _10-20__ seconds. Do _3-5_ times, _1-2_ times a day.

## 2021-03-20 NOTE — Therapy (Signed)
[facility] @ [place], [place], [address] Phone: [phone]   Fax:  [phone]  Physical Therapy Treatment  Patient Details  Name: Donna [name] MRN: [id number] Date of Birth: 07/11/1945 Referring Provider (PT): Dr. Toth/Dr. Isidore Moos   Encounter Date: 03/20/2021   PT End of Session - 03/20/21 1045     Visit Number 18   # unchanged due to screen only   PT Start Time 6283    PT Stop Time 1058    PT Time Calculation (min) 17 min    Activity Tolerance Patient tolerated treatment well    Behavior During Therapy [facility] for tasks assessed/performed             Past Medical History:  Diagnosis Date   Arthritis    oa   History of kidney stones    Hypertension    Personal history of chemotherapy    Personal history of radiation therapy     Past Surgical History:  Procedure Laterality Date   BREAST BIOPSY     CYSTOSCOPY  yrs ago   IR IMAGING GUIDED PORT INSERTION  02/16/2020   MASTECTOMY     MASTECTOMY W/ SENTINEL NODE BIOPSY Left 08/29/2020   Procedure: LEFT MASTECTOMY WITH SENTINEL LYMPH NODE BIOPSY;  Surgeon: [name], MD;  Location: [facility];  Service: General;  Laterality: Left;  RNFA, PEC BLOCK   TOTAL KNEE ARTHROPLASTY Left 06/05/2016   Procedure: LEFT TOTAL KNEE ARTHROPLASTY;  Surgeon: [name], MD;  Location[facility];  Service: Orthopedics;  Laterality: Left;    Vitals:   03/20/21 1045  Weight: 150 lb (68 kg)     Subjective Assessment - 03/20/21 1043     Subjective Pt returns for her 3 month L-Dex screen.    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  She is presently staying in town with her children but is looking forward to going home to [place].                    L-DEX FLOWSHEETS - 03/20/21 1000       L-DEX LYMPHEDEMA SCREENING   Measurement Type Unilateral    L-DEX  MEASUREMENT EXTREMITY Upper Extremity    POSITION  Standing    DOMINANT SIDE Right    At Risk Side Left    BASELINE SCORE (UNILATERAL) -5.9    L-DEX SCORE (UNILATERAL) -4.9    VALUE CHANGE (UNILAT) 1                                PT Education - 03/20/21 1054     Education Details Standing dowel exercises    Person(s) Educated Patient;Child(ren)   pts son   Methods Explanation;Handout    Comprehension Verbalized understanding                 PT Long Term Goals - 01/03/21 1306       PT LONG TERM GOAL #1   Title Pt will be independent and compliant with a HEP for left shoulder ROM/strength    Baseline compliant but requires assist in PT to do correctly; pt doing better with this but does still require min VCs to decresae scapular compensations-01/03/21    Status Partially Met      PT LONG TERM GOAL #2   Title Pts PROM/AA  ROM left shoulder improved to 130 deg  flexion    Baseline achieved today, but varies and only after stretching; AA/ROM flex 120 degrees - 01/03/21    Status Partially Met      PT LONG TERM GOAL #3   Title Pt able maintain  her arm in proper position for radiation    Baseline pt is able to maintain radiation positioning in a modified position by her putting her hand on her head-01/03/21    Status Achieved                   Plan - 03/20/21 1046     Clinical Impression Statement Pt returns for her 3 month L-Dex screen. Her change from baseline of 1 is WNLS so no further treatment is required at this time except to cont every 3 month L-Dex screens which pt is agreeable to. Pts son did bring up that pt has been c/o tightness in her Lt shoulder so encouraged pt to work on becoming more consistent with her HEP and reissued a handout per their request. They know they can call clinic to resume physical therapy at any time prn.    PT Next Visit Plan Cont every 3 month L-Dex screens for up to 2 years from her SLNB (~02/22/22)     Consulted and Agree with Plan of Care Patient             Patient will benefit from skilled therapeutic intervention in order to improve the following deficits and impairments:     Visit Diagnosis: Aftercare following surgery for neoplasm     Problem List Patient Active Problem List   Diagnosis Date Noted   Cancer of left female breast ([place]) 08/29/2020   Port-A-Cath in place 03/22/2020   Malignant neoplasm of upper-inner quadrant of left breast in female, estrogen receptor negative ([place]) 02/04/2020   Metaplastic carcinoma of breast ([place]) 02/04/2020   Overweight (BMI 25.0-29.9) 06/06/2016   Hypokalemia 06/06/2016   S/P left TKA 06/05/2016    Otelia Limes, PTA 03/20/2021, 11:29 AM  [facility] @ [place], [place], [address] Phone: [phone]   Fax:  [phone]  Name: Donna [name] MRN: [id number] Date of Birth: 08-08-1945

## 2021-03-30 ENCOUNTER — Other Ambulatory Visit: Payer: Self-pay | Admitting: *Deleted

## 2021-03-30 DIAGNOSIS — C50912 Malignant neoplasm of unspecified site of left female breast: Secondary | ICD-10-CM

## 2021-03-30 DIAGNOSIS — I10 Essential (primary) hypertension: Secondary | ICD-10-CM

## 2021-03-30 MED ORDER — CHLORTHALIDONE 25 MG PO TABS: 25.0000 mg | ORAL_TABLET | Freq: Every day | ORAL | 0 refills | Status: DC

## 2021-03-30 MED ORDER — LOSARTAN POTASSIUM 50 MG PO TABS: 50.0000 mg | ORAL_TABLET | Freq: Every day | ORAL | 0 refills | Status: DC

## 2021-03-30 MED ORDER — AMLODIPINE BESYLATE 10 MG PO TABS: 10.0000 mg | ORAL_TABLET | Freq: Every day | ORAL | 0 refills | Status: DC

## 2021-04-10 NOTE — Progress Notes (Signed)
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  She started anastrozole on 02/14/2021.  She is tolerating it well except she says when she takes the medication she feels like pins-and-needles very briefly.  She thinks she may have missed some doses.  She does not take all her medicine at 1 particular time and does not really keep tabs of what she is taking.  She is not having problems with hot flashes or vaginal dryness.  She is obtaining the drug at a very little cost  Since her last visit, she underwent right screening mammography with tomography at [facility] on 03/16/2021 showing: breast density category B; no evidence of malignancy.  She is scheduled for bone density screening on 07/20/2021.    REVIEW OF SYSTEMS: Donna [name] does all her housework and also take some walks outside.  She is normally active for her age she says.  A detailed review of systems today was otherwise noncontributory   COVID 19 VACCINATION STATUS: s/p Moderna x2   HISTORY OF CURRENT ILLNESS: From the original intake note:  Donna [name] herself palpated a mass in her left breast during showering.  She brought this to medical attention  and on 12/16/2019 underwent mammography, the results of which I do not have today.  She proceeded to left breast ultrasound showing breast density B.  There was a mass in the upper inner quadrant of the left breast at the 10 o'clock position 7 cm from the nipple.  It measured 3.26 cm.  There are no ultrasound comments regarding the axilla.  Biopsy of the mass in question was performed 01/15/2020 at [facility] in [facility] [address] and the pathology report describes a biphasic tumor with malignant epithelial component and a mesenchymal component.  The epithelial component appears to squamoid and stain strongly for CK 8/18 by immunohistochemistry.  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  The tumor was essentially triple negative, estrogen receptor 0%, progesterone receptor 1% with 1+ intensity, HER-2 1+ by immunohistochemistry, Ki-67 12%.  (The 1% progesterone receptor positivity is best read as an artifact as the progesterone receptor does not function in the absence of a functional estrogen receptor).  The patient's subsequent history is as detailed below   PAST MEDICAL HISTORY: Past Medical History:  Diagnosis Date   Arthritis    oa   History of kidney stones    Hypertension    Personal history of chemotherapy    Personal history of radiation therapy     PAST SURGICAL HISTORY: Past Surgical History:  Procedure Laterality Date   BREAST BIOPSY     CYSTOSCOPY  yrs ago   IR IMAGING GUIDED PORT INSERTION  02/16/2020   MASTECTOMY     MASTECTOMY W/ SENTINEL NODE  BIOPSY Left 08/29/2020   Procedure: LEFT MASTECTOMY WITH SENTINEL LYMPH NODE BIOPSY;  Surgeon: Donna Kussmaul, MD;  Location: [place];  Service: General;  Laterality: Left;  RNFA, PEC BLOCK   TOTAL KNEE ARTHROPLASTY Left 06/05/2016   Procedure: LEFT TOTAL KNEE ARTHROPLASTY;  Surgeon: Donna Cancel, MD;  Location[facility];  Service: Orthopedics;  Laterality:  Left;    FAMILY HISTORY No family history on file.  The patient has no information on her father.  Her mother died at the age of 34.  She had 1 sister and 2 brothers.  No one on that side of the family is known to have had cancer.  The patient herself had 5/2 siblings, 2 sisters and 3 brothers, none with a history of cancer.   GYNECOLOGIC HISTORY:  No LMP recorded (lmp unknown). Patient is postmenopausal. Menarche: 75 years old Age at first live birth: 75 years old [place] P 2 LMP mid 51s Contraceptive no HRT no  Hysterectomy?  No Salpingo-oophorectomy?  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  She is divorced and her former husband subsequently died.  She lives by herself with no pets.  Her son Donna [name] 72 is a Production manager and travels a great deal as a Optometrist.  Daughter Donna [name] teaches middle school in [place].  The patient has 4 grandchildren.  She is a Psychologist, forensic.    ADVANCED DIRECTIVES: Not in place   HEALTH MAINTENANCE: Social History   Tobacco Use   Smoking status: Never   Smokeless tobacco: Never  Vaping Use   Vaping Use: Never used  Substance Use Topics   Alcohol use: No   Drug use: No     Colonoscopy: Never  PAP: Remote  Bone density: Remote   Allergies  Allergen Reactions   Benazepril Swelling    Patient had severe angioedema on 10/05/2015.    Current Outpatient Medications  Medication Sig Dispense Refill   amLODipine (NORVASC) 10 MG tablet Take 1 tablet (10 mg total) by mouth daily after lunch. 90 tablet 0   anastrozole (ARIMIDEX) 1 MG tablet TAKE 1 TABLET (1 MG TOTAL) BY MOUTH DAILY. START February 14, 2021 30 tablet 1   chlorthalidone (HYGROTON) 25 MG tablet Take 1 tablet (25 mg total) by mouth daily. 90 tablet 0   losartan (COZAAR) 50 MG tablet Take 1 tablet (50 mg total) by mouth daily. 90 tablet 0   Multiple Vitamins-Minerals (CENTRUM ADULTS) TABS Take 1 tablet by mouth daily.      potassium chloride (MICRO-K) 10 MEQ CR capsule Take 10 mEq by mouth 2 (two) times daily.     No current facility-administered medications for this visit.    OBJECTIVE: African-American woman who appears stated age 33:   04/11/21 0922  BP: (!) 147/87  Pulse: 92  Resp: 18  Temp: (!) 97 F (36.1 C)  SpO2: 99%      Body mass index is 25.16 kg/m.   Wt Readings from Last 3 Encounters:  04/11/21 151 lb 3.2 oz (68.6 kg)  03/20/21 150 lb (68 kg)  02/17/21 147 lb 12.8 oz (67 kg)      ECOG FS:1 - Symptomatic but completely ambulatory  Sclerae unicteric, EOMs intact Wearing a mask No cervical or supraclavicular adenopathy Lungs no rales or rhonchi Heart regular rate and rhythm Abd soft, nontender, positive bowel sounds MSK no focal spinal tenderness, no upper extremity lymphedema Neuro: nonfocal, well oriented, appropriate affect Breasts:  The right breast is benign.  The left breast is status postmastectomy and radiation.  There is some hyperpigmentation.  There is no evidence of local recurrence.  Both axillae are benign.   LAB RESULTS:  CMP     Component Value Date/Time   NA 137 01/24/2021 1013   K 3.1 (L) 01/24/2021 1013   CL 101 01/24/2021 1013   CO2 27 01/24/2021 1013   GLUCOSE 98 01/24/2021 1013   BUN 17 01/24/2021 1013   CREATININE 0.88 01/24/2021 1013   CALCIUM 11.1 (H) 01/24/2021 1013   PROT 7.7 01/24/2021 1013   ALBUMIN 3.9 01/24/2021 1013   AST 15 01/24/2021 1013   ALT 15 01/24/2021 1013   ALKPHOS 93 01/24/2021 1013   BILITOT 0.4 01/24/2021 1013   GFRNONAA >60 01/24/2021 1013   GFRAA >60 03/08/2020 0906    No results found for: TOTALPROTELP, ALBUMINELP, A1GS, A2GS, BETS, BETA2SER, GAMS, MSPIKE, SPEI  No results found for: Nils Pyle, KAPLAMBRATIO  Lab Results  Component Value Date   WBC 3.8 (L) 04/11/2021   NEUTROABS 2.0 04/11/2021   HGB 15.8 (H) 04/11/2021   HCT 47.2 (H) 04/11/2021   MCV 86.8 04/11/2021   PLT 225 04/11/2021       Chemistry      Component Value Date/Time   NA 137 01/24/2021 1013   K 3.1 (L) 01/24/2021 1013   CL 101 01/24/2021 1013   CO2 27 01/24/2021 1013   BUN 17 01/24/2021 1013   CREATININE 0.88 01/24/2021 1013      Component Value Date/Time   CALCIUM 11.1 (H) 01/24/2021 1013   ALKPHOS 93 01/24/2021 1013   AST 15 01/24/2021 1013   ALT 15 01/24/2021 1013   BILITOT 0.4 01/24/2021 1013       No results found for: LABCA2  No components found for: UUVOZD664  No results for input(s): INR in the last 168 hours.  No results found for: LABCA2  No results found for: QIH474  No results found for: QVZ563  No results found for: OVF643  No results found for: CA2729  No components found for: HGQUANT  No results found for: CEA1 / No results found for: CEA1   No results found for: AFPTUMOR  No results found for: CHROMOGRNA  No results found for: HGBA, HGBA2QUANT, HGBFQUANT, HGBSQUAN (Hemoglobinopathy evaluation)   No results found for: LDH  No results found for: IRON, TIBC, IRONPCTSAT (Iron and TIBC)  No results found for: FERRITIN  Urinalysis    Component Value Date/Time   COLORURINE YELLOW 05/24/2020 0950   APPEARANCEUR HAZY (A) 05/24/2020 0950   LABSPEC 1.015 05/24/2020 0950   PHURINE 6.0 05/24/2020 0950   GLUCOSEU NEGATIVE 05/24/2020 0950   HGBUR NEGATIVE 05/24/2020 0950   BILIRUBINUR NEGATIVE 05/24/2020 0950   KETONESUR NEGATIVE 05/24/2020 0950   PROTEINUR 100 (A) 05/24/2020 0950   NITRITE NEGATIVE 05/24/2020 0950   LEUKOCYTESUR SMALL (A) 05/24/2020 0950    STUDIES: MM 3D SCREEN BREAST UNI RIGHT  Result Date: 03/20/2021 CLINICAL DATA:  Screening. EXAM: DIGITAL SCREENING UNILATERAL RIGHT MAMMOGRAM WITH CAD AND TOMOSYNTHESIS TECHNIQUE: Right screening digital craniocaudal and mediolateral oblique mammograms were obtained. Right screening digital breast tomosynthesis was performed. The images were evaluated with computer-aided detection. COMPARISON:  Previous  exam(s). ACR Breast Density Category b: There are scattered areas of fibroglandular density. FINDINGS: There are no findings suspicious for malignancy. IMPRESSION: No mammographic evidence of malignancy. A result letter of this screening mammogram will be mailed directly to the patient. RECOMMENDATION: Screening mammogram  in one year. (Code:SM-B-01Y) BI-RADS CATEGORY  1: Negative. Electronically Signed   By: Lajean Manes M.D.   On: 03/20/2021 10:50    ELIGIBLE FOR AVAILABLE RESEARCH PROTOCOL: no  ASSESSMENT: 75 y.o. [place] woman status post left breast upper inner quadrant biopsy 01/15/2020 for a clinical T2 NX, stage IIB anaplastic carcinoma, triple negative, with an MIB-1 of 12%  (a) bone scan and CT chest 02/12/2020 show no evidence of metastatic disease  (1) neoadjuvant chemotherapy with doxorubicin and cyclophosphamide in dose dense fashion x4 started 02/23/2020, followed by weekly paclitaxel and carboplatin x12 starting 04/19/2020  (a) echo 02/12/2020 shows an ejection fraction in the 60-65% range  (2) left mastectomy and sentinel lymph node sampling 08/29/2020 showed a residual ypT2 ypN0 metaplastic carcinoma, grade 3, with negative margins.  (a) a total of 2 left axillary lymph nodes were removed  (b) repeat prognostic panel shows the tumor to be estrogen receptor negative but progesterone receptor positive at 5% with strong staining intensity.  HER2 was equivocal by immunotherapy but negative by FISH  (3) adjuvant radiation 12/01/2020 - 01/13/2021  (4) started anastrozole 02/14/2021  (5) consider [place] if disease recurrence   PLAN: Mell is now a little over half year out from definitive surgery for her breast cancer.  There is no evidence of disease recurrence.  She is receiving anastrozole because of her progesterone receptor positivity and the final pathology.  She is tolerating this generally well and the plan will be to continue that a total of 5 years.  She  still has her port in place and she has not made up her mind to remove it.  She is so afraid of any further surgery.  I put her in for a flush in 8 weeks and of course she will have lab work when she returns to see [place] in March.  She will be due for a bone density in February.  We will see her of the month following to discuss those results  Total encounter time 25 minutes.Chauncey Cruel, MD   04/11/2021 9:45 AM Medical Oncology and Hematology [facility] [place], [place] [address] Tel. [phone]    Fax. [phone]   I, Wilburn Mylar, am acting as scribe for Dr. Virgie [name]. Rik Wadel.  I, Lurline Del MD, have reviewed the above documentation for accuracy and completeness, and I agree with the above.   *Total Encounter Time as defined by the Centers for Medicare and Medicaid Services includes, in addition to the face-to-face time of a patient visit (documented in the note above) non-face-to-face time: obtaining and reviewing outside history, ordering and reviewing medications, tests or procedures, care coordination (communications with other health care professionals or caregivers) and documentation in the medical record.

## 2021-04-11 ENCOUNTER — Other Ambulatory Visit: Payer: Self-pay

## 2021-04-11 ENCOUNTER — Inpatient Hospital Stay: Payer: Medicare Other | Attending: Oncology

## 2021-04-11 ENCOUNTER — Inpatient Hospital Stay (HOSPITAL_BASED_OUTPATIENT_CLINIC_OR_DEPARTMENT_OTHER): Payer: Medicare Other | Admitting: Oncology

## 2021-04-11 ENCOUNTER — Other Ambulatory Visit: Payer: Self-pay | Admitting: Oncology

## 2021-04-11 VITALS — BP 147/87 | HR 92 | Temp 97.0°F | Resp 18 | Ht 65.0 in | Wt 151.2 lb

## 2021-04-11 DIAGNOSIS — Z171 Estrogen receptor negative status [ER-]: Secondary | ICD-10-CM

## 2021-04-11 DIAGNOSIS — C50919 Malignant neoplasm of unspecified site of unspecified female breast: Secondary | ICD-10-CM

## 2021-04-11 DIAGNOSIS — C50212 Malignant neoplasm of upper-inner quadrant of left female breast: Secondary | ICD-10-CM | POA: Insufficient documentation

## 2021-04-11 DIAGNOSIS — I10 Essential (primary) hypertension: Secondary | ICD-10-CM | POA: Diagnosis not present

## 2021-04-11 DIAGNOSIS — C50912 Malignant neoplasm of unspecified site of left female breast: Secondary | ICD-10-CM | POA: Diagnosis not present

## 2021-04-11 DIAGNOSIS — Z79811 Long term (current) use of aromatase inhibitors: Secondary | ICD-10-CM | POA: Insufficient documentation

## 2021-04-11 LAB — CBC WITH DIFFERENTIAL (CANCER CENTER ONLY)
Abs Immature Granulocytes: 0.01 10*3/uL (ref 0.00–0.07)
Basophils Absolute: 0 10*3/uL (ref 0.0–0.1)
Basophils Relative: 1 %
Eosinophils Absolute: 0 10*3/uL (ref 0.0–0.5)
Eosinophils Relative: 0 %
HCT: 47.2 % — ABNORMAL HIGH (ref 36.0–46.0)
Hemoglobin: 15.8 g/dL — ABNORMAL HIGH (ref 12.0–15.0)
Immature Granulocytes: 0 %
Lymphocytes Relative: 36 %
Lymphs Abs: 1.4 10*3/uL (ref 0.7–4.0)
MCH: 29 pg (ref 26.0–34.0)
MCHC: 33.5 g/dL (ref 30.0–36.0)
MCV: 86.8 fL (ref 80.0–100.0)
Monocytes Absolute: 0.4 10*3/uL (ref 0.1–1.0)
Monocytes Relative: 10 %
Neutro Abs: 2 10*3/uL (ref 1.7–7.7)
Neutrophils Relative %: 53 %
Platelet Count: 225 10*3/uL (ref 150–400)
RBC: 5.44 MIL/uL — ABNORMAL HIGH (ref 3.87–5.11)
RDW: 13.5 % (ref 11.5–15.5)
WBC Count: 3.8 10*3/uL — ABNORMAL LOW (ref 4.0–10.5)
nRBC: 0 % (ref 0.0–0.2)

## 2021-04-11 LAB — CMP (CANCER CENTER ONLY)
ALT: 12 U/L (ref 0–44)
AST: 14 U/L — ABNORMAL LOW (ref 15–41)
Albumin: 4 g/dL (ref 3.5–5.0)
Alkaline Phosphatase: 94 U/L (ref 38–126)
Anion gap: 10 (ref 5–15)
BUN: 19 mg/dL (ref 8–23)
CO2: 28 mmol/L (ref 22–32)
Calcium: 11.1 mg/dL — ABNORMAL HIGH (ref 8.9–10.3)
Chloride: 98 mmol/L (ref 98–111)
Creatinine: 0.85 mg/dL (ref 0.44–1.00)
GFR, Estimated: >60 mL/min (ref >60)
Glucose, Bld: 121 mg/dL — ABNORMAL HIGH (ref 70–99)
Potassium: 3.1 mmol/L — ABNORMAL LOW (ref 3.5–5.1)
Sodium: 136 mmol/L (ref 135–145)
Total Bilirubin: 0.5 mg/dL (ref 0.3–1.2)
Total Protein: 8 g/dL (ref 6.5–8.1)

## 2021-04-21 ENCOUNTER — Other Ambulatory Visit: Payer: Self-pay | Admitting: Oncology

## 2021-05-04 ENCOUNTER — Other Ambulatory Visit: Payer: Self-pay | Admitting: Oncology

## 2021-05-05 ENCOUNTER — Encounter: Payer: Self-pay | Admitting: Oncology

## 2021-06-06 ENCOUNTER — Other Ambulatory Visit: Payer: Self-pay

## 2021-06-06 ENCOUNTER — Inpatient Hospital Stay: Payer: Medicare Other | Attending: Oncology

## 2021-06-06 DIAGNOSIS — Z853 Personal history of malignant neoplasm of breast: Secondary | ICD-10-CM | POA: Diagnosis not present

## 2021-06-06 DIAGNOSIS — Z95828 Presence of other vascular implants and grafts: Secondary | ICD-10-CM

## 2021-06-06 DIAGNOSIS — C50212 Malignant neoplasm of upper-inner quadrant of left female breast: Secondary | ICD-10-CM

## 2021-06-06 MED ORDER — HEPARIN SOD (PORK) LOCK FLUSH 100 UNIT/ML IV SOLN
500.0000 [IU] | Freq: Once | INTRAVENOUS | Status: AC | PRN
  Administered 2021-06-06: 09:00:00 500 [IU]

## 2021-06-06 MED ORDER — SODIUM CHLORIDE 0.9% FLUSH
10.0000 mL | INTRAVENOUS | Status: DC | PRN
  Administered 2021-06-06: 09:00:00 10 mL

## 2021-06-23 ENCOUNTER — Telehealth: Payer: Self-pay | Admitting: *Deleted

## 2021-06-25 ENCOUNTER — Other Ambulatory Visit: Payer: Self-pay | Admitting: Oncology

## 2021-06-25 DIAGNOSIS — C50912 Malignant neoplasm of unspecified site of left female breast: Secondary | ICD-10-CM

## 2021-06-25 DIAGNOSIS — I10 Essential (primary) hypertension: Secondary | ICD-10-CM

## 2021-06-26 ENCOUNTER — Telehealth: Payer: Self-pay | Admitting: *Deleted

## 2021-06-26 ENCOUNTER — Other Ambulatory Visit: Payer: Self-pay | Admitting: Oncology

## 2021-06-26 DIAGNOSIS — C50912 Malignant neoplasm of unspecified site of left female breast: Secondary | ICD-10-CM

## 2021-06-26 DIAGNOSIS — I10 Essential (primary) hypertension: Secondary | ICD-10-CM

## 2021-06-26 NOTE — Telephone Encounter (Signed)
Former Armed forces logistics/support/administrative officer Dr. Chryl Heck in March. Gardiner Rhyme, RN

## 2021-07-03 ENCOUNTER — Ambulatory Visit: Payer: Medicare Other | Attending: Radiation Oncology

## 2021-07-03 ENCOUNTER — Other Ambulatory Visit: Payer: Self-pay

## 2021-07-03 VITALS — Wt 147.1 lb

## 2021-07-03 DIAGNOSIS — Z483 Aftercare following surgery for neoplasm: Secondary | ICD-10-CM | POA: Insufficient documentation

## 2021-07-03 NOTE — Patient Instructions (Signed)
Flexion (Eccentric) - Active-Assist (Cane)          Cancer Rehab [phone]    Use unaffected arm to push affected arm forward. Avoid hiking shoulder (shoulder should NOT touch cheek). Keep palm relaxed. Slowly lower affected arm. Hold stretch for _5_ seconds repeating _5-10_ times, _1-2_ times a day.  Abduction (Eccentric) - Active-Assist (Cane)    Use unaffected arm to push affected arm out to side. Avoid hiking shoulder (shoulder should NOT touch cheek). Keep palm relaxed. Slowly lower affected arm. Hold stretch _5_ seconds repeating _5-10_ times, _1-2_ times a day.

## 2021-07-03 NOTE — Therapy (Signed)
[facility] @ [place], [place], [address] Phone: [phone]   Fax:  [phone]  Physical Therapy Treatment  Patient Details  Name: Donna [name] MRN: [id number] Date of Birth: 1945-10-01 Referring Provider (PT): Dr. Toth/Dr. Isidore Moos   Encounter Date: 07/03/2021   PT End of Session - 07/03/21 0814     Visit Number 18   # unchanged due to screen only   PT Start Time 0808    PT Stop Time 0815    PT Time Calculation (min) 7 min    Activity Tolerance Patient tolerated treatment well    Behavior During Therapy [facility] for tasks assessed/performed             Past Medical History:  Diagnosis Date   Arthritis    oa   History of kidney stones    Hypertension    Personal history of chemotherapy    Personal history of radiation therapy     Past Surgical History:  Procedure Laterality Date   BREAST BIOPSY     CYSTOSCOPY  yrs ago   IR IMAGING GUIDED PORT INSERTION  02/16/2020   MASTECTOMY     MASTECTOMY W/ SENTINEL NODE BIOPSY Left 08/29/2020   Procedure: LEFT MASTECTOMY WITH SENTINEL LYMPH NODE BIOPSY;  Surgeon: [name], MD;  Location: [facility];  Service: General;  Laterality: Left;  RNFA, PEC BLOCK   TOTAL KNEE ARTHROPLASTY Left 06/05/2016   Procedure: LEFT TOTAL KNEE ARTHROPLASTY;  Surgeon: [name], MD;  [facility];  Service: Orthopedics;  Laterality: Left;    Vitals:   07/03/21 0810  Weight: 147 lb 2 oz (66.7 kg)     Subjective Assessment - 07/03/21 0810     Subjective Pt returns for her 3 month L-Dex screen. "I think I slept on my Lt shoulder wrong because it's really been hurting for about 2 weeks. I've lost some    Pertinent History Left mastectomy on 08/29/20  for Triple Negative Carcinoma with  neoadjuvant chemo 02/23/20, and adjuvant radiation.  She is presently staying in town with her children but is looking forward to going home to [place].                    L-DEX  FLOWSHEETS - 07/03/21 0800       L-DEX LYMPHEDEMA SCREENING   Measurement Type Unilateral    L-DEX MEASUREMENT EXTREMITY Upper Extremity    POSITION  Standing    DOMINANT SIDE Right    At Risk Side Left    BASELINE SCORE (UNILATERAL) -5.9    L-DEX SCORE (UNILATERAL) -0.2    VALUE CHANGE (UNILAT) 5.7                                     PT Long Term Goals - 01/03/21 1306       PT LONG TERM GOAL #1   Title Pt will be independent and compliant with a HEP for left shoulder ROM/strength    Baseline compliant but requires assist in PT to do correctly; pt doing better with this but does still require min VCs to decresae scapular compensations-01/03/21    Status Partially Met      PT LONG TERM GOAL #2   Title Pts PROM/AA ROM left shoulder improved to 130 deg  flexion    Baseline achieved today, but varies and only after stretching; AA/ROM flex  120 degrees - 01/03/21    Status Partially Met      PT LONG TERM GOAL #3   Title Pt able maintain  her arm in proper position for radiation    Baseline pt is able to maintain radiation positioning in a modified position by her putting her hand on her head-01/03/21    Status Achieved                   Plan - 07/03/21 0815     Clinical Impression Statement Pt returns for her 3 month L-Dex screen. Her change from baseline of 5.7 is WNLs so no further treatment is currently required at this time. However, she reports that she thinks she slept on her Lt shoulder wrong as it has been hurting with decrease motion for about 2 weeks. So briefly verbally reviewed with demonstration of AA/ROM with dowel or on table top and issued handout. Also instructed son who was present. She has a check up with one of her doctors in 2 weeks and so was encouraged to try gentle AA/ROM exs in the mean time and if she isn't some improved by then she could talk to her doctor about returning to physical therapy. Both pt and her son verbalized  good undersanding.    PT Next Visit Plan Cont every 3 month L-Dex screens for up to 2 years from her SLNB (~02/22/22)    Consulted and Agree with Plan of Care Patient;Family member/caregiver    Family Member Consulted son Donna [name]             Patient will benefit from skilled therapeutic intervention in order to improve the following deficits and impairments:     Visit Diagnosis: Aftercare following surgery for neoplasm     Problem List Patient Active Problem List   Diagnosis Date Noted   Cancer of left female breast ([place]) 08/29/2020   Port-A-Cath in place 03/22/2020   Malignant neoplasm of upper-inner quadrant of left breast in female, estrogen receptor negative ([place]) 02/04/2020   Metaplastic carcinoma of breast ([place]) 02/04/2020   Overweight (BMI 25.0-29.9) 06/06/2016   Hypokalemia 06/06/2016   S/P left TKA 06/05/2016    Otelia Limes, PTA 07/03/2021, 9:01 AM  [facility] @ [address] [place], [place], [address] Phone: [phone]   Fax:  [phone]  Name: Donna [name] MRN: [id number] Date of Birth: 10-26-1945

## 2021-07-19 ENCOUNTER — Other Ambulatory Visit: Payer: Self-pay | Admitting: *Deleted

## 2021-07-19 DIAGNOSIS — Z171 Estrogen receptor negative status [ER-]: Secondary | ICD-10-CM

## 2021-07-19 DIAGNOSIS — C50919 Malignant neoplasm of unspecified site of unspecified female breast: Secondary | ICD-10-CM

## 2021-07-19 DIAGNOSIS — I10 Essential (primary) hypertension: Secondary | ICD-10-CM

## 2021-07-19 DIAGNOSIS — C50912 Malignant neoplasm of unspecified site of left female breast: Secondary | ICD-10-CM

## 2021-07-19 MED ORDER — CHLORTHALIDONE 25 MG PO TABS: 25.0000 mg | ORAL_TABLET | Freq: Every day | ORAL | 0 refills | Status: DC

## 2021-07-19 MED ORDER — AMLODIPINE BESYLATE 10 MG PO TABS: 10.0000 mg | ORAL_TABLET | Freq: Every day | ORAL | 0 refills | Status: DC

## 2021-07-19 MED ORDER — LOSARTAN POTASSIUM 50 MG PO TABS: 50.0000 mg | ORAL_TABLET | Freq: Every day | ORAL | 0 refills | Status: DC

## 2021-07-20 ENCOUNTER — Ambulatory Visit
Admission: RE | Admit: 2021-07-20 | Discharge: 2021-07-20 | Disposition: A | Discharge status: Home / Self Care | Payer: Medicare Other | Source: Ambulatory Visit | Attending: Hematology and Oncology | Admitting: Hematology and Oncology

## 2021-07-20 ENCOUNTER — Other Ambulatory Visit: Payer: Medicare Other

## 2021-07-20 DIAGNOSIS — C50919 Malignant neoplasm of unspecified site of unspecified female breast: Secondary | ICD-10-CM

## 2021-07-20 DIAGNOSIS — Z171 Estrogen receptor negative status [ER-]: Secondary | ICD-10-CM

## 2021-07-20 DIAGNOSIS — C50212 Malignant neoplasm of upper-inner quadrant of left female breast: Secondary | ICD-10-CM

## 2021-08-08 IMAGING — MR MR BREAST BILAT WO/W CM
8 of 13 series · 32 of 48 positions shown · IV contrast (7ml gadavist)
Comparison: Bilateral breast MRI dated 02/10/2020

CLINICAL DATA: Follow-up metaplastic carcinoma (carcinosarcoma) in
the upper inner quadrant of the left breast, diagnosed elsewhere and
treated in the interim with neoadjuvant chemotherapy.

LABS:  None obtained on site today.
EXAM:
BILATERAL BREAST MRI WITH AND WITHOUT CONTRAST
TECHNIQUE: Multiplanar, multisequence MR images of both breasts were obtained
prior to and following the intravenous administration of 7 ml of
Gadavist

[Series 2: t2_tirm_tra ipat (a-p) · axial · 3.0mm · 0.70mm/px · 1 of 65 slices shown]
[im 1/65]
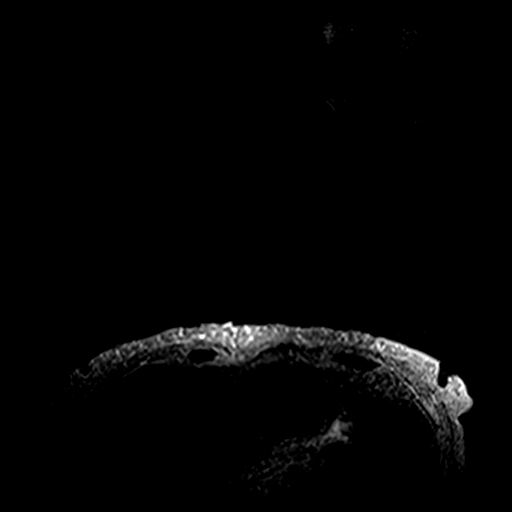

[Series 3: fl3d pre-cm no · axial · non-contrast · 1.2mm · 0.94mm/px · z∈[-76,+114]mm · 5 of 160 slices shown]
[im 1/160]
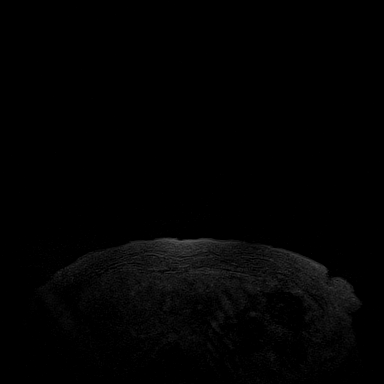
[im 40/160]
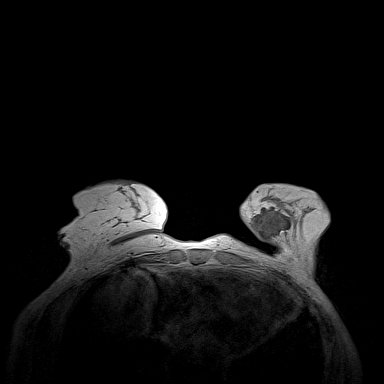
[im 80/160]
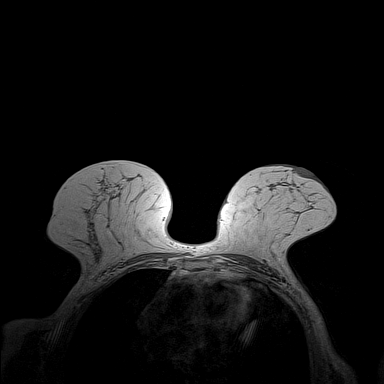
[im 120/160]
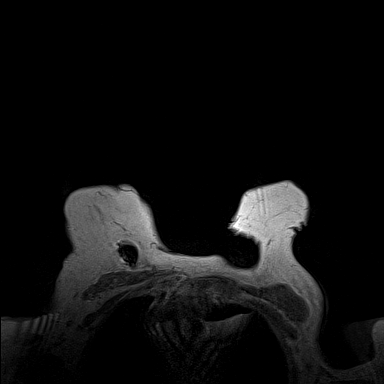
[im 160/160]
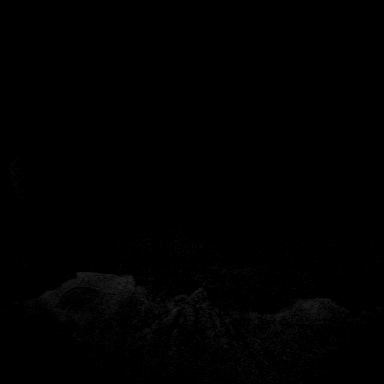

[Series 4: fl3d pre-cm · axial · non-contrast · 1.2mm · 0.94mm/px · z∈[-76,+114]mm · 5 of 160 slices shown]
[im 1/160]
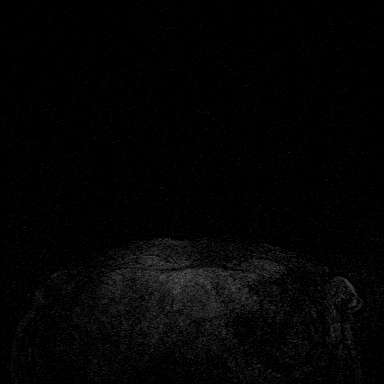
[im 40/160]
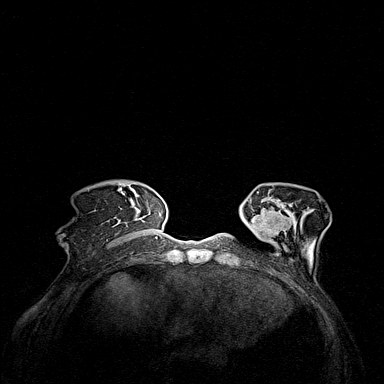
[im 80/160]
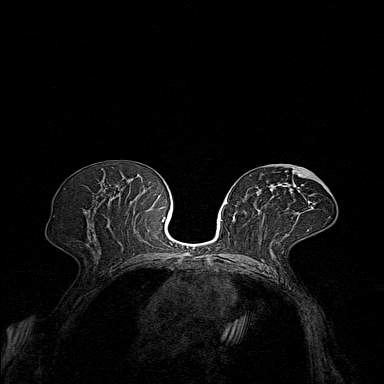
[im 120/160]
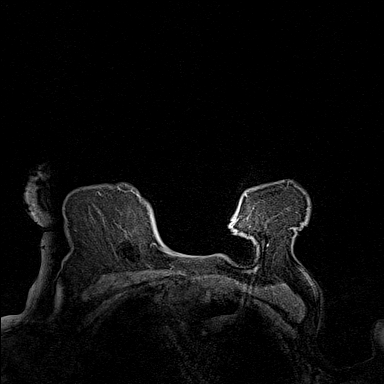
[im 160/160]
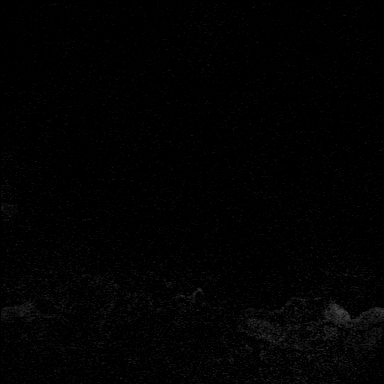

[Series 5: fl3d post-cm 20 · axial · 1.2mm · 0.94mm/px · z∈[-76,+114]mm · 5 of 160 slices shown (1 of 3)]
[im 1/160]
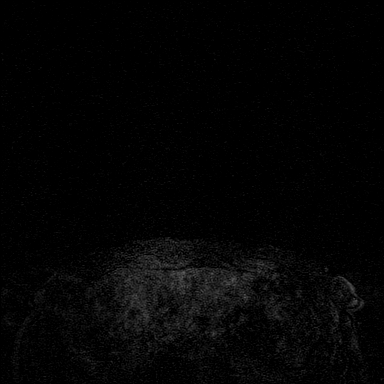
[im 40/160]
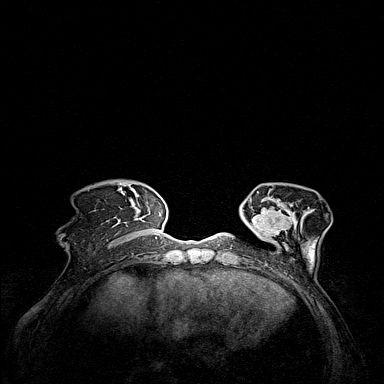
[im 80/160]
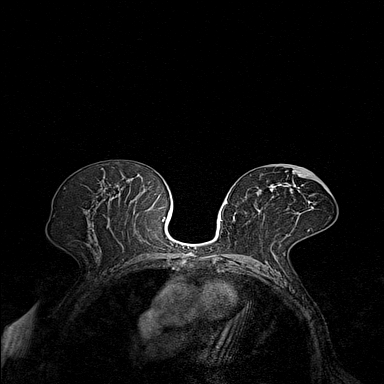
[im 120/160]
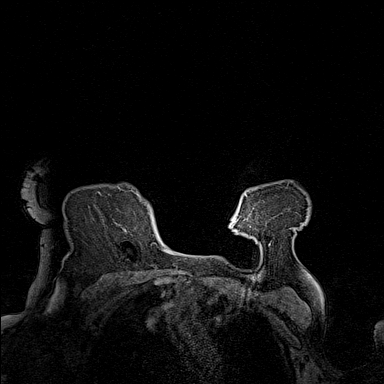
[im 160/160]
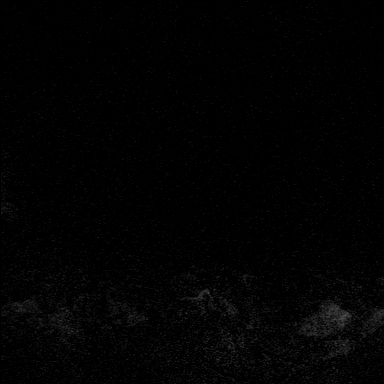

[Series 6: fl3d post-cm 20 · axial · 1.2mm · 0.94mm/px · z∈[-76,+114]mm · 5 of 160 slices shown (2 of 3)]
[im 1/160]
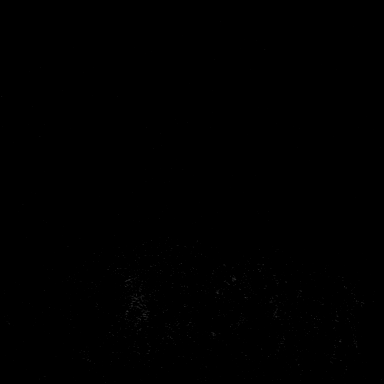
[im 40/160]
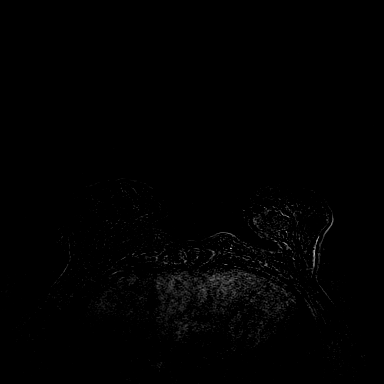
[im 80/160]
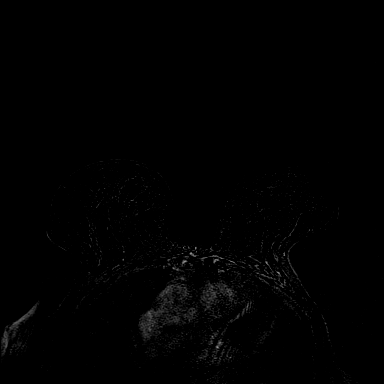
[im 120/160]
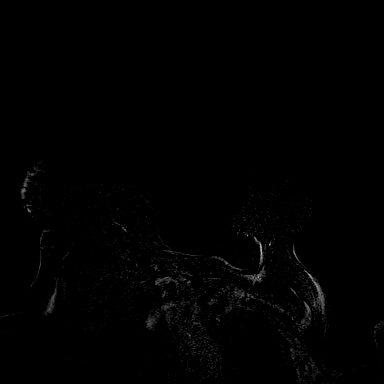
[im 160/160]
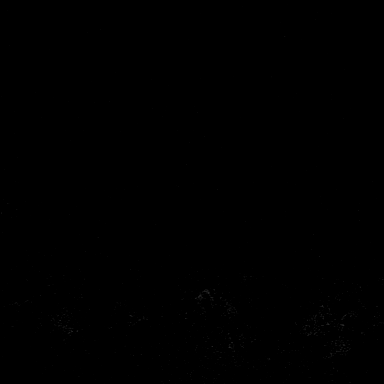

[Series 7: fl3d post-cm 20 · axial · 192.0mm · 0.94mm/px · 1 of 1 slices shown (3 of 3)]
[im 1/1]
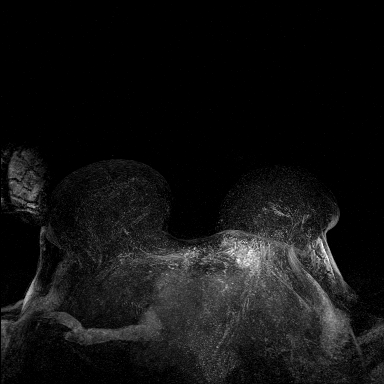

[Series 8: fl3d post-cm 3min · axial · 1.2mm · 0.94mm/px · z∈[-76,+114]mm · 5 of 160 slices shown]
[im 1/160]
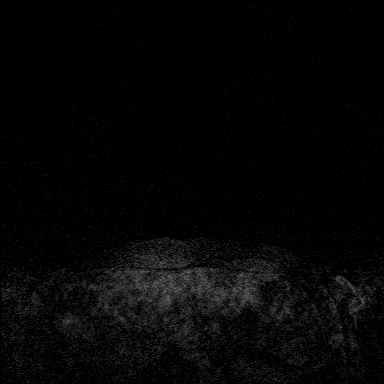
[im 40/160]
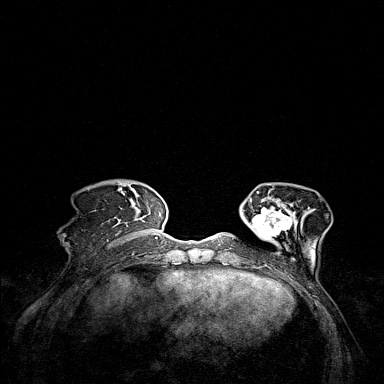
[im 80/160]
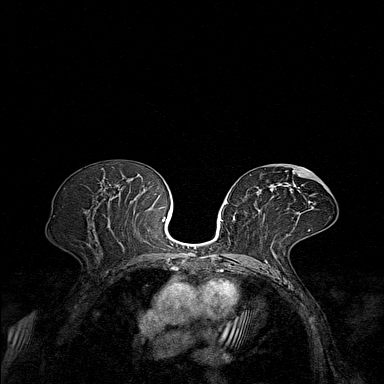
[im 120/160]
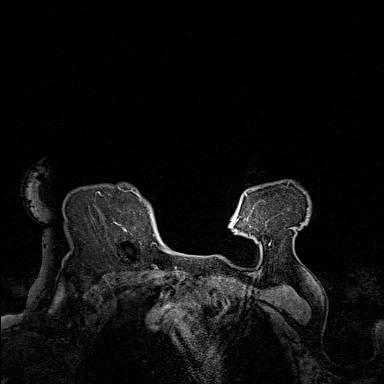
[im 160/160]
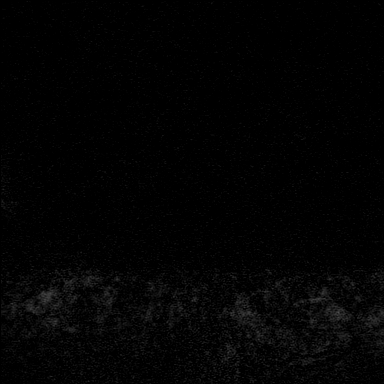

[Series 9: fl3d post-cm 3min_sub · axial · 1.2mm · 0.94mm/px · z∈[-76,+76]mm · 5 of 160 slices shown]
[im 1/160]
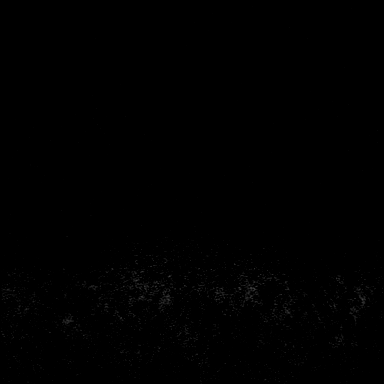
[im 32/160]
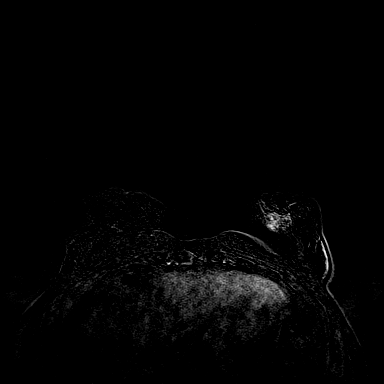
[im 64/160]
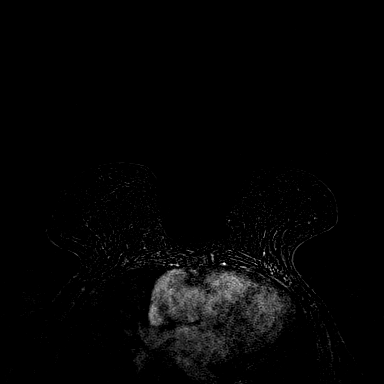
[im 96/160]
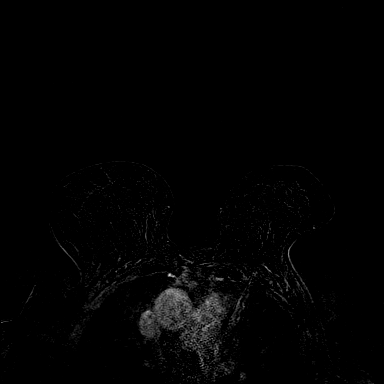
[im 128/160]
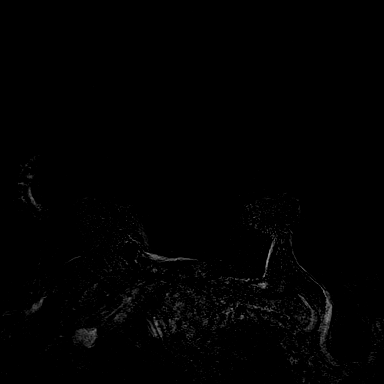

[32 of 48 positions shown; findings below may reference images not displayed]

Three-dimensional MR images were rendered by post-processing of the
original MR data on an independent workstation. The
three-dimensional MR images were interpreted, and findings are
reported in the following complete MRI report for this study. Three
dimensional images were evaluated at the independent interpreting
workstation using the DynaCAD thin client.
FINDINGS: Breast composition: b. Scattered fibroglandular tissue.

Background parenchymal enhancement: Moderate.

Right breast: Chemotherapy port in the posterior aspect of the upper
inner quadrant of the breast. No mass or abnormal enhancement.

Left breast: The previously demonstrated 4.4 x 4.0 x 3.9 cm
irregular, heterogeneous, enhancing mass in the lower inner quadrant
of the breast is again demonstrated. This currently measures 3.5 cm
in width on image number 122 series August 11, 10 cm in AP dimension in
the sagittal plane and 2.8 cm in length in the sagittal plane. This
has persistent enhancement kinetics.

Lymph nodes: No abnormal appearing lymph nodes.

Ancillary findings:  None.
IMPRESSION: 1. Interval decrease in size of the biopsy-proven malignancy in the
lower inner quadrant of the left breast. This currently measures
x 3.2 x 2.8 cm, previously 4.4 x 4.0 x 3.9 cm.
2. No evidence of malignancy elsewhere in either breast and no
adenopathy.

RECOMMENDATION:
Treatment plan.

BI-RADS CATEGORY  6: Known biopsy-proven malignancy.

## 2021-08-09 ENCOUNTER — Other Ambulatory Visit: Payer: Self-pay

## 2021-08-09 DIAGNOSIS — Z171 Estrogen receptor negative status [ER-]: Secondary | ICD-10-CM

## 2021-08-09 DIAGNOSIS — C50212 Malignant neoplasm of upper-inner quadrant of left female breast: Secondary | ICD-10-CM

## 2021-08-10 ENCOUNTER — Inpatient Hospital Stay: Payer: Medicare Other | Attending: Oncology

## 2021-08-10 ENCOUNTER — Inpatient Hospital Stay (HOSPITAL_BASED_OUTPATIENT_CLINIC_OR_DEPARTMENT_OTHER): Payer: Medicare Other | Admitting: *Deleted

## 2021-08-10 ENCOUNTER — Inpatient Hospital Stay (HOSPITAL_BASED_OUTPATIENT_CLINIC_OR_DEPARTMENT_OTHER): Payer: Medicare Other | Admitting: Hematology and Oncology

## 2021-08-10 ENCOUNTER — Encounter: Payer: Self-pay | Admitting: *Deleted

## 2021-08-10 ENCOUNTER — Encounter: Payer: Self-pay | Admitting: Hematology and Oncology

## 2021-08-10 ENCOUNTER — Other Ambulatory Visit: Payer: Self-pay

## 2021-08-10 VITALS — BP 157/90 | HR 95 | Temp 97.5°F | Resp 16

## 2021-08-10 VITALS — BP 157/90 | HR 95 | Temp 97.5°F | Resp 16 | Ht 65.0 in | Wt 150.2 lb

## 2021-08-10 DIAGNOSIS — Z923 Personal history of irradiation: Secondary | ICD-10-CM | POA: Insufficient documentation

## 2021-08-10 DIAGNOSIS — M810 Age-related osteoporosis without current pathological fracture: Secondary | ICD-10-CM | POA: Insufficient documentation

## 2021-08-10 DIAGNOSIS — Z853 Personal history of malignant neoplasm of breast: Secondary | ICD-10-CM | POA: Diagnosis not present

## 2021-08-10 DIAGNOSIS — Z95828 Presence of other vascular implants and grafts: Secondary | ICD-10-CM

## 2021-08-10 DIAGNOSIS — I10 Essential (primary) hypertension: Secondary | ICD-10-CM | POA: Insufficient documentation

## 2021-08-10 DIAGNOSIS — C50919 Malignant neoplasm of unspecified site of unspecified female breast: Secondary | ICD-10-CM

## 2021-08-10 DIAGNOSIS — C50212 Malignant neoplasm of upper-inner quadrant of left female breast: Secondary | ICD-10-CM

## 2021-08-10 DIAGNOSIS — C50912 Malignant neoplasm of unspecified site of left female breast: Secondary | ICD-10-CM | POA: Diagnosis not present

## 2021-08-10 DIAGNOSIS — Z171 Estrogen receptor negative status [ER-]: Secondary | ICD-10-CM

## 2021-08-10 LAB — CBC WITH DIFFERENTIAL (CANCER CENTER ONLY)
Abs Immature Granulocytes: 0.03 10*3/uL (ref 0.00–0.07)
Basophils Absolute: 0 10*3/uL (ref 0.0–0.1)
Basophils Relative: 0 %
Eosinophils Absolute: 0.2 10*3/uL (ref 0.0–0.5)
Eosinophils Relative: 3 %
HCT: 42.1 % (ref 36.0–46.0)
Hemoglobin: 13.7 g/dL (ref 12.0–15.0)
Immature Granulocytes: 1 %
Lymphocytes Relative: 26 %
Lymphs Abs: 1.5 10*3/uL (ref 0.7–4.0)
MCH: 27.7 pg (ref 26.0–34.0)
MCHC: 32.5 g/dL (ref 30.0–36.0)
MCV: 85.2 fL (ref 80.0–100.0)
Monocytes Absolute: 0.3 10*3/uL (ref 0.1–1.0)
Monocytes Relative: 6 %
Neutro Abs: 3.8 10*3/uL (ref 1.7–7.7)
Neutrophils Relative %: 64 %
Platelet Count: 289 10*3/uL (ref 150–400)
RBC: 4.94 MIL/uL (ref 3.87–5.11)
RDW: 14 % (ref 11.5–15.5)
WBC Count: 5.8 10*3/uL (ref 4.0–10.5)
nRBC: 0 % (ref 0.0–0.2)

## 2021-08-10 LAB — CMP (CANCER CENTER ONLY)
ALT: 8 U/L (ref 0–44)
AST: 11 U/L — ABNORMAL LOW (ref 15–41)
Albumin: 3.8 g/dL (ref 3.5–5.0)
Alkaline Phosphatase: 83 U/L (ref 38–126)
Anion gap: 7 (ref 5–15)
BUN: 13 mg/dL (ref 8–23)
CO2: 27 mmol/L (ref 22–32)
Calcium: 11.3 mg/dL — ABNORMAL HIGH (ref 8.9–10.3)
Chloride: 103 mmol/L (ref 98–111)
Creatinine: 0.86 mg/dL (ref 0.44–1.00)
GFR, Estimated: >60 mL/min (ref >60)
Glucose, Bld: 110 mg/dL — ABNORMAL HIGH (ref 70–99)
Potassium: 3.1 mmol/L — ABNORMAL LOW (ref 3.5–5.1)
Sodium: 137 mmol/L (ref 135–145)
Total Bilirubin: 0.3 mg/dL (ref 0.3–1.2)
Total Protein: 8 g/dL (ref 6.5–8.1)

## 2021-08-10 MED ORDER — TAMOXIFEN CITRATE 20 MG PO TABS: 20.0000 mg | ORAL_TABLET | Freq: Every day | ORAL | 2 refills | Status: DC

## 2021-08-10 MED ORDER — SODIUM CHLORIDE 0.9% FLUSH
10.0000 mL | INTRAVENOUS | Status: DC | PRN
  Administered 2021-08-10: 10 mL

## 2021-08-10 MED ORDER — HEPARIN SOD (PORK) LOCK FLUSH 100 UNIT/ML IV SOLN
500.0000 [IU] | Freq: Once | INTRAVENOUS | Status: AC | PRN
  Administered 2021-08-10: 500 [IU]

## 2021-08-10 NOTE — Progress Notes (Signed)
Survivorship Care Plan Visit reviewed and completed. ?

## 2021-08-10 NOTE — Progress Notes (Signed)
[place]  [phone] [phone]     ID: Donna Donna [name] DOB: [birth year]  MR#: [id number]  SRP#:[id number]  Patient Care Team: Donna Morita, PA-C as PCP - General (Hematology and Oncology) Donna Kussmaul, MD as Consulting Physician (General Surgery) Donna Cancel, MD as Consulting Physician (Orthopedic Surgery) Donna Kaufmann, RN as Oncology Nurse Navigator Donna Germany, RN as Oncology Nurse Navigator Donna Pike, MD as Consulting Physician (Hematology and Oncology) Donna Donna [name], Donna Massed, NP as Nurse Practitioner (Hematology and Oncology) Donna Pier, RN as Registered Nurse Donna Gibson, MD as Attending Physician (Radiation Oncology) Donna Pike, MD OTHER MD: Donna Lean MD ([place]); Donna Bolster NP  CHIEF COMPLAINT: 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 She started anastrozole on 02/14/2021.  She states the pills are making her very achy and she has tingling and numbness in her hands and her feet which is likely secondary to the neuropathy from chemotherapy.  She was wondering if she can take a different pill.  She otherwise has not noticed any changes in her breast.  No change in breathing, bowel habits or urinary habits.  No new neurological complaints. Since her last visit, she underwent right screening mammography with tomography at [facility] on 03/16/2021 showing: breast density category B; no evidence of malignancy. Bone density showed osteoporosis.  T score of -Donna [name].4.   REVIEW OF SYSTEMS:.  She is normally active for her age she says.  A detailed review of systems today was otherwise noncontributory   COVID 19 VACCINATION STATUS: s/p Moderna x2   HISTORY OF CURRENT ILLNESS: From the original intake note:  Donna [name] herself  palpated a mass in her left breast during showering.  She brought this to medical attention and on 12/16/2019 underwent mammography, the results of which I do not have today.  She proceeded to left breast ultrasound showing breast density B.  There was a mass in the upper inner quadrant of the left breast at the 10 o'clock position 7 cm from the nipple.  It measured Donna [name].26 cm.  There are no ultrasound comments regarding the axilla.  Biopsy of the mass in question was performed 01/15/2020 at [facility] and the pathology report describes a biphasic tumor with malignant epithelial component and a mesenchymal component.  The epithelial component appears to squamoid and stain strongly for CK 8/18 by immunohistochemistry.  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  The tumor was essentially triple negative, estrogen receptor 0%, progesterone receptor 1% with 1+ intensity, HER-2 1+ by immunohistochemistry, Ki-67 12%.  (The 1% progesterone receptor positivity is best read as an artifact as the progesterone receptor does not function in the absence of a functional estrogen receptor).  The patient's subsequent history is as detailed below   PAST MEDICAL HISTORY: Past Medical History:  Diagnosis Date   Arthritis    oa   History of kidney stones    Hypertension    Personal history of chemotherapy    Personal history of radiation therapy     PAST SURGICAL HISTORY: Past Surgical History:  Procedure Laterality Date   BREAST BIOPSY     CYSTOSCOPY  yrs ago   IR IMAGING GUIDED PORT INSERTION  02/16/2020   MASTECTOMY     MASTECTOMY W/ SENTINEL  NODE BIOPSY Left 08/29/2020   Procedure: LEFT MASTECTOMY WITH SENTINEL LYMPH NODE BIOPSY;  Surgeon: Donna Kussmaul, MD;  Location: [place];  Service: General;  Laterality: Left;  RNFA, PEC BLOCK   TOTAL KNEE ARTHROPLASTY Left 06/05/2016   Procedure: LEFT TOTAL KNEE  ARTHROPLASTY;  Surgeon: Donna Cancel, MD;  Location: [facility];  Service: Orthopedics;  Laterality: Left;    FAMILY HISTORY No family history on file.  The patient has no information on her father.  Her mother died at the age of 88.  She had 1 sister and 2 brothers.  No one on that side of the family is known to have had cancer.  The patient herself had 5/2 siblings, 2 sisters and Donna [name] brothers, none with a history of cancer.   GYNECOLOGIC HISTORY:  No LMP recorded (lmp unknown). Patient is postmenopausal. Menarche: 76 years old Age at first live birth: 76 years old [place] P 2 LMP mid 15s Contraceptive no HRT no  Hysterectomy?  No Salpingo-oophorectomy?  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  She is divorced and her former husband subsequently died.  She lives by herself with no pets.  Her son Donna Donna [name] is a Production manager and travels a great deal as a Optometrist.  Daughter Donna Donna [name] teaches middle school in [place].  The patient has 4 grandchildren.  She is a Psychologist, forensic.    ADVANCED DIRECTIVES: Not in place   HEALTH MAINTENANCE: Social History   Tobacco Use   Smoking status: Never   Smokeless tobacco: Never  Vaping Use   Vaping Use: Never used  Substance Use Topics   Alcohol use: No   Drug use: No     Colonoscopy: Never  PAP: Remote  Bone density: Remote   Allergies  Allergen Reactions   Benazepril Swelling    Patient had severe angioedema on 10/05/2015.    Current Outpatient Medications  Medication Sig Dispense Refill   tamoxifen (NOLVADEX) 20 MG tablet Take 1 tablet (20 mg total) by mouth daily. 30 tablet 2   amLODipine (NORVASC) 10 MG tablet Take 1 tablet (10 mg total) by mouth daily after lunch. 90 tablet 0   anastrozole (ARIMIDEX) 1 MG tablet TAKE 1 TABLET (1 MG TOTAL) BY MOUTH DAILY. START February 14, 2021 90 tablet 1   chlorthalidone (HYGROTON) 25 MG tablet Take 1 tablet (25 mg total) by mouth daily. 90  tablet 0   losartan (COZAAR) 50 MG tablet Take 1 tablet (50 mg total) by mouth daily. 90 tablet 0   Multiple Vitamins-Minerals (CENTRUM ADULTS) TABS Take 1 tablet by mouth daily.     potassium chloride (MICRO-K) 10 MEQ CR capsule Take 10 mEq by mouth 2 (two) times daily.     No current facility-administered medications for this visit.    OBJECTIVE: African-American woman who appears stated age 76:   08/10/21 0841  BP: (!) 157/90  Pulse: 95  Resp: 16  Temp: (!) 97.5 F (36.4 C)  SpO2: 99%      Body mass index is 24.99 kg/m.   Wt Readings from Last Donna [name] Encounters:  08/10/21 150 lb Donna [name].2 oz (68.1 kg)  07/03/21 147 lb 2 oz (66.7 kg)  04/11/21 151 lb Donna [name].2 oz (68.6 kg)      ECOG FS:1 - Symptomatic but completely ambulatory  Physical Exam Constitutional:      Appearance: Normal appearance.  Cardiovascular:     Rate and Rhythm: Normal rate  and regular rhythm.     Pulses: Normal pulses.     Heart sounds: Normal heart sounds.  Pulmonary:     Effort: Pulmonary effort is normal.     Breath sounds: Normal breath sounds.  Chest:     Comments: Right breast with no palpable masses.  No regional adenopathy.  Left breast status postmastectomy with no concerns for recurrence. Abdominal:     General: Abdomen is flat. Bowel sounds are normal.  Musculoskeletal:        General: No swelling or tenderness.     Cervical back: Normal range of motion and neck supple. No rigidity.  Lymphadenopathy:     Cervical: No cervical adenopathy.  Skin:    General: Skin is warm and dry.  Neurological:     General: No focal deficit present.     Mental Status: She is alert.     LAB RESULTS:  CMP     Component Value Date/Time   NA 137 08/10/2021 0833   K Donna [name].1 (L) 08/10/2021 0833   CL 103 08/10/2021 0833   CO2 27 08/10/2021 0833   GLUCOSE 110 (H) 08/10/2021 0833   BUN 13 08/10/2021 0833   CREATININE 0.86 08/10/2021 0833   CALCIUM 11.Donna [name] (H) 08/10/2021 0833   PROT 8.0 08/10/2021 0833   ALBUMIN  Donna [name].8 08/10/2021 0833   AST 11 (L) 08/10/2021 0833   ALT 8 08/10/2021 0833   ALKPHOS 83 08/10/2021 0833   BILITOT 0.Donna [name] 08/10/2021 0833   GFRNONAA >60 08/10/2021 0833   GFRAA >60 03/08/2020 0906    No results found for: Ronnald Ramp, A1GS, A2GS, BETS, BETA2SER, GAMS, MSPIKE, SPEI  No results found for: Nils Pyle, [facility]  Lab Results  Component Value Date   WBC 5.8 08/10/2021   NEUTROABS Donna [name].8 08/10/2021   HGB 13.7 08/10/2021   HCT 42.1 08/10/2021   MCV 85.2 08/10/2021   PLT 289 08/10/2021      Chemistry      Component Value Date/Time   NA 137 08/10/2021 0833   K Donna [name].1 (L) 08/10/2021 0833   CL 103 08/10/2021 0833   CO2 27 08/10/2021 0833   BUN 13 08/10/2021 0833   CREATININE 0.86 08/10/2021 0833      Component Value Date/Time   CALCIUM 11.Donna [name] (H) 08/10/2021 0833   ALKPHOS 83 08/10/2021 0833   AST 11 (L) 08/10/2021 0833   ALT 8 08/10/2021 0833   BILITOT 0.Donna [name] 08/10/2021 0833       No results found for: LABCA2  No components found for: OVZCHY850  No results for input(s): INR in the last 168 hours.  No results found for: LABCA2  No results found for: YDX412  No results found for: INO676  No results found for: HMC947  No results found for: CA2729  No components found for: HGQUANT  No results found for: CEA1 / No results found for: CEA1   No results found for: AFPTUMOR  No results found for: CHROMOGRNA  No results found for: HGBA, HGBA2QUANT, HGBFQUANT, HGBSQUAN (Hemoglobinopathy evaluation)   No results found for: LDH  No results found for: IRON, TIBC, IRONPCTSAT (Iron and TIBC)  No results found for: FERRITIN  Urinalysis    Component Value Date/Time   COLORURINE YELLOW 05/24/2020 0950   APPEARANCEUR HAZY (A) 05/24/2020 0950   LABSPEC 1.015 05/24/2020 0950   PHURINE 6.0 05/24/2020 [place] 05/24/2020 0950   HGBUR NEGATIVE 05/24/2020 [place] 05/24/2020 [place]  05/24/2020 0950  PROTEINUR 100 (A) 05/24/2020 0950   NITRITE NEGATIVE 05/24/2020 0950   LEUKOCYTESUR SMALL (A) 05/24/2020 0950    STUDIES: DG Bone Density  Result Date: 07/20/2021 EXAM: DUAL X-RAY ABSORPTIOMETRY (DXA) FOR BONE MINERAL DENSITY IMPRESSION: Referring Physician:  Benay [name] Your patient completed a bone mineral density test using GE Lunar iDXA system (analysis version: 16). Technologist: [place] PATIENT: Name: Donna Donna [name], Donna Donna [name] Patient ID: [id number] Birth Date: 24-May-1946 Height: 65.0 in. Sex: Female Measured: 07/20/2021 Weight: 147.1 lbs. Indications: Advanced Age, Anastrazole, Breast Cancer History, Estrogen Deficient, Postmenopausal Fractures: Treatments: ASSESSMENT: The BMD measured at Femur Total Right is 0.574 g/cm2 with a T-score of -Donna [name].4. This patient is considered osteoporotic according to [facility]) criteria. The quality of the exam is good. The lumbar spine was excluded due to degenerative changes. Site Region Measured Date Measured Age YA BMD Significant CHANGE T-score DualFemur Total Right 07/20/2021 73.9 -Donna [name].6 0.574 g/cm2 DualFemur Total Mean 07/20/2021 73.9 -Donna [name].Donna [name] 0.590 g/cm2 Left Forearm Radius 33% 07/20/2021 73.9 -2.6 0.652 g/cm2 World Health Organization [facility]) criteria for post-menopausal, Caucasian Women: Normal       T-score at or above -1 SD Osteopenia   T-score between -1 and -2.5 SD Osteoporosis T-score at or below -2.5 SD RECOMMENDATION: 1. All patients should optimize calcium and vitamin D intake. 2. Consider FDA-approved medical therapies in postmenopausal women and men aged 21 years and older, based on the following: a. A hip or vertebral (clinical or morphometric) fracture. b. T-score = -2.5 at the femoral neck or spine after appropriate evaluation to exclude secondary causes. c. Low bone mass (T-score between -1.0 and -2.5 at the femoral neck or spine) and a 10-year probability of a hip fracture = Donna [name]% or a 10-year probability of a major  osteoporosis-related fracture = 20% based on the [place]-adapted WHO algorithm. d. Clinician judgment and/or patient preferences may indicate treatment for people with 10-year fracture probabilities above or below these levels. FOLLOW-UP: Patients with diagnosis of osteoporosis or at high risk for fracture should have regular bone mineral density tests.? Patients eligible for Medicare are allowed routine testing every 2 years.? The testing frequency can be increased to one year for patients who have rapidly progressing disease, are receiving or discontinuing medical therapy to restore bone mass, or have additional risk factors. I have reviewed this study and agree with the findings. [facility] Electronically Signed   By: Elmer Picker M.D.   On: 07/20/2021 08:29     ELIGIBLE FOR AVAILABLE RESEARCH PROTOCOL: no  ASSESSMENT: 76 y.o. [place] woman status post left breast upper inner quadrant biopsy 01/15/2020 for a clinical T2 NX, stage IIB anaplastic carcinoma, triple negative, with an MIB-1 of 12%  (a) bone scan and CT chest 02/12/2020 show no evidence of metastatic disease  (1) neoadjuvant chemotherapy with doxorubicin and cyclophosphamide in dose dense fashion x4 started 02/23/2020, followed by weekly paclitaxel and carboplatin x12 starting 04/19/2020  (a) echo 02/12/2020 shows an ejection fraction in the 60-65% range  (2) left mastectomy and sentinel lymph node sampling 08/29/2020 showed a residual ypT2 ypN0 metaplastic carcinoma, grade Donna [name], with negative margins.  (a) a total of 2 left axillary lymph nodes were removed  (b) repeat prognostic panel shows the tumor to be estrogen receptor negative but progesterone receptor positive at 5% with strong staining intensity.  HER2 was equivocal by immunotherapy but negative by FISH  (Donna [name]) adjuvant radiation 12/01/2020 - 01/13/2021  (4) started anastrozole 02/14/2021  (5) consider [place] if disease  recurrence  PLAN:  Parisha is now a little over half year out from definitive surgery for her breast cancer.  There is no evidence of disease recurrence. She is receiving anastrozole because of her progesterone receptor positivity and the final pathology.   She was wondering if she can take a different pill because of increased achiness.  She also notices tingling and numbness in her hands and her feet which is likely secondary to the chemotherapy she received. She recently had a bone density scan which showed osteoporosis.  She is not quite sure yet if she wants the port removed, she does not want to go through another surgery. Physical examination today with no concern for recurrence. Last mammogram in October 2022 negative for any malignancy. Given her increased arthralgias and because of severe osteoporosis, I have discussed about switching her to tamoxifen.  I discussed mechanism of action of tamoxifen, adverse effects of tamoxifen including but not limited to postmenopausal symptoms, DVT/PE risk, endometrial cancer and questionable increased risk of cardiovascular events.  Benefit from tamoxifen would be improvement in bone density.  She has an impending dental evaluation and is not sure if she needs any dental surgeries.  Hence I have requested her to start taking some vitamin D supplementation, proceed with some weightbearing exercises and take calcium as tolerated.  Once she is done with all her dental evaluation, she can let us know and we can start her on bisphosphonates.  She and her son expressed understanding of all the recommendations. Return to clinic in Donna [name] months. Total encounter time 30 minutes.Donna Pike, MD   Donna [name]/07/2021 10:14 AM Medical Oncology and Hematology [facility] [place], [place] [address] Tel. [phone]    Fax. [phone]   *Total Encounter Time as defined by the Centers for Medicare and Medicaid Services includes, in  addition to the face-to-face time of a patient visit (documented in the note above) non-face-to-face time: obtaining and reviewing outside history, ordering and reviewing medications, tests or procedures, care coordination (communications with other health care professionals or caregivers) and documentation in the medical record.

## 2021-08-11 ENCOUNTER — Telehealth: Payer: Self-pay | Admitting: *Deleted

## 2021-08-11 ENCOUNTER — Encounter: Payer: Self-pay | Admitting: Oncology

## 2021-08-11 NOTE — Telephone Encounter (Addendum)
This RN contacted pt and spoke with her daughter- she was not in the pt's home presently. She states her mother is not taking a calcium supplement but only a multivitamin. She is unsure if there is calcium in it. She will check when she goes to her mom's. Reviewed labs and need to not take the multiple vitamin if it contains calcium. ? ?----- Message from Benay Pike, MD sent at 08/11/2021  2:52 PM EST ----- ?Her calcium is always high, can you check if she is taking any calcium supplement.  ?

## 2021-10-02 ENCOUNTER — Ambulatory Visit: Payer: Medicare Other | Attending: Radiation Oncology

## 2021-10-02 VITALS — Wt 149.4 lb

## 2021-10-02 DIAGNOSIS — Z483 Aftercare following surgery for neoplasm: Secondary | ICD-10-CM | POA: Insufficient documentation

## 2021-10-02 NOTE — Therapy (Signed)
?  OUTPATIENT PHYSICAL THERAPY SOZO SCREENING NOTE ? ? ?Patient Name: Donna [name] ?MRN: [id number] ?DOB:[birth year], 76 y.o., female ?Today's Date: 10/02/2021 ? ?PCP: Montez Morita, PA-C ?REFERRING PROVIDER: Montez Morita, PA-C ? ? PT End of Session - 10/02/21 0820   ? ? Visit Number 18   # unchanged due to screen only  ? PT Start Time [phone]   ? PT Stop Time 0825   ? PT Time Calculation (min) 6 min   ? Activity Tolerance Patient tolerated treatment well   ? Behavior During Therapy [facility] for tasks assessed/performed   ? ?  ?  ? ?  ? ? ?Past Medical History:  ?Diagnosis Date  ? Arthritis   ? oa  ? History of kidney stones   ? Hypertension   ? Personal history of chemotherapy   ? Personal history of radiation therapy   ? ?Past Surgical History:  ?Procedure Laterality Date  ? BREAST BIOPSY    ? CYSTOSCOPY  yrs ago  ? IR IMAGING GUIDED PORT INSERTION  02/16/2020  ? MASTECTOMY    ? MASTECTOMY W/ SENTINEL NODE BIOPSY Left 08/29/2020  ? Procedure: LEFT MASTECTOMY WITH SENTINEL LYMPH NODE BIOPSY;  Surgeon: [name], MD;  Location: [place];  Service: General;  Laterality: Left;  RNFA, PEC BLOCK  ? TOTAL KNEE ARTHROPLASTY Left 06/05/2016  ? Procedure: LEFT TOTAL KNEE ARTHROPLASTY;  Surgeon: [name], MD;  Location[facility];  Service: Orthopedics;  Laterality: Left;  ? ?Patient Active Problem List  ? Diagnosis Date Noted  ? Cancer of left female breast ([place]) 08/29/2020  ? Port-A-Cath in place 03/22/2020  ? Malignant neoplasm of upper-inner quadrant of left breast in female, estrogen receptor negative ([place]) 02/04/2020  ? Metaplastic carcinoma of breast ([place]) 02/04/2020  ? Overweight (BMI 25.0-29.9) 06/06/2016  ? Hypokalemia 06/06/2016  ? S/P left TKA 06/05/2016  ? ? ?REFERRING DIAG: left breast cancer at risk for lymphedema ? ?THERAPY DIAG:  ?Aftercare following surgery for neoplasm ? ?PERTINENT HISTORY: Left mastectomy on 08/29/20  for Triple Negative Carcinoma with  neoadjuvant chemo 02/23/20, and adjuvant radiation.   She is presently staying in town with her children but is looking forward to going home to [place].  ? ?PRECAUTIONS: left UE Lymphedema risk, None ? ?SUBJECTIVE: Pt returns for her 3 month L-Dex screen.  ? ?PAIN:  ?Are you having pain? No ? ?SOZO SCREENING: ?Patient was assessed today using the SOZO machine to determine the lymphedema index score. This was compared to her baseline score. It was determined that she is within the recommended range when compared to her baseline and no further action is needed at this time. She will continue SOZO screenings. These are done every 3 months for 2 years post operatively followed by every 6 months for 2 years, and then annually. ? ? ? ?Otelia Limes, PTA ?10/02/2021, 8:25 AM ? ?  ? ?

## 2021-11-09 ENCOUNTER — Telehealth: Payer: Self-pay | Admitting: Hematology and Oncology

## 2021-11-09 NOTE — Telephone Encounter (Signed)
Called regarding 6/1 in basket message, pt requested to come next week, left pt message stating there is no availability until late June and to call back if she wanted to be pushed out that far

## 2021-11-10 ENCOUNTER — Inpatient Hospital Stay: Payer: Medicare Other | Attending: Oncology | Admitting: Hematology and Oncology

## 2021-11-10 DIAGNOSIS — Z853 Personal history of malignant neoplasm of breast: Secondary | ICD-10-CM | POA: Insufficient documentation

## 2021-11-10 DIAGNOSIS — I10 Essential (primary) hypertension: Secondary | ICD-10-CM | POA: Insufficient documentation

## 2021-11-10 DIAGNOSIS — Z79811 Long term (current) use of aromatase inhibitors: Secondary | ICD-10-CM | POA: Insufficient documentation

## 2021-11-10 NOTE — Progress Notes (Deleted)
[place]  Telephone:(336) [phone] [phone]     ID: Donna [name] DOB: 12-07-1945  MR#: [id number]  EHM#:[id number]  Patient Care Team: Montez Morita, PA-C as PCP - General (Hematology and Oncology) [name], MD as Consulting Physician (General Surgery) [name], MD as Consulting Physician (Orthopedic Surgery) [name], RN as Oncology Nurse Navigator [name], RN as Oncology Nurse Navigator Benay Pike, MD as Consulting Physician (Hematology and Oncology) Delice Bison, Charlestine Massed, NP as Nurse Practitioner (Hematology and Oncology) Harmon Pier, RN as Registered Nurse Eppie Gibson, MD as Attending Physician (Radiation Oncology) Benay Pike, MD OTHER MD: [name] MD ([facility]); [name] NP  CHIEF COMPLAINT: 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  She is normally active for her age she says.  A detailed review of systems today was otherwise noncontributory   COVID 19 VACCINATION STATUS: s/p Moderna x2   HISTORY OF CURRENT ILLNESS: From the original intake note:  Donna [name] herself palpated a mass in her left breast during showering.  She brought this to medical attention and on 12/16/2019 underwent mammography, the results of which I do not have today.  She proceeded to left breast ultrasound showing breast density B.  There was a mass in the upper inner quadrant of the left breast at the 10 o'clock position 7 cm from the nipple.  It measured 3.26 cm.  There are no ultrasound comments regarding the axilla.  Biopsy of the mass in question was performed 01/15/2020 at [place] in [facility] and the pathology report describes a biphasic tumor with malignant epithelial component and a mesenchymal component.  The epithelial component appears to squamoid and stain strongly for CK  8/18 by immunohistochemistry.  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  The tumor was essentially triple negative, estrogen receptor 0%, progesterone receptor 1% with 1+ intensity, HER-2 1+ by immunohistochemistry, Ki-67 12%.  (The 1% progesterone receptor positivity is best read as an artifact as the progesterone receptor does not function in the absence of a functional estrogen receptor).  The patient's subsequent history is as detailed below   PAST MEDICAL HISTORY: Past Medical History:  Diagnosis Date   Arthritis    oa   History of kidney stones    Hypertension    Personal history of chemotherapy    Personal history of radiation therapy     PAST SURGICAL HISTORY: Past Surgical History:  Procedure Laterality Date   BREAST BIOPSY     CYSTOSCOPY  yrs ago   IR IMAGING GUIDED PORT INSERTION  02/16/2020   MASTECTOMY     MASTECTOMY W/ SENTINEL NODE BIOPSY Left 08/29/2020   Procedure: LEFT MASTECTOMY WITH SENTINEL LYMPH NODE BIOPSY;  Surgeon: [name], MD;  Location: [facility];  Service: General;  Laterality: Left;  RNFA, PEC BLOCK   TOTAL KNEE ARTHROPLASTY Left 06/05/2016   Procedure: LEFT TOTAL KNEE ARTHROPLASTY;  Surgeon: [name], MD;  Location[facility];  Service: Orthopedics;  Laterality: Left;    FAMILY HISTORY No family history on file.  The patient has no information on her father.  Her mother died at the age of 78.  She had 1 sister and 2 brothers.  No one on that side of the family is known to have had cancer.  The patient herself had  5/2 siblings, 2 sisters and 3 brothers, none with a history of cancer.   GYNECOLOGIC HISTORY:  No LMP recorded (lmp unknown). Patient is postmenopausal. Menarche: 76 years old Age at first live birth: 76 years old Donna [name] P 2 LMP mid 39s Contraceptive no HRT no  Hysterectomy?  No Salpingo-oophorectomy?  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  She is divorced and her former husband subsequently died.  She lives by herself with no pets.  Her son Donna [name] 9 is a Production manager and travels a great deal as a Optometrist.  Daughter Donna [name] teaches middle school in [place].  The patient has 4 grandchildren.  She is a Psychologist, forensic.    ADVANCED DIRECTIVES: Not in place   HEALTH MAINTENANCE: Social History   Tobacco Use   Smoking status: Never   Smokeless tobacco: Never  Vaping Use   Vaping Use: Never used  Substance Use Topics   Alcohol use: No   Drug use: No     Colonoscopy: Never  PAP: Remote  Bone density: Remote   Allergies  Allergen Reactions   Benazepril Swelling    Patient had severe angioedema on 10/05/2015.    Current Outpatient Medications  Medication Sig Dispense Refill   amLODipine (NORVASC) 10 MG tablet Take 1 tablet (10 mg total) by mouth daily after lunch. 90 tablet 0   anastrozole (ARIMIDEX) 1 MG tablet TAKE 1 TABLET (1 MG TOTAL) BY MOUTH DAILY. START February 14, 2021 90 tablet 1   chlorthalidone (HYGROTON) 25 MG tablet Take 1 tablet (25 mg total) by mouth daily. 90 tablet 0   losartan (COZAAR) 50 MG tablet Take 1 tablet (50 mg total) by mouth daily. 90 tablet 0   Multiple Vitamins-Minerals (CENTRUM ADULTS) TABS Take 1 tablet by mouth daily.     potassium chloride (MICRO-K) 10 MEQ CR capsule Take 10 mEq by mouth 2 (two) times daily.     tamoxifen (NOLVADEX) 20 MG tablet Take 1 tablet (20 mg total) by mouth daily. 30 tablet 2   No current facility-administered medications for this visit.    OBJECTIVE: African-American woman who appears stated age There were no vitals filed for this visit.     There is no height or weight on file to calculate BMI.   Wt Readings from Last 3 Encounters:  10/02/21 149 lb 6 oz (67.8 kg)  08/10/21 150 lb 3.2 oz (68.1 kg)  07/03/21 147 lb 2 oz (66.7 kg)      ECOG FS:1 - Symptomatic but completely  ambulatory  Physical Exam Constitutional:      Appearance: Normal appearance.  Cardiovascular:     Rate and Rhythm: Normal rate and regular rhythm.     Pulses: Normal pulses.     Heart sounds: Normal heart sounds.  Pulmonary:     Effort: Pulmonary effort is normal.     Breath sounds: Normal breath sounds.  Chest:     Comments: Right breast with no palpable masses.  No regional adenopathy.  Left breast status postmastectomy with no concerns for recurrence. Abdominal:     General: Abdomen is flat. Bowel sounds are normal.  Musculoskeletal:        General: No swelling or tenderness.     Cervical back: Normal range of motion and neck supple. No rigidity.  Lymphadenopathy:     Cervical: No cervical adenopathy.  Skin:    General: Skin is warm and dry.  Neurological:  General: No focal deficit present.     Mental Status: She is alert.     LAB RESULTS:  CMP     Component Value Date/Time   NA 137 08/10/2021 0833   K 3.1 (L) 08/10/2021 0833   CL 103 08/10/2021 0833   CO2 27 08/10/2021 0833   GLUCOSE 110 (H) 08/10/2021 0833   BUN 13 08/10/2021 0833   CREATININE 0.86 08/10/2021 0833   CALCIUM 11.3 (H) 08/10/2021 0833   PROT 8.0 08/10/2021 0833   ALBUMIN 3.8 08/10/2021 0833   AST 11 (L) 08/10/2021 0833   ALT 8 08/10/2021 0833   ALKPHOS 83 08/10/2021 0833   BILITOT 0.3 08/10/2021 0833   GFRNONAA >60 08/10/2021 0833   GFRAA >60 03/08/2020 0906    No results found for: Ronnald Ramp, A1GS, A2GS, BETS, BETA2SER, GAMS, MSPIKE, SPEI  No results found for: Nils Pyle, [facility]  Lab Results  Component Value Date   WBC 5.8 08/10/2021   NEUTROABS 3.8 08/10/2021   HGB 13.7 08/10/2021   HCT 42.1 08/10/2021   MCV 85.2 08/10/2021   PLT 289 08/10/2021      Chemistry      Component Value Date/Time   NA 137 08/10/2021 0833   K 3.1 (L) 08/10/2021 0833   CL 103 08/10/2021 0833   CO2 27 08/10/2021 0833   BUN 13 08/10/2021 0833   CREATININE 0.86  08/10/2021 0833      Component Value Date/Time   CALCIUM 11.3 (H) 08/10/2021 0833   ALKPHOS 83 08/10/2021 0833   AST 11 (L) 08/10/2021 0833   ALT 8 08/10/2021 0833   BILITOT 0.3 08/10/2021 0833       No results found for: LABCA2  No components found for: QGBEEF007  No results for input(s): INR in the last 168 hours.  No results found for: LABCA2  No results found for: HQR975  No results found for: OIT254  No results found for: DIY641  No results found for: CA2729  No components found for: HGQUANT  No results found for: CEA1 / No results found for: CEA1   No results found for: AFPTUMOR  No results found for: CHROMOGRNA  No results found for: HGBA, HGBA2QUANT, HGBFQUANT, HGBSQUAN (Hemoglobinopathy evaluation)   No results found for: LDH  No results found for: IRON, TIBC, IRONPCTSAT (Iron and TIBC)  No results found for: FERRITIN  Urinalysis    Component Value Date/Time   COLORURINE YELLOW 05/24/2020 0950   APPEARANCEUR HAZY (A) 05/24/2020 0950   LABSPEC 1.015 05/24/2020 0950   PHURINE 6.0 05/24/2020 0950   GLUCOSEU NEGATIVE 05/24/2020 0950   HGBUR NEGATIVE 05/24/2020 [place] 05/24/2020 0950   KETONESUR NEGATIVE 05/24/2020 0950   PROTEINUR 100 (A) 05/24/2020 0950   NITRITE NEGATIVE 05/24/2020 0950   LEUKOCYTESUR SMALL (A) 05/24/2020 0950    STUDIES: No results found.   ELIGIBLE FOR AVAILABLE RESEARCH PROTOCOL: no  ASSESSMENT: 76 y.o. [place] woman status post left breast upper inner quadrant biopsy 01/15/2020 for a clinical T2 NX, stage IIB anaplastic carcinoma, triple negative, with an MIB-1 of 12%  (a) bone scan and CT chest 02/12/2020 show no evidence of metastatic disease  (1) neoadjuvant chemotherapy with doxorubicin and cyclophosphamide in dose dense fashion x4 started 02/23/2020, followed by weekly paclitaxel and carboplatin x12 starting 04/19/2020  (a) echo 02/12/2020 shows an ejection fraction in the  60-65% range  (2) left mastectomy and sentinel lymph node sampling 08/29/2020 showed a residual ypT2 ypN0 metaplastic carcinoma, grade  3, with negative margins.  (a) a total of 2 left axillary lymph nodes were removed  (b) repeat prognostic panel shows the tumor to be estrogen receptor negative but progesterone receptor positive at 5% with strong staining intensity.  HER2 was equivocal by immunotherapy but negative by FISH  (3) adjuvant radiation 12/01/2020 - 01/13/2021  (4) started anastrozole 02/14/2021  (5) consider Enhertu if disease recurrence   PLAN:  She was initially started on anastrazole but now on Tamoxifen. She is here for follow up on Tamoxifen.  Benay Pike, MD   11/10/2021 8:18 AM Medical Oncology and Hematology [facility] [place], [place] [address] Tel. [phone]    Fax. [phone]   *Total Encounter Time as defined by the Centers for Medicare and Medicaid Services includes, in addition to the face-to-face time of a patient visit (documented in the note above) non-face-to-face time: obtaining and reviewing outside history, ordering and reviewing medications, tests or procedures, care coordination (communications with other health care professionals or caregivers) and documentation in the medical record.

## 2021-11-13 ENCOUNTER — Telehealth: Payer: Self-pay | Admitting: Hematology and Oncology

## 2021-11-13 NOTE — Telephone Encounter (Signed)
Scheduled appointment per patient request. Patient aware.

## 2021-11-14 ENCOUNTER — Other Ambulatory Visit: Payer: Self-pay | Admitting: *Deleted

## 2021-11-14 DIAGNOSIS — C50212 Malignant neoplasm of upper-inner quadrant of left female breast: Secondary | ICD-10-CM

## 2021-11-14 DIAGNOSIS — C50919 Malignant neoplasm of unspecified site of unspecified female breast: Secondary | ICD-10-CM

## 2021-11-16 ENCOUNTER — Inpatient Hospital Stay (HOSPITAL_BASED_OUTPATIENT_CLINIC_OR_DEPARTMENT_OTHER): Payer: Medicare Other | Admitting: Hematology and Oncology

## 2021-11-16 ENCOUNTER — Other Ambulatory Visit: Payer: Self-pay

## 2021-11-16 ENCOUNTER — Encounter: Payer: Self-pay | Admitting: Hematology and Oncology

## 2021-11-16 ENCOUNTER — Telehealth: Payer: Self-pay | Admitting: *Deleted

## 2021-11-16 ENCOUNTER — Inpatient Hospital Stay: Payer: Medicare Other

## 2021-11-16 DIAGNOSIS — I10 Essential (primary) hypertension: Secondary | ICD-10-CM

## 2021-11-16 DIAGNOSIS — Z95828 Presence of other vascular implants and grafts: Secondary | ICD-10-CM

## 2021-11-16 DIAGNOSIS — C50912 Malignant neoplasm of unspecified site of left female breast: Secondary | ICD-10-CM

## 2021-11-16 DIAGNOSIS — C50919 Malignant neoplasm of unspecified site of unspecified female breast: Secondary | ICD-10-CM

## 2021-11-16 DIAGNOSIS — Z79811 Long term (current) use of aromatase inhibitors: Secondary | ICD-10-CM | POA: Diagnosis not present

## 2021-11-16 DIAGNOSIS — Z853 Personal history of malignant neoplasm of breast: Secondary | ICD-10-CM | POA: Diagnosis present

## 2021-11-16 DIAGNOSIS — C50212 Malignant neoplasm of upper-inner quadrant of left female breast: Secondary | ICD-10-CM

## 2021-11-16 LAB — CBC WITH DIFFERENTIAL (CANCER CENTER ONLY)
Abs Immature Granulocytes: 0.01 10*3/uL (ref 0.00–0.07)
Basophils Absolute: 0 10*3/uL (ref 0.0–0.1)
Basophils Relative: 0 %
Eosinophils Absolute: 0 10*3/uL (ref 0.0–0.5)
Eosinophils Relative: 0 %
HCT: 45 % (ref 36.0–46.0)
Hemoglobin: 15.1 g/dL — ABNORMAL HIGH (ref 12.0–15.0)
Immature Granulocytes: 0 %
Lymphocytes Relative: 32 %
Lymphs Abs: 1.7 10*3/uL (ref 0.7–4.0)
MCH: 28.9 pg (ref 26.0–34.0)
MCHC: 33.6 g/dL (ref 30.0–36.0)
MCV: 86 fL (ref 80.0–100.0)
Monocytes Absolute: 0.3 10*3/uL (ref 0.1–1.0)
Monocytes Relative: 6 %
Neutro Abs: 3.3 10*3/uL (ref 1.7–7.7)
Neutrophils Relative %: 62 %
Platelet Count: 247 10*3/uL (ref 150–400)
RBC: 5.23 MIL/uL — ABNORMAL HIGH (ref 3.87–5.11)
RDW: 14.5 % (ref 11.5–15.5)
WBC Count: 5.3 10*3/uL (ref 4.0–10.5)
nRBC: 0 % (ref 0.0–0.2)

## 2021-11-16 LAB — CMP (CANCER CENTER ONLY)
ALT: 10 U/L (ref 0–44)
AST: 13 U/L — ABNORMAL LOW (ref 15–41)
Albumin: 4.1 g/dL (ref 3.5–5.0)
Alkaline Phosphatase: 88 U/L (ref 38–126)
Anion gap: 6 (ref 5–15)
BUN: 12 mg/dL (ref 8–23)
CO2: 28 mmol/L (ref 22–32)
Calcium: 11.5 mg/dL — ABNORMAL HIGH (ref 8.9–10.3)
Chloride: 104 mmol/L (ref 98–111)
Creatinine: 0.81 mg/dL (ref 0.44–1.00)
GFR, Estimated: >60 mL/min (ref >60)
Glucose, Bld: 98 mg/dL (ref 70–99)
Potassium: 3.6 mmol/L (ref 3.5–5.1)
Sodium: 138 mmol/L (ref 135–145)
Total Bilirubin: 0.4 mg/dL (ref 0.3–1.2)
Total Protein: 8 g/dL (ref 6.5–8.1)

## 2021-11-16 MED ORDER — CHLORTHALIDONE 25 MG PO TABS: 25.0000 mg | ORAL_TABLET | Freq: Every day | ORAL | 0 refills | Status: AC

## 2021-11-16 MED ORDER — LOSARTAN POTASSIUM 50 MG PO TABS: 50.0000 mg | ORAL_TABLET | Freq: Every day | ORAL | 0 refills | Status: AC

## 2021-11-16 MED ORDER — HEPARIN SOD (PORK) LOCK FLUSH 100 UNIT/ML IV SOLN
500.0000 [IU] | Freq: Once | INTRAVENOUS | Status: AC | PRN
  Administered 2021-11-16: 500 [IU]

## 2021-11-16 MED ORDER — AMLODIPINE BESYLATE 10 MG PO TABS: 10.0000 mg | ORAL_TABLET | Freq: Every day | ORAL | 0 refills | Status: AC

## 2021-11-16 MED ORDER — SODIUM CHLORIDE 0.9% FLUSH
10.0000 mL | INTRAVENOUS | Status: DC | PRN
  Administered 2021-11-16: 10 mL

## 2021-11-16 NOTE — Telephone Encounter (Addendum)
-----   Message from Benay Pike, MD sent at 11/16/2021  4:49 PM EDT ----- Her calcium is running high, she should stop all calcium supplements. Can you let her know please.  Thanks,  This RN contacted the patient's daughter and caregiver - who states pt is not taking any calcium supplements.  The multivitamin she is taking does not have calcium in it.  This RN will send this note to MD per above for possible other causes including current prescription medications that may be increasing serum calcium.

## 2021-11-16 NOTE — Progress Notes (Signed)
[place]  [phone] [phone]) [phone]     ID: Valborg Friar DOB: 11/26/1945  MR#: [id number]  JFH#:[id number]  Patient Care Team: Montez Morita, PA-C as PCP - General (Hematology and Oncology) [name], MD as Consulting Physician (General Surgery) [name], MD as Consulting Physician (Orthopedic Surgery) [name], RN as Oncology Nurse Navigator [name], RN as Oncology Nurse Navigator Benay Pike, MD as Consulting Physician (Hematology and Oncology) Delice Bison, Charlestine Massed, NP as Nurse Practitioner (Hematology and Oncology) Harmon Pier, RN as Registered Nurse Eppie Gibson, MD as Attending Physician (Radiation Oncology) Benay Pike, MD OTHER MD: [name] MD ([facility]); [name] NP  CHIEF COMPLAINT: 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 She is tolerating this well. She denies any joint aches or bone aches. No changes in breast except the hyperpigmentation. No change in breathing, bowel habits or urinary habits No new headaches, falls. She ran out of HTN medication about a week ago.  REVIEW OF SYSTEMS:.  She is normally active for her age she says.  A detailed review of systems today was otherwise noncontributory   COVID 19 VACCINATION STATUS: s/p Moderna x2   HISTORY OF CURRENT ILLNESS: From the original intake note:  Gloristine herself palpated a mass in her left breast during showering.  She brought this to medical attention and on 12/16/2019 underwent mammography, the results of which I do not have today.  She proceeded to left breast ultrasound showing breast density B.  There was a mass in the upper inner quadrant of the left breast at the 10 o'clock position 7 cm from the nipple.  It measured 3.26 cm.  There are no ultrasound comments regarding the  axilla.  Biopsy of the mass in question was performed 01/15/2020 at [place] in [facility] and the pathology report describes a biphasic tumor with malignant epithelial component and a mesenchymal component.  The epithelial component appears to squamoid and stain strongly for CK 8/18 by immunohistochemistry.  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  The tumor was essentially triple negative, estrogen receptor 0%, progesterone receptor 1% with 1+ intensity, HER-2 1+ by immunohistochemistry, Ki-67 12%.  (The 1% progesterone receptor positivity is best read as an artifact as the progesterone receptor does not function in the absence of a functional estrogen receptor).  The patient's subsequent history is as detailed below   PAST MEDICAL HISTORY: Past Medical History:  Diagnosis Date   Arthritis    oa   History of kidney stones    Hypertension    Personal history of chemotherapy    Personal history of radiation therapy     PAST SURGICAL HISTORY: Past Surgical History:  Procedure Laterality Date   BREAST BIOPSY     CYSTOSCOPY  yrs ago   IR IMAGING GUIDED PORT INSERTION  02/16/2020   MASTECTOMY     MASTECTOMY W/ SENTINEL NODE BIOPSY Left 08/29/2020   Procedure: LEFT MASTECTOMY WITH SENTINEL LYMPH NODE BIOPSY;  Surgeon: [name], MD;  Location: [facility];  Service: General;  Laterality: Left;  RNFA, PEC BLOCK   TOTAL KNEE ARTHROPLASTY Left 06/05/2016   Procedure: LEFT TOTAL KNEE ARTHROPLASTY;  Surgeon: [name], MD;  [facility];  Service: Orthopedics;  Laterality: Left;    FAMILY HISTORY No family history  on file.  The patient has no information on her father.  Her mother died at the age of 22.  She had 1 sister and 2 brothers.  No one on that side of the family is known to have had cancer.  The patient herself had 5/2 siblings, 2 sisters and 3 brothers, none with a history of  cancer.   GYNECOLOGIC HISTORY:  No LMP recorded (lmp unknown). Patient is postmenopausal. Menarche: 76 years old Age at first live birth: 76 years old [place] P 2 LMP mid 34s Contraceptive no HRT no  Hysterectomy?  No Salpingo-oophorectomy?  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  She is divorced and her former husband subsequently died.  She lives by herself with no pets.  Her son [name] 55 is a Production manager and travels a great deal as a Optometrist.  Daughter [name] teaches middle school in [place].  The patient has 4 grandchildren.  She is a Psychologist, forensic.    ADVANCED DIRECTIVES: Not in place   HEALTH MAINTENANCE: Social History   Tobacco Use   Smoking status: Never   Smokeless tobacco: Never  Vaping Use   Vaping Use: Never used  Substance Use Topics   Alcohol use: No   Drug use: No     Colonoscopy: Never  PAP: Remote  Bone density: Remote   Allergies  Allergen Reactions   Benazepril Swelling    Patient had severe angioedema on 10/05/2015.    Current Outpatient Medications  Medication Sig Dispense Refill   amLODipine (NORVASC) 10 MG tablet Take 1 tablet (10 mg total) by mouth daily after lunch. 30 tablet 0   anastrozole (ARIMIDEX) 1 MG tablet TAKE 1 TABLET (1 MG TOTAL) BY MOUTH DAILY. START February 14, 2021 90 tablet 1   chlorthalidone (HYGROTON) 25 MG tablet Take 1 tablet (25 mg total) by mouth daily. 30 tablet 0   losartan (COZAAR) 50 MG tablet Take 1 tablet (50 mg total) by mouth daily. 30 tablet 0   Multiple Vitamins-Minerals (CENTRUM ADULTS) TABS Take 1 tablet by mouth daily.     potassium chloride (MICRO-K) 10 MEQ CR capsule Take 10 mEq by mouth 2 (two) times daily.     tamoxifen (NOLVADEX) 20 MG tablet Take 1 tablet (20 mg total) by mouth daily. 30 tablet 2   No current facility-administered medications for this visit.    OBJECTIVE: African-American woman who appears stated age 76:    11/16/21 1338  BP: (!) 163/86  Pulse: 90  Resp: 18  Temp: (!) 97.3 F (36.3 C)  SpO2: 98%       Body mass index is 25.21 kg/m.   Wt Readings from Last 3 Encounters:  11/16/21 151 lb 8 oz (68.7 kg)  10/02/21 149 lb 6 oz (67.8 kg)  08/10/21 150 lb 3.2 oz (68.1 kg)      ECOG FS:1 - Symptomatic but completely ambulatory  Physical Exam Constitutional:      Appearance: Normal appearance.  Cardiovascular:     Rate and Rhythm: Normal rate and regular rhythm.     Pulses: Normal pulses.     Heart sounds: Normal heart sounds.  Pulmonary:     Effort: Pulmonary effort is normal.     Breath sounds: Normal breath sounds.  Chest:     Comments: Right breast with no palpable masses.  No regional adenopathy.  Left breast status postmastectomy with no concerns for recurrence. Abdominal:  General: Abdomen is flat. Bowel sounds are normal.  Musculoskeletal:        General: No swelling or tenderness.     Cervical back: Normal range of motion and neck supple. No rigidity.  Lymphadenopathy:     Cervical: No cervical adenopathy.  Skin:    General: Skin is warm and dry.  Neurological:     General: No focal deficit present.     Mental Status: She is alert.      LAB RESULTS:  CMP     Component Value Date/Time   NA 138 11/16/2021 1313   K 3.6 11/16/2021 1313   CL 104 11/16/2021 1313   CO2 28 11/16/2021 1313   GLUCOSE 98 11/16/2021 1313   BUN 12 11/16/2021 1313   CREATININE 0.81 11/16/2021 1313   CALCIUM 11.5 (H) 11/16/2021 1313   PROT 8.0 11/16/2021 1313   ALBUMIN 4.1 11/16/2021 1313   AST 13 (L) 11/16/2021 1313   ALT 10 11/16/2021 1313   ALKPHOS 88 11/16/2021 1313   BILITOT 0.4 11/16/2021 1313   GFRNONAA >60 11/16/2021 1313   GFRAA >60 03/08/2020 0906    No results found for: "TOTALPROTELP", "ALBUMINELP", "A1GS", "A2GS", "BETS", "BETA2SER", "GAMS", "MSPIKE", "SPEI"  No results found for: "KPAFRELGTCHN", "LAMBDASER", "KAPLAMBRATIO"  Lab Results  Component Value Date    WBC 5.3 11/16/2021   NEUTROABS 3.3 11/16/2021   HGB 15.1 (H) 11/16/2021   HCT 45.0 11/16/2021   MCV 86.0 11/16/2021   PLT 247 11/16/2021      Chemistry      Component Value Date/Time   NA 138 11/16/2021 1313   K 3.6 11/16/2021 1313   CL 104 11/16/2021 1313   CO2 28 11/16/2021 1313   BUN 12 11/16/2021 1313   CREATININE 0.81 11/16/2021 1313      Component Value Date/Time   CALCIUM 11.5 (H) 11/16/2021 1313   ALKPHOS 88 11/16/2021 1313   AST 13 (L) 11/16/2021 1313   ALT 10 11/16/2021 1313   BILITOT 0.4 11/16/2021 1313       No results found for: "LABCA2"  No components found for: "TZGYFV494"  No results for input(s): "INR" in the last 168 hours.  No results found for: "LABCA2"  No results found for: "WHQ759"  No results found for: "CAN125"  No results found for: "CAN153"  No results found for: "CA2729"  No components found for: "HGQUANT"  No results found for: "CEA1", "CEA" / No results found for: "CEA1", "CEA"   No results found for: "AFPTUMOR"  No results found for: "CHROMOGRNA"  No results found for: "HGBA", "HGBA2QUANT", "HGBFQUANT", "HGBSQUAN" (Hemoglobinopathy evaluation)   No results found for: "LDH"  No results found for: "IRON", "TIBC", "IRONPCTSAT" (Iron and TIBC)  No results found for: "FERRITIN"  Urinalysis    Component Value Date/Time   COLORURINE YELLOW 05/24/2020 0950   APPEARANCEUR HAZY (A) 05/24/2020 0950   LABSPEC 1.015 05/24/2020 0950   PHURINE 6.0 05/24/2020 0950   GLUCOSEU NEGATIVE 05/24/2020 0950   HGBUR NEGATIVE 05/24/2020 [place] 05/24/2020 0950   KETONESUR NEGATIVE 05/24/2020 0950   PROTEINUR 100 (A) 05/24/2020 0950   NITRITE NEGATIVE 05/24/2020 0950   LEUKOCYTESUR SMALL (A) 05/24/2020 0950    STUDIES: No results found.   ELIGIBLE FOR AVAILABLE RESEARCH PROTOCOL: no  ASSESSMENT: 76 y.o. [place] woman status post left breast upper inner quadrant biopsy 01/15/2020 for a clinical  T2 NX, stage IIB anaplastic carcinoma, triple negative, with an MIB-1 of 12%  (a) bone  scan and CT chest 02/12/2020 show no evidence of metastatic disease  (1) neoadjuvant chemotherapy with doxorubicin and cyclophosphamide in dose dense fashion x4 started 02/23/2020, followed by weekly paclitaxel and carboplatin x12 starting 04/19/2020  (a) echo 02/12/2020 shows an ejection fraction in the 60-65% range  (2) left mastectomy and sentinel lymph node sampling 08/29/2020 showed a residual ypT2 ypN0 metaplastic carcinoma, grade 3, with negative margins.  (a) a total of 2 left axillary lymph nodes were removed  (b) repeat prognostic panel shows the tumor to be estrogen receptor negative but progesterone receptor positive at 5% with strong staining intensity.  HER2 was equivocal by immunotherapy but negative by FISH  (3) adjuvant radiation 12/01/2020 - 01/13/2021  (4) started anastrozole 02/14/2021  (5) consider Enhertu if disease recurrence   PLAN:  She was initially started on anastrazole but now on Tamoxifen. She is here for follow up on Tamoxifen.  She is tolerating tamoxifen well.  She denies any more arthralgias with tamoxifen.  She continues to have some pins and needle sensation from prior chemotherapy which is stable and mild. No new changes on breast exam today.  Left breast status postmastectomy.  Right breast normal to inspection and palpation. She requested some refill for her hypertension medications since she ran out of them, 30-day refill provided.  She has a follow-up with her PCP in 3 weeks to have these refilled in the long-term. Today she complains of no new review of systems which would suggest disease recurrence.  She wanted to think about the port since it is easy for her to have blood draws from it. She will return to clinic in 6 months or sooner as needed.   Benay Pike, MD   11/16/2021 1:58 PM Medical Oncology and Hematology [facility] [place], [place] [address] Tel. [phone]    Fax. [phone]  Total time spent: 30 minutes  *Total Encounter Time as defined by the Centers for Medicare and Medicaid Services includes, in addition to the face-to-face time of a patient visit (documented in the note above) non-face-to-face time: obtaining and reviewing outside history, ordering and reviewing medications, tests or procedures, care coordination (communications with other health care professionals or caregivers) and documentation in the medical record.

## 2021-11-17 ENCOUNTER — Encounter: Payer: Self-pay | Admitting: Oncology

## 2021-11-17 NOTE — Progress Notes (Signed)
  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[place]) Staging form: Breast, AJCC 8th Edition - Clinical: Stage Unknown (cT2, cNX, cM0(i+), G3, ER-, PR-, HER2-) - Signed by Chauncey Cruel, MD on 04/05/2020 Histologic grading system: 3 grade system - Pathologic stage from 08/29/2020: No Stage Recommended (ypT2, pN0, cM0, G3, ER-, PR-, HER2-) - Signed by Gardenia Phlegm, NP on 09/07/2020 Stage prefix: Post-therapy Histologic grading system: 3 grade system       Indication for treatment:  curative       Radiation treatment dates:   12/01/20 to 01/13/21  Site/dose:   1) Left Chest wall breathhold / 50 Gy in 25 fractions 2) Left SCV_PAB nodes breathhold / 50Gy in 25 fractions 3) Left CW Boost / 10 Gy in 5 fractions  Beams/energy:   1) 3D /6 and 10 MV photons 2) 3D /6 and 10 MV photons 3) electrons /6 MeV  Narrative: The patient tolerated radiation treatment relatively well.     Plan: The patient has completed radiation treatment. The patient will return to radiation oncology clinic for routine followup in one month. I advised them to call or return sooner if they have any questions or concerns related to their recovery or treatment.  -----------------------------------  Eppie Gibson, MD

## 2022-01-11 ENCOUNTER — Encounter: Payer: Self-pay | Admitting: Oncology

## 2022-02-05 ENCOUNTER — Ambulatory Visit: Payer: Medicare Other | Attending: Radiation Oncology

## 2022-02-05 VITALS — Wt 151.4 lb

## 2022-02-05 DIAGNOSIS — Z483 Aftercare following surgery for neoplasm: Secondary | ICD-10-CM | POA: Insufficient documentation

## 2022-02-05 NOTE — Therapy (Signed)
  OUTPATIENT PHYSICAL THERAPY SOZO SCREENING NOTE   Patient Name: Donna [name] MRN: [id number] DOB:05-31-1946, 76 y.o., female Today's Date: 02/05/2022  PCP: Montez Morita, PA-C REFERRING PROVIDER: Eppie Gibson, MD   PT End of Session - 02/05/22 [id number]     Visit Number 18   # unchanged due to screen only   PT Start Time 0814    PT Stop Time 0819    PT Time Calculation (min) 5 min    Activity Tolerance Patient tolerated treatment well    Behavior During Therapy [facility] for tasks assessed/performed             Past Medical History:  Diagnosis Date   Arthritis    oa   History of kidney stones    Hypertension    Personal history of chemotherapy    Personal history of radiation therapy    Past Surgical History:  Procedure Laterality Date   BREAST BIOPSY     CYSTOSCOPY  yrs ago   IR IMAGING GUIDED PORT INSERTION  02/16/2020   MASTECTOMY     MASTECTOMY W/ SENTINEL NODE BIOPSY Left 08/29/2020   Procedure: LEFT MASTECTOMY WITH SENTINEL LYMPH NODE BIOPSY;  Surgeon: [name], MD;  Location: [place] [facility];  Service: General;  Laterality: Left;  RNFA, PEC BLOCK   TOTAL KNEE ARTHROPLASTY Left 06/05/2016   Procedure: LEFT TOTAL KNEE ARTHROPLASTY;  Surgeon: [name], MD;  Location: [facility];  Service: Orthopedics;  Laterality: Left;   Patient Active Problem List   Diagnosis Date Noted   Cancer of left female breast ([place]) 08/29/2020   Port-A-Cath in place 03/22/2020   Malignant neoplasm of upper-inner quadrant of left breast in female, estrogen receptor negative ([place]) 02/04/2020   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  She  is presently staying in town with her children but is looking forward to going home to [place].   PRECAUTIONS: left UE Lymphedema risk, None  SUBJECTIVE: Pt returns for her 3 month L-Dex screen.   PAIN:  Are you having pain? No  SOZO SCREENING: Patient was assessed today using the SOZO machine to determine the lymphedema index score. This was compared to her baseline score. It was determined that she is within the recommended range when compared to her baseline and no further action is needed at this time. She will continue SOZO screenings. These are done every 3 months for 2 years post operatively followed by every 6 months for 2 years, and then annually.   L-DEX FLOWSHEETS - 02/05/22 0800       L-DEX LYMPHEDEMA SCREENING   Measurement Type Unilateral    L-DEX MEASUREMENT EXTREMITY Upper Extremity    POSITION  Standing    DOMINANT SIDE Right    At Risk Side Left    BASELINE SCORE (UNILATERAL) -5.9    L-DEX SCORE (UNILATERAL) -6.1    VALUE CHANGE (UNILAT) -0.2              Otelia Limes, PTA 02/05/2022, 8:20 AM

## 2022-02-13 ENCOUNTER — Other Ambulatory Visit: Payer: Self-pay | Admitting: *Deleted

## 2022-02-22 ENCOUNTER — Encounter: Payer: Self-pay | Admitting: Oncology

## 2022-02-22 NOTE — Telephone Encounter (Signed)
No entry 

## 2022-05-07 ENCOUNTER — Ambulatory Visit: Payer: Medicare Other | Attending: Radiation Oncology

## 2022-05-07 DIAGNOSIS — Z483 Aftercare following surgery for neoplasm: Secondary | ICD-10-CM | POA: Insufficient documentation

## 2022-05-18 ENCOUNTER — Other Ambulatory Visit: Payer: Self-pay | Admitting: *Deleted

## 2022-05-18 DIAGNOSIS — C50212 Malignant neoplasm of upper-inner quadrant of left female breast: Secondary | ICD-10-CM

## 2022-05-21 ENCOUNTER — Encounter: Payer: Self-pay | Admitting: Hematology and Oncology

## 2022-05-21 ENCOUNTER — Other Ambulatory Visit: Payer: Self-pay

## 2022-05-21 ENCOUNTER — Inpatient Hospital Stay: Payer: Medicare Other | Attending: Hematology and Oncology | Admitting: Hematology and Oncology

## 2022-05-21 ENCOUNTER — Inpatient Hospital Stay: Payer: Medicare Other

## 2022-05-21 ENCOUNTER — Telehealth: Payer: Self-pay

## 2022-05-21 VITALS — BP 149/87 | HR 83 | Temp 97.8°F | Resp 16 | Ht 65.0 in | Wt 153.6 lb

## 2022-05-21 DIAGNOSIS — C50212 Malignant neoplasm of upper-inner quadrant of left female breast: Secondary | ICD-10-CM | POA: Diagnosis present

## 2022-05-21 DIAGNOSIS — Z9221 Personal history of antineoplastic chemotherapy: Secondary | ICD-10-CM | POA: Diagnosis not present

## 2022-05-21 DIAGNOSIS — M810 Age-related osteoporosis without current pathological fracture: Secondary | ICD-10-CM | POA: Diagnosis not present

## 2022-05-21 DIAGNOSIS — Z923 Personal history of irradiation: Secondary | ICD-10-CM | POA: Insufficient documentation

## 2022-05-21 DIAGNOSIS — I10 Essential (primary) hypertension: Secondary | ICD-10-CM | POA: Diagnosis not present

## 2022-05-21 DIAGNOSIS — Z9012 Acquired absence of left breast and nipple: Secondary | ICD-10-CM | POA: Diagnosis not present

## 2022-05-21 DIAGNOSIS — Z171 Estrogen receptor negative status [ER-]: Secondary | ICD-10-CM | POA: Diagnosis not present

## 2022-05-21 DIAGNOSIS — Z95828 Presence of other vascular implants and grafts: Secondary | ICD-10-CM

## 2022-05-21 LAB — CBC WITH DIFFERENTIAL (CANCER CENTER ONLY)
Abs Immature Granulocytes: 0.01 10*3/uL (ref 0.00–0.07)
Basophils Absolute: 0 10*3/uL (ref 0.0–0.1)
Basophils Relative: 1 %
Eosinophils Absolute: 0 10*3/uL (ref 0.0–0.5)
Eosinophils Relative: 0 %
HCT: 45.7 % (ref 36.0–46.0)
Hemoglobin: 15.3 g/dL — ABNORMAL HIGH (ref 12.0–15.0)
Immature Granulocytes: 0 %
Lymphocytes Relative: 41 %
Lymphs Abs: 1.7 10*3/uL (ref 0.7–4.0)
MCH: 28.4 pg (ref 26.0–34.0)
MCHC: 33.5 g/dL (ref 30.0–36.0)
MCV: 84.8 fL (ref 80.0–100.0)
Monocytes Absolute: 0.4 10*3/uL (ref 0.1–1.0)
Monocytes Relative: 9 %
Neutro Abs: 2 10*3/uL (ref 1.7–7.7)
Neutrophils Relative %: 49 %
Platelet Count: 278 10*3/uL (ref 150–400)
RBC: 5.39 MIL/uL — ABNORMAL HIGH (ref 3.87–5.11)
RDW: 13.5 % (ref 11.5–15.5)
WBC Count: 4.1 10*3/uL (ref 4.0–10.5)
nRBC: 0 % (ref 0.0–0.2)

## 2022-05-21 LAB — CMP (CANCER CENTER ONLY)
ALT: 9 U/L (ref 0–44)
AST: 12 U/L — ABNORMAL LOW (ref 15–41)
Albumin: 4 g/dL (ref 3.5–5.0)
Alkaline Phosphatase: 81 U/L (ref 38–126)
Anion gap: 7 (ref 5–15)
BUN: 17 mg/dL (ref 8–23)
CO2: 28 mmol/L (ref 22–32)
Calcium: 11.5 mg/dL — ABNORMAL HIGH (ref 8.9–10.3)
Chloride: 102 mmol/L (ref 98–111)
Creatinine: 0.8 mg/dL (ref 0.44–1.00)
GFR, Estimated: >60 mL/min (ref >60)
Glucose, Bld: 133 mg/dL — ABNORMAL HIGH (ref 70–99)
Potassium: 2.9 mmol/L — ABNORMAL LOW (ref 3.5–5.1)
Sodium: 137 mmol/L (ref 135–145)
Total Bilirubin: 0.4 mg/dL (ref 0.3–1.2)
Total Protein: 7.6 g/dL (ref 6.5–8.1)

## 2022-05-21 MED ORDER — POTASSIUM CHLORIDE ER 10 MEQ PO CPCR: 10.0000 meq | ORAL_CAPSULE | Freq: Two times a day (BID) | ORAL | 2 refills | Status: DC

## 2022-05-21 MED ORDER — HEPARIN SOD (PORK) LOCK FLUSH 100 UNIT/ML IV SOLN
500.0000 [IU] | Freq: Once | INTRAVENOUS | Status: AC | PRN
  Administered 2022-05-21: 500 [IU]

## 2022-05-21 MED ORDER — SODIUM CHLORIDE 0.9% FLUSH
10.0000 mL | INTRAVENOUS | Status: DC | PRN
  Administered 2022-05-21: 10 mL

## 2022-05-21 NOTE — Telephone Encounter (Signed)
TCT daughter to advise that potassium prescription has been sent to patient's pharmacy. Reviewed prescribing information.

## 2022-05-21 NOTE — Progress Notes (Signed)
[facility]  [phone] [phone]     ID: Donna [name] DOB: 06/01/1946  MR#: [id number]  KVQ#:[id number]  Patient Care Team: Montez Morita, PA-C as PCP - General (Hematology and Oncology) [name], MD as Consulting Physician (General Surgery) [name], MD as Consulting Physician (Orthopedic Surgery) [name], RN as Oncology Nurse Navigator [name], RN as Oncology Nurse Navigator Benay Pike, MD as Consulting Physician (Hematology and Oncology) Delice Bison, Charlestine Massed, NP as Nurse Practitioner (Hematology and Oncology) Harmon Pier, RN as Registered Nurse Eppie Gibson, MD as Attending Physician (Radiation Oncology) Benay Pike, MD OTHER MD: [name] MD ([place]); [name] NP  CHIEF COMPLAINT: progesterone receptor positive breast cancer (s/p left mastectomy)  CURRENT TREATMENT: Tamoxifen  INTERVAL HISTORY: Donna [name] returns today for follow up  She is now on tamoxifen.  Bone density in Feb 2023 showed osteoporosis. She denies any new breast changes. She is overdue for her right sided mammogram She is not on any Vit D at this time. She was hoping to see her dentist in January for dental clearance  REVIEW OF SYSTEMS:.  She is normally active for her age she says.  A detailed review of systems today was otherwise noncontributory   COVID 19 VACCINATION STATUS: s/p Moderna x2   HISTORY OF CURRENT ILLNESS: From the original intake note:  Donna [name] herself palpated a mass in her left breast during showering.  She brought this to medical attention and on 12/16/2019 underwent mammography, the results of which I do not have today.  She proceeded to left breast ultrasound showing breast density B.  There was a mass in the upper inner quadrant of the left breast at the 10 o'clock position 7 cm from the nipple.  It measured 3.26 cm.  There are no ultrasound comments regarding the axilla.  Biopsy of the mass  in question was performed 01/15/2020 at [facility] and the pathology report describes a biphasic tumor with malignant epithelial component and a mesenchymal component.  The epithelial component appears to squamoid and stain strongly for CK 8/18 by immunohistochemistry.  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  The tumor was essentially triple negative, estrogen receptor 0%, progesterone receptor 1% with 1+ intensity, HER-2 1+ by immunohistochemistry, Ki-67 12%.  (The 1% progesterone receptor positivity is best read as an artifact as the progesterone receptor does not function in the absence of a functional estrogen receptor).  The patient's subsequent history is as detailed below   PAST MEDICAL HISTORY: Past Medical History:  Diagnosis Date   Arthritis    oa   History of kidney stones    Hypertension    Personal history of chemotherapy    Personal history of radiation therapy     PAST SURGICAL HISTORY: Past Surgical History:  Procedure Laterality Date   BREAST BIOPSY     CYSTOSCOPY  yrs ago   IR IMAGING GUIDED PORT INSERTION  02/16/2020   MASTECTOMY     MASTECTOMY W/ SENTINEL NODE BIOPSY Left 08/29/2020   Procedure: LEFT MASTECTOMY WITH SENTINEL LYMPH NODE BIOPSY;  Surgeon: [name], MD;  Location: [facility];  Service: General;  Laterality: Left;  RNFA, PEC BLOCK   TOTAL KNEE ARTHROPLASTY Left 06/05/2016   Procedure: LEFT TOTAL KNEE ARTHROPLASTY;  Surgeon: [name], MD;  Location[facility];  Service: Orthopedics;  Laterality: Left;    FAMILY HISTORY No family  history on file.  The patient has no information on her father.  Her mother died at the age of 72.  She had 1 sister and 2 brothers.  No one on that side of the family is known to have had cancer.  The patient herself had 5/2 siblings, 2 sisters and 3 brothers, none with a history of cancer.   GYNECOLOGIC HISTORY:  No  LMP recorded (lmp unknown). Patient is postmenopausal. Menarche: 76 years old Age at first live birth: 76 years old [place] P 2 LMP mid 57s Contraceptive no HRT no  Hysterectomy?  No Salpingo-oophorectomy?  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  She is divorced and her former husband subsequently died.  She lives by herself with no pets.  Her son Donna [name] 52 is a Production manager and travels a great deal as a Optometrist.  Daughter Donna [name] teaches middle school in [place].  The patient has 4 grandchildren.  She is a Psychologist, forensic.    ADVANCED DIRECTIVES: Not in place   HEALTH MAINTENANCE: Social History   Tobacco Use   Smoking status: Never   Smokeless tobacco: Never  Vaping Use   Vaping Use: Never used  Substance Use Topics   Alcohol use: No   Drug use: No     Colonoscopy: Never  PAP: Remote  Bone density: Remote   Allergies  Allergen Reactions   Benazepril Swelling    Patient had severe angioedema on 10/05/2015.    Current Outpatient Medications  Medication Sig Dispense Refill   amLODipine (NORVASC) 10 MG tablet Take 1 tablet (10 mg total) by mouth daily after lunch. 30 tablet 0   anastrozole (ARIMIDEX) 1 MG tablet TAKE 1 TABLET (1 MG TOTAL) BY MOUTH DAILY. START February 14, 2021 90 tablet 1   chlorthalidone (HYGROTON) 25 MG tablet Take 1 tablet (25 mg total) by mouth daily. 30 tablet 0   losartan (COZAAR) 50 MG tablet Take 1 tablet (50 mg total) by mouth daily. 30 tablet 0   Multiple Vitamins-Minerals (CENTRUM ADULTS) TABS Take 1 tablet by mouth daily.     potassium chloride (MICRO-K) 10 MEQ CR capsule Take 10 mEq by mouth 2 (two) times daily.     tamoxifen (NOLVADEX) 20 MG tablet Take 1 tablet (20 mg total) by mouth daily. 30 tablet 2   No current facility-administered medications for this visit.   Facility-Administered Medications Ordered in Other Visits  Medication Dose Route Frequency Provider Last Rate Last  Admin   sodium chloride flush (NS) 0.9 % injection 10 mL  10 mL Intracatheter PRN Benay Pike, MD   10 mL at 05/21/22 5397    OBJECTIVE: African-American woman who appears stated age There were no vitals filed for this visit.      There is no height or weight on file to calculate BMI.   Wt Readings from Last 3 Encounters:  02/05/22 151 lb 6 oz (68.7 kg)  11/16/21 151 lb 8 oz (68.7 kg)  10/02/21 149 lb 6 oz (67.8 kg)      ECOG FS:1 - Symptomatic but completely ambulatory  Physical Exam Constitutional:      Appearance: Normal appearance.  Cardiovascular:     Rate and Rhythm: Normal rate and regular rhythm.     Pulses: Normal pulses.     Heart sounds: Normal heart sounds.  Pulmonary:     Effort: Pulmonary effort is normal.     Breath sounds: Normal breath  sounds.  Chest:     Comments: Right breast with no palpable masses.  No regional adenopathy.  Left breast status postmastectomy with no concerns for recurrence. Abdominal:     General: Abdomen is flat. Bowel sounds are normal.  Musculoskeletal:        General: No swelling or tenderness.     Cervical back: Normal range of motion and neck supple. No rigidity.  Lymphadenopathy:     Cervical: No cervical adenopathy.  Skin:    General: Skin is warm and dry.  Neurological:     General: No focal deficit present.     Mental Status: She is alert.      LAB RESULTS:  CMP     Component Value Date/Time   NA 138 11/16/2021 1313   K 3.6 11/16/2021 1313   CL 104 11/16/2021 1313   CO2 28 11/16/2021 1313   GLUCOSE 98 11/16/2021 1313   BUN 12 11/16/2021 1313   CREATININE 0.81 11/16/2021 1313   CALCIUM 11.5 (H) 11/16/2021 1313   PROT 8.0 11/16/2021 1313   ALBUMIN 4.1 11/16/2021 1313   AST 13 (L) 11/16/2021 1313   ALT 10 11/16/2021 1313   ALKPHOS 88 11/16/2021 1313   BILITOT 0.4 11/16/2021 1313   GFRNONAA >60 11/16/2021 1313   GFRAA >60 03/08/2020 0906    No results found for: "TOTALPROTELP", "ALBUMINELP", "A1GS",  "A2GS", "BETS", "BETA2SER", "GAMS", "MSPIKE", "SPEI"  No results found for: "KPAFRELGTCHN", "LAMBDASER", "KAPLAMBRATIO"  Lab Results  Component Value Date   WBC 4.1 05/21/2022   NEUTROABS 2.0 05/21/2022   HGB 15.3 (H) 05/21/2022   HCT 45.7 05/21/2022   MCV 84.8 05/21/2022   PLT 278 05/21/2022      Chemistry      Component Value Date/Time   NA 138 11/16/2021 1313   K 3.6 11/16/2021 1313   CL 104 11/16/2021 1313   CO2 28 11/16/2021 1313   BUN 12 11/16/2021 1313   CREATININE 0.81 11/16/2021 1313      Component Value Date/Time   CALCIUM 11.5 (H) 11/16/2021 1313   ALKPHOS 88 11/16/2021 1313   AST 13 (L) 11/16/2021 1313   ALT 10 11/16/2021 1313   BILITOT 0.4 11/16/2021 1313       No results found for: "LABCA2"  No components found for: "FIEPPI951"  No results for input(s): "INR" in the last 168 hours.  No results found for: "LABCA2"  No results found for: "OAC166"  No results found for: "CAN125"  No results found for: "CAN153"  No results found for: "CA2729"  No components found for: "HGQUANT"  No results found for: "CEA1", "CEA" / No results found for: "CEA1", "CEA"   No results found for: "AFPTUMOR"  No results found for: "CHROMOGRNA"  No results found for: "HGBA", "HGBA2QUANT", "HGBFQUANT", "HGBSQUAN" (Hemoglobinopathy evaluation)   No results found for: "LDH"  No results found for: "IRON", "TIBC", "IRONPCTSAT" (Iron and TIBC)  No results found for: "FERRITIN"  Urinalysis    Component Value Date/Time   COLORURINE YELLOW 05/24/2020 0950   APPEARANCEUR HAZY (A) 05/24/2020 0950   LABSPEC 1.015 05/24/2020 0950   PHURINE 6.0 05/24/2020 0950   GLUCOSEU NEGATIVE 05/24/2020 0950   HGBUR NEGATIVE 05/24/2020 [place] 05/24/2020 0950   KETONESUR NEGATIVE 05/24/2020 0950   PROTEINUR 100 (A) 05/24/2020 0950   NITRITE NEGATIVE 05/24/2020 0950   LEUKOCYTESUR SMALL (A) 05/24/2020 0950    STUDIES: No results found.   ELIGIBLE FOR  AVAILABLE RESEARCH PROTOCOL: no  ASSESSMENT: 76  y.o. [facility] [place] woman status post left breast upper inner quadrant biopsy 01/15/2020 for a clinical T2 NX, stage IIB anaplastic carcinoma, triple negative, with an MIB-1 of 12%  (a) bone scan and CT chest 02/12/2020 show no evidence of metastatic disease  (1) neoadjuvant chemotherapy with doxorubicin and cyclophosphamide in dose dense fashion x4 started 02/23/2020, followed by weekly paclitaxel and carboplatin x12 starting 04/19/2020  (a) echo 02/12/2020 shows an ejection fraction in the 60-65% range  (2) left mastectomy and sentinel lymph node sampling 08/29/2020 showed a residual ypT2 ypN0 metaplastic carcinoma, grade 3, with negative margins.  (a) a total of 2 left axillary lymph nodes were removed  (b) repeat prognostic panel shows the tumor to be estrogen receptor negative but progesterone receptor positive at 5% with strong staining intensity.  HER2 was equivocal by immunotherapy but negative by FISH  (3) adjuvant radiation 12/01/2020 - 01/13/2021  (4) started anastrozole 02/14/2021, switched to Tamoxifen for PR positive at 5%  (5) consider [place] if disease recurrence   PLAN: She is here for a follow up She remains compliant with Tamoxifen, no concerns. Physical exam today with no evidence of new breast masses or axillary LN. She is s/p left mastectomy. She is overdue for mammogram, recommended to have this scheduled. From bone density standpoint, she osteoporosis.  We discussed risk of osteonecrosis of the jaw.  Dental clearance form given.  She wants to see the dentist in January and will get back to [place].  Once we have can start her on some bisphosphonates. She will continue vitamin D supplementation and regular exercise. Return to clinic in 1 year or sooner as needed  Benay Pike, MD   05/21/2022 8:25 AM Medical Oncology and Hematology [facility] [place], [place] [address] Tel.  [phone]    Fax. [phone]  Total time spent: 30 minutes  *Total Encounter Time as defined by the Centers for Medicare and Medicaid Services includes, in addition to the face-to-face time of a patient visit (documented in the note above) non-face-to-face time: obtaining and reviewing outside history, ordering and reviewing medications, tests or procedures, care coordination (communications with other health care professionals or caregivers) and documentation in the medical record.

## 2022-07-31 ENCOUNTER — Telehealth: Payer: Self-pay

## 2022-07-31 MED ORDER — TAMOXIFEN CITRATE 20 MG PO TABS: 20.0000 mg | ORAL_TABLET | Freq: Every day | ORAL | 3 refills | Status: DC

## 2022-07-31 NOTE — Telephone Encounter (Signed)
Pt called for refill on Tamoxifen. She verbalized confusion about which AI she should be taking. Pt reports she has been taking Anastrozole despite MD changing therapy to Tamoxifen. Pt thought she needed dental clearance to proceed with Tamoxifen. Educated pt on use of Tamoxifen and how it can help her osteoporosis as opposed to Anastrozole which can worsen it. She verbalized understanding. Medication sent per MD.

## 2022-08-06 ENCOUNTER — Ambulatory Visit
Admission: RE | Admit: 2022-08-06 | Discharge: 2022-08-06 | Disposition: A | Discharge status: Home / Self Care | Payer: Medicare Other | Source: Ambulatory Visit | Attending: Hematology and Oncology | Admitting: Hematology and Oncology

## 2022-08-06 DIAGNOSIS — C50912 Malignant neoplasm of unspecified site of left female breast: Secondary | ICD-10-CM

## 2022-11-01 ENCOUNTER — Encounter: Payer: Self-pay | Admitting: *Deleted

## 2022-12-10 ENCOUNTER — Other Ambulatory Visit: Payer: Self-pay | Admitting: *Deleted

## 2022-12-10 MED ORDER — POTASSIUM CHLORIDE ER 10 MEQ PO CPCR: 10.0000 meq | ORAL_CAPSULE | Freq: Two times a day (BID) | ORAL | 2 refills | Status: DC

## 2022-12-10 MED ORDER — TAMOXIFEN CITRATE 20 MG PO TABS: 20.0000 mg | ORAL_TABLET | Freq: Every day | ORAL | 3 refills | Status: DC

## 2022-12-11 ENCOUNTER — Telehealth: Payer: Self-pay | Admitting: Hematology and Oncology

## 2022-12-11 NOTE — Telephone Encounter (Signed)
Spoke with daughter and son confirming mothers upcoming appointment

## 2022-12-21 ENCOUNTER — Other Ambulatory Visit: Payer: Self-pay | Admitting: *Deleted

## 2022-12-21 DIAGNOSIS — C50212 Malignant neoplasm of upper-inner quadrant of left female breast: Secondary | ICD-10-CM

## 2022-12-24 ENCOUNTER — Other Ambulatory Visit: Payer: Self-pay

## 2022-12-24 ENCOUNTER — Inpatient Hospital Stay: Payer: Medicare Other | Attending: Hematology and Oncology

## 2022-12-24 DIAGNOSIS — Z171 Estrogen receptor negative status [ER-]: Secondary | ICD-10-CM | POA: Insufficient documentation

## 2022-12-24 DIAGNOSIS — C50212 Malignant neoplasm of upper-inner quadrant of left female breast: Secondary | ICD-10-CM | POA: Insufficient documentation

## 2022-12-24 DIAGNOSIS — Z95828 Presence of other vascular implants and grafts: Secondary | ICD-10-CM

## 2022-12-24 MED ORDER — HEPARIN SOD (PORK) LOCK FLUSH 100 UNIT/ML IV SOLN
500.0000 [IU] | Freq: Once | INTRAVENOUS | Status: AC | PRN
  Administered 2022-12-24: 500 [IU]

## 2022-12-24 MED ORDER — SODIUM CHLORIDE 0.9% FLUSH
10.0000 mL | INTRAVENOUS | Status: DC | PRN
  Administered 2022-12-24: 10 mL

## 2022-12-24 NOTE — Progress Notes (Signed)
Patient refused to have lab work done with port flush today. Dr. Al Pimple notified.

## 2023-02-25 ENCOUNTER — Inpatient Hospital Stay: Payer: Medicare Other | Attending: Hematology and Oncology

## 2023-03-07 ENCOUNTER — Other Ambulatory Visit: Payer: Self-pay | Admitting: Hematology and Oncology

## 2023-04-17 ENCOUNTER — Telehealth: Payer: Self-pay | Admitting: Hematology and Oncology

## 2023-04-17 NOTE — Telephone Encounter (Signed)
Left patient a message in regards to rescheduled appointment times/dates for follow up

## 2023-04-29 ENCOUNTER — Inpatient Hospital Stay: Payer: Medicare Other | Attending: Hematology and Oncology

## 2023-05-17 ENCOUNTER — Telehealth: Payer: Self-pay | Admitting: *Deleted

## 2023-05-17 NOTE — Telephone Encounter (Signed)
Spoke with patients daughter requesting appt change to 0830.  Daughter agreeable.

## 2023-05-23 ENCOUNTER — Other Ambulatory Visit: Payer: Medicare Other

## 2023-05-23 ENCOUNTER — Ambulatory Visit: Payer: Medicare Other | Admitting: Hematology and Oncology

## 2023-05-24 ENCOUNTER — Other Ambulatory Visit: Payer: Self-pay | Admitting: *Deleted

## 2023-05-24 DIAGNOSIS — C50212 Malignant neoplasm of upper-inner quadrant of left female breast: Secondary | ICD-10-CM

## 2023-05-27 ENCOUNTER — Inpatient Hospital Stay: Payer: Medicare Other | Attending: Hematology and Oncology

## 2023-05-27 ENCOUNTER — Telehealth: Payer: Self-pay | Admitting: *Deleted

## 2023-05-27 ENCOUNTER — Inpatient Hospital Stay (HOSPITAL_BASED_OUTPATIENT_CLINIC_OR_DEPARTMENT_OTHER): Payer: Medicare Other | Admitting: Hematology and Oncology

## 2023-05-27 VITALS — BP 142/83 | HR 89 | Temp 97.3°F | Resp 16 | Wt 156.6 lb

## 2023-05-27 DIAGNOSIS — M810 Age-related osteoporosis without current pathological fracture: Secondary | ICD-10-CM | POA: Insufficient documentation

## 2023-05-27 DIAGNOSIS — C50212 Malignant neoplasm of upper-inner quadrant of left female breast: Secondary | ICD-10-CM | POA: Diagnosis present

## 2023-05-27 DIAGNOSIS — Z923 Personal history of irradiation: Secondary | ICD-10-CM | POA: Insufficient documentation

## 2023-05-27 DIAGNOSIS — Z9012 Acquired absence of left breast and nipple: Secondary | ICD-10-CM | POA: Diagnosis not present

## 2023-05-27 DIAGNOSIS — Z171 Estrogen receptor negative status [ER-]: Secondary | ICD-10-CM | POA: Diagnosis not present

## 2023-05-27 DIAGNOSIS — Z1721 Progesterone receptor positive status: Secondary | ICD-10-CM | POA: Diagnosis not present

## 2023-05-27 DIAGNOSIS — Z95828 Presence of other vascular implants and grafts: Secondary | ICD-10-CM

## 2023-05-27 DIAGNOSIS — Z9221 Personal history of antineoplastic chemotherapy: Secondary | ICD-10-CM | POA: Insufficient documentation

## 2023-05-27 DIAGNOSIS — E559 Vitamin D deficiency, unspecified: Secondary | ICD-10-CM | POA: Insufficient documentation

## 2023-05-27 DIAGNOSIS — Z79810 Long term (current) use of selective estrogen receptor modulators (SERMs): Secondary | ICD-10-CM | POA: Insufficient documentation

## 2023-05-27 LAB — CBC WITH DIFFERENTIAL (CANCER CENTER ONLY)
Abs Immature Granulocytes: 0.01 10*3/uL (ref 0.00–0.07)
Basophils Absolute: 0 10*3/uL (ref 0.0–0.1)
Basophils Relative: 0 %
Eosinophils Absolute: 0 10*3/uL (ref 0.0–0.5)
Eosinophils Relative: 0 %
HCT: 44.6 % (ref 36.0–46.0)
Hemoglobin: 15.1 g/dL — ABNORMAL HIGH (ref 12.0–15.0)
Immature Granulocytes: 0 %
Lymphocytes Relative: 42 %
Lymphs Abs: 1.7 10*3/uL (ref 0.7–4.0)
MCH: 29.5 pg (ref 26.0–34.0)
MCHC: 33.9 g/dL (ref 30.0–36.0)
MCV: 87.1 fL (ref 80.0–100.0)
Monocytes Absolute: 0.3 10*3/uL (ref 0.1–1.0)
Monocytes Relative: 8 %
Neutro Abs: 2.1 10*3/uL (ref 1.7–7.7)
Neutrophils Relative %: 50 %
Platelet Count: 242 10*3/uL (ref 150–400)
RBC: 5.12 MIL/uL — ABNORMAL HIGH (ref 3.87–5.11)
RDW: 13.3 % (ref 11.5–15.5)
WBC Count: 4.1 10*3/uL (ref 4.0–10.5)
nRBC: 0 % (ref 0.0–0.2)

## 2023-05-27 LAB — CMP (CANCER CENTER ONLY)
ALT: 9 U/L (ref 0–44)
AST: 13 U/L — ABNORMAL LOW (ref 15–41)
Albumin: 3.9 g/dL (ref 3.5–5.0)
Alkaline Phosphatase: 50 U/L (ref 38–126)
Anion gap: 7 (ref 5–15)
BUN: 14 mg/dL (ref 8–23)
CO2: 27 mmol/L (ref 22–32)
Calcium: 10.3 mg/dL (ref 8.9–10.3)
Chloride: 102 mmol/L (ref 98–111)
Creatinine: 0.8 mg/dL (ref 0.44–1.00)
GFR, Estimated: >60 mL/min (ref >60)
Glucose, Bld: 138 mg/dL — ABNORMAL HIGH (ref 70–99)
Potassium: 2.9 mmol/L — ABNORMAL LOW (ref 3.5–5.1)
Sodium: 136 mmol/L (ref 135–145)
Total Bilirubin: 0.4 mg/dL (ref <1.2)
Total Protein: 7.2 g/dL (ref 6.5–8.1)

## 2023-05-27 MED ORDER — SODIUM CHLORIDE 0.9% FLUSH
10.0000 mL | INTRAVENOUS | Status: DC | PRN
Start: 2023-05-27 — End: 2023-05-27
  Administered 2023-05-27: 10 mL

## 2023-05-27 MED ORDER — HEPARIN SOD (PORK) LOCK FLUSH 100 UNIT/ML IV SOLN
500.0000 [IU] | Freq: Once | INTRAVENOUS | Status: AC | PRN
  Administered 2023-05-27: 500 [IU]

## 2023-05-27 NOTE — Progress Notes (Signed)
[facility]  [phone] [phone]     ID: Donna [name] DOB: 07-02-1945  MR#: [id number]  QQV#:[id number]  Patient Care Team: Erskine Speed, PA-C as PCP - General (Hematology and Oncology) Griselda Miner, MD as Consulting Physician (General Surgery) Durene Romans, MD as Consulting Physician (Orthopedic Surgery) Pershing Proud, RN as Oncology Nurse Navigator Donnelly Angelica, RN as Oncology Nurse Navigator Rachel Moulds, MD as Consulting Physician (Hematology and Oncology) Axel Filler, Larna Daughters, NP as Nurse Practitioner (Hematology and Oncology) Maryclare Labrador, RN as Registered Nurse Lonie Peak, MD as Attending Physician (Radiation Oncology) Rachel Moulds, MD OTHER MD: Daymon Larsen MD ([place]); Kellie Shropshire NP  CHIEF COMPLAINT: 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 She has been adhering to the medication regimen and is due for a mammogram in February. The patient also has osteoporosis, but treatment has been delayed due to pending dental insurance. The patient is also on blood pressure medication, which is reportedly well-managed. The patient reports a good appetite and engages in light physical activity around the house. The patient also mentions a need for Vitamin D supplements, which she plans to start taking.   COVID 19 VACCINATION STATUS: s/p Moderna x2   HISTORY OF CURRENT ILLNESS: From the original intake note:  [name] herself palpated a mass in her left breast during showering.  She brought this to medical attention and on 12/16/2019 underwent mammography, the results of which I do not have today.  She proceeded to left breast ultrasound showing breast density B.  There was a mass in the upper inner quadrant of the left  breast at the 10 o'clock position 7 cm from the nipple.  It measured 3.26 cm.  There are no ultrasound comments regarding the axilla.  Biopsy of the mass in question was performed 01/15/2020 at [facility] in [facility] and the pathology report describes a biphasic tumor with malignant epithelial component and a mesenchymal component.  The epithelial component appears to squamoid and stain strongly for CK 8/18 by immunohistochemistry.  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  The tumor was essentially triple negative, estrogen receptor 0%, progesterone receptor 1% with 1+ intensity, HER-2 1+ by immunohistochemistry, Ki-67 12%.  (The 1% progesterone receptor positivity is best read as an artifact as the progesterone receptor does not function in the absence of a functional estrogen receptor).  The patient's subsequent history is as detailed below   PAST MEDICAL HISTORY: Past Medical History:  Diagnosis Date   Arthritis    oa   History of kidney stones    Hypertension    Personal history of chemotherapy    Personal history of radiation therapy     PAST SURGICAL HISTORY: Past Surgical History:  Procedure Laterality Date   BREAST BIOPSY     CYSTOSCOPY  yrs ago   IR IMAGING GUIDED PORT INSERTION  02/16/2020   MASTECTOMY     MASTECTOMY W/ SENTINEL NODE BIOPSY Left 08/29/2020   Procedure: LEFT MASTECTOMY WITH SENTINEL LYMPH NODE BIOPSY;  Surgeon: Griselda Miner, MD;  Location: [facility] OR;  Service: General;  Laterality: Left;  RNFA, PEC BLOCK   TOTAL KNEE ARTHROPLASTY Left 06/05/2016   Procedure: LEFT TOTAL KNEE ARTHROPLASTY;  Surgeon: Durene Romans, MD;  Location: [facility]  ORS;  Service: Orthopedics;  Laterality: Left;    FAMILY HISTORY No family history on file.  The patient has no information on her father.  Her mother died at the age of 68.  She had 1 sister and 2 brothers.  No one on that side of the family  is known to have had cancer.  The patient herself had 5/2 siblings, 2 sisters and 3 brothers, none with a history of cancer.   GYNECOLOGIC HISTORY:  No LMP recorded (lmp unknown). Patient is postmenopausal. Menarche: 77 years old Age at first live birth: 77 years old GX P 2 LMP mid 73s Contraceptive no HRT no  Hysterectomy?  No Salpingo-oophorectomy?  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  She is divorced and her former husband subsequently died.  She lives by herself with no pets.  Her son [name] 89 is a Publishing copy and travels a great deal as a Research scientist (medical).  Daughter [name] teaches middle school in [place].  The patient has 4 grandchildren.  She is a Control and instrumentation engineer.    ADVANCED DIRECTIVES: Not in place   HEALTH MAINTENANCE: Social History   Tobacco Use   Smoking status: Never   Smokeless tobacco: Never  Vaping Use   Vaping status: Never Used  Substance Use Topics   Alcohol use: No   Drug use: No     Colonoscopy: Never  PAP: Remote  Bone density: Remote   Allergies  Allergen Reactions   Benazepril Swelling    Patient had severe angioedema on 10/05/2015.    Current Outpatient Medications  Medication Sig Dispense Refill   amLODipine (NORVASC) 10 MG tablet Take 1 tablet (10 mg total) by mouth daily after lunch. 30 tablet 0   chlorthalidone (HYGROTON) 25 MG tablet Take 1 tablet (25 mg total) by mouth daily. 30 tablet 0   losartan (COZAAR) 50 MG tablet Take 1 tablet (50 mg total) by mouth daily. 30 tablet 0   Multiple Vitamins-Minerals (CENTRUM ADULTS) TABS Take 1 tablet by mouth daily.     potassium chloride (MICRO-K) 10 MEQ CR capsule TAKE 1 CAPSULE BY MOUTH 2 TIMES DAILY. 180 capsule 0   tamoxifen (NOLVADEX) 20 MG tablet Take 1 tablet (20 mg total) by mouth daily. 90 tablet 3   No current facility-administered medications for this visit.   Facility-Administered Medications Ordered in Other Visits   Medication Dose Route Frequency Provider Last Rate Last Admin   sodium chloride flush (NS) 0.9 % injection 10 mL  10 mL Intracatheter PRN Rachel Moulds, MD   10 mL at 05/27/23 1610    OBJECTIVE: African-American woman who appears stated age There were no vitals filed for this visit.      There is no height or weight on file to calculate BMI.   Wt Readings from Last 3 Encounters:  05/21/22 153 lb 9.6 oz (69.7 kg)  02/05/22 151 lb 6 oz (68.7 kg)  11/16/21 151 lb 8 oz (68.7 kg)      ECOG FS:1 - Symptomatic but completely ambulatory  Physical Exam Constitutional:      Appearance: Normal appearance.  Cardiovascular:     Rate and Rhythm: Normal rate and regular rhythm.     Pulses: Normal pulses.     Heart sounds: Normal heart sounds.  Pulmonary:     Effort: Pulmonary effort is normal.     Breath sounds: Normal breath sounds.  Chest:     Comments: Right  breast with no palpable masses.  No regional adenopathy.  Left breast status postmastectomy with no concerns for recurrence. Abdominal:     General: Abdomen is flat. Bowel sounds are normal.  Musculoskeletal:        General: No swelling or tenderness.     Cervical back: Normal range of motion and neck supple. No rigidity.  Lymphadenopathy:     Cervical: No cervical adenopathy.  Skin:    General: Skin is warm and dry.  Neurological:     General: No focal deficit present.     Mental Status: She is alert.      LAB RESULTS:  CMP     Component Value Date/Time   NA 137 05/21/2022 0813   K 2.9 (L) 05/21/2022 0813   CL 102 05/21/2022 0813   CO2 28 05/21/2022 0813   GLUCOSE 133 (H) 05/21/2022 0813   BUN 17 05/21/2022 0813   CREATININE 0.80 05/21/2022 0813   CALCIUM 11.5 (H) 05/21/2022 0813   PROT 7.6 05/21/2022 0813   ALBUMIN 4.0 05/21/2022 0813   AST 12 (L) 05/21/2022 0813   ALT 9 05/21/2022 0813   ALKPHOS 81 05/21/2022 0813   BILITOT 0.4 05/21/2022 0813   GFRNONAA >60 05/21/2022 0813   GFRAA >60 03/08/2020 0906     No results found for: "TOTALPROTELP", "ALBUMINELP", "A1GS", "A2GS", "BETS", "BETA2SER", "GAMS", "MSPIKE", "SPEI"  No results found for: "KPAFRELGTCHN", "LAMBDASER", "KAPLAMBRATIO"  Lab Results  Component Value Date   WBC 4.1 05/27/2023   NEUTROABS 2.1 05/27/2023   HGB 15.1 (H) 05/27/2023   HCT 44.6 05/27/2023   MCV 87.1 05/27/2023   PLT 242 05/27/2023      Chemistry      Component Value Date/Time   NA 137 05/21/2022 0813   K 2.9 (L) 05/21/2022 0813   CL 102 05/21/2022 0813   CO2 28 05/21/2022 0813   BUN 17 05/21/2022 0813   CREATININE 0.80 05/21/2022 0813      Component Value Date/Time   CALCIUM 11.5 (H) 05/21/2022 0813   ALKPHOS 81 05/21/2022 0813   AST 12 (L) 05/21/2022 0813   ALT 9 05/21/2022 0813   BILITOT 0.4 05/21/2022 0813       No results found for: "LABCA2"  No components found for: "HQIONG295"  No results for input(s): "INR" in the last 168 hours.  No results found for: "LABCA2"  No results found for: "MWU132"  No results found for: "CAN125"  No results found for: "CAN153"  No results found for: "CA2729"  No components found for: "HGQUANT"  No results found for: "CEA1", "CEA" / No results found for: "CEA1", "CEA"   No results found for: "AFPTUMOR"  No results found for: "CHROMOGRNA"  No results found for: "HGBA", "HGBA2QUANT", "HGBFQUANT", "HGBSQUAN" (Hemoglobinopathy evaluation)   No results found for: "LDH"  No results found for: "IRON", "TIBC", "IRONPCTSAT" (Iron and TIBC)  No results found for: "FERRITIN"  Urinalysis    Component Value Date/Time   COLORURINE YELLOW 05/24/2020 0950   APPEARANCEUR HAZY (A) 05/24/2020 0950   LABSPEC 1.015 05/24/2020 0950   PHURINE 6.0 05/24/2020 0950   GLUCOSEU NEGATIVE 05/24/2020 0950   HGBUR NEGATIVE 05/24/2020 0950   BILIRUBINUR NEGATIVE 05/24/2020 0950   KETONESUR NEGATIVE 05/24/2020 0950   PROTEINUR 100 (A) 05/24/2020 0950   NITRITE NEGATIVE 05/24/2020 0950   LEUKOCYTESUR SMALL (A)  05/24/2020 0950    STUDIES: No results found.   ELIGIBLE FOR AVAILABLE RESEARCH PROTOCOL: no  ASSESSMENT: 77 y.o. [facility] [place] woman status  post left breast upper inner quadrant biopsy 01/15/2020 for a clinical T2 NX, stage IIB anaplastic carcinoma, triple negative, with an MIB-1 of 12%  (a) bone scan and CT chest 02/12/2020 show no evidence of metastatic disease  (1) neoadjuvant chemotherapy with doxorubicin and cyclophosphamide in dose dense fashion x4 started 02/23/2020, followed by weekly paclitaxel and carboplatin x12 starting 04/19/2020  (a) echo 02/12/2020 shows an ejection fraction in the 60-65% range  (2) left mastectomy and sentinel lymph node sampling 08/29/2020 showed a residual ypT2 ypN0 metaplastic carcinoma, grade 3, with negative margins.  (a) a total of 2 left axillary lymph nodes were removed  (b) repeat prognostic panel shows the tumor to be estrogen receptor negative but progesterone receptor positive at 5% with strong staining intensity.  HER2 was equivocal by immunotherapy but negative by FISH  (3) adjuvant radiation 12/01/2020 - 01/13/2021  (4) started anastrozole 02/14/2021, switched to Tamoxifen for PR positive at 5%   PLAN:  Breast Cancer On Tamoxifen since 2022 with no reported side effects. Mammogram due in February 2025. -Continue Tamoxifen. -No concerns for toxicity with tamoxifen. -NO concerns on mammogram. -Schedule mammogram for February 2025.  Osteoporosis Discussed the need for dental clearance prior to starting osteoporosis medication due to potential dental complications. Patient is awaiting dental insurance to kick in. -Obtain dental clearance for osteoporosis medication. -Consider reaching out to primary care provider to discuss potential for prescribing oral osteoporosis medication.  Vitamin D Deficiency Patient stopped taking Vitamin D supplement due to concerns about calcium. -Start Vitamin D supplement at 2000 IU  daily.  Follow-up in one year.   Rachel Moulds, MD   05/27/2023 8:56 AM Medical Oncology and Hematology [facility] [address] [place], [place] [address] Tel. [phone]    Fax. [phone]  Total time spent: 30 minutes  *Total Encounter Time as defined by the Centers for Medicare and Medicaid Services includes, in addition to the face-to-face time of a patient visit (documented in the note above) non-face-to-face time: obtaining and reviewing outside history, ordering and reviewing medications, tests or procedures, care coordination (communications with other health care professionals or caregivers) and documentation in the medical record.

## 2023-05-27 NOTE — Telephone Encounter (Signed)
See md note per need for follow up with primary MD regarding possible Reclast therapy

## 2023-06-03 ENCOUNTER — Telehealth: Payer: Self-pay | Admitting: *Deleted

## 2023-06-03 NOTE — Telephone Encounter (Addendum)
-----   Message from Carson Iruku sent at 05/29/2023  4:59 PM EST ----- Her potassium appears to be chronically low. Can we call and see if she is taking any potassium supplements, if not would be good idea to convey the message to the PCP about further management long term.  Thanks,  This RN called pt and spoke with her - she is taking potassium and for the next two days will double her dose ( total 40 mEq) then resume her usual dose,  This note also will be forwarded to her primary MD for further follow up for non cancer related concern.

## 2023-06-07 ENCOUNTER — Telehealth: Payer: Self-pay | Admitting: *Deleted

## 2023-06-07 NOTE — Telephone Encounter (Addendum)
-----   Message from Camden Iruku sent at 05/27/2023  9:10 AM EST ----- Val  Pt has change of PCP. She has osteoporosis on tamoxifen, can we pass on the message if they want to do reclast once dental clearance is done, since we dont have reclast. Pt prefers once a yr over every 6 months zometa  Thanks.  This RN forwarded above information to Dr Cain Saupe at [facility].

## 2023-12-03 ENCOUNTER — Other Ambulatory Visit: Payer: Self-pay | Admitting: *Deleted

## 2023-12-03 MED ORDER — TAMOXIFEN CITRATE 20 MG PO TABS: 20.0000 mg | ORAL_TABLET | Freq: Every day | ORAL | 3 refills | Status: DC

## 2023-12-05 ENCOUNTER — Other Ambulatory Visit: Payer: Self-pay | Admitting: Hematology and Oncology

## 2023-12-05 DIAGNOSIS — Z171 Estrogen receptor negative status [ER-]: Secondary | ICD-10-CM

## 2023-12-05 DIAGNOSIS — Z1231 Encounter for screening mammogram for malignant neoplasm of breast: Secondary | ICD-10-CM

## 2023-12-27 ENCOUNTER — Ambulatory Visit
Admission: RE | Admit: 2023-12-27 | Discharge: 2023-12-27 | Disposition: A | Discharge status: Home / Self Care | Source: Ambulatory Visit | Attending: Hematology and Oncology | Admitting: Hematology and Oncology

## 2023-12-27 DIAGNOSIS — C50212 Malignant neoplasm of upper-inner quadrant of left female breast: Secondary | ICD-10-CM

## 2023-12-27 DIAGNOSIS — Z1231 Encounter for screening mammogram for malignant neoplasm of breast: Secondary | ICD-10-CM

## 2024-01-07 ENCOUNTER — Other Ambulatory Visit (HOSPITAL_COMMUNITY): Payer: Self-pay

## 2024-05-05 ENCOUNTER — Telehealth: Payer: Self-pay | Admitting: Hematology and Oncology

## 2024-05-05 NOTE — Telephone Encounter (Signed)
 I spoke with patient's daughter to reschedule appointment with Dr. Loretha from 05/25/2024 to 06/16/2024. They are aware of date/time and would like appointment reminder mailed to daughter's address due to patient not having MyChart.

## 2024-05-28 ENCOUNTER — Ambulatory Visit: Payer: Medicare Other | Admitting: Hematology and Oncology

## 2024-06-01 ENCOUNTER — Ambulatory Visit: Payer: Medicare Other | Admitting: Hematology and Oncology

## 2024-06-10 ENCOUNTER — Telehealth: Payer: Self-pay | Admitting: Hematology and Oncology

## 2024-06-10 NOTE — Telephone Encounter (Signed)
 I SPOKE TO PATIENT'S DAUGHTER AND SHE IS AWARE OF RESCHEDULED LAB AND MD VISIT FROM 06/19/2024 TO 07/09/2024 PER HER REQUESTS.

## 2024-06-16 ENCOUNTER — Inpatient Hospital Stay: Admitting: Hematology and Oncology

## 2024-07-08 ENCOUNTER — Other Ambulatory Visit: Payer: Self-pay

## 2024-07-08 DIAGNOSIS — Z171 Estrogen receptor negative status [ER-]: Secondary | ICD-10-CM

## 2024-07-09 ENCOUNTER — Inpatient Hospital Stay

## 2024-07-09 ENCOUNTER — Inpatient Hospital Stay: Admitting: Hematology and Oncology

## 2024-07-09 VITALS — BP 155/82 | HR 89 | Temp 98.0°F | Resp 16 | Wt 159.4 lb

## 2024-07-09 DIAGNOSIS — Z1732 Human epidermal growth factor receptor 2 negative status: Secondary | ICD-10-CM | POA: Diagnosis not present

## 2024-07-09 DIAGNOSIS — Z79810 Long term (current) use of selective estrogen receptor modulators (SERMs): Secondary | ICD-10-CM | POA: Insufficient documentation

## 2024-07-09 DIAGNOSIS — M810 Age-related osteoporosis without current pathological fracture: Secondary | ICD-10-CM | POA: Insufficient documentation

## 2024-07-09 DIAGNOSIS — C50212 Malignant neoplasm of upper-inner quadrant of left female breast: Secondary | ICD-10-CM | POA: Insufficient documentation

## 2024-07-09 DIAGNOSIS — Z9221 Personal history of antineoplastic chemotherapy: Secondary | ICD-10-CM | POA: Diagnosis not present

## 2024-07-09 DIAGNOSIS — Z9012 Acquired absence of left breast and nipple: Secondary | ICD-10-CM | POA: Insufficient documentation

## 2024-07-09 DIAGNOSIS — Z1721 Progesterone receptor positive status: Secondary | ICD-10-CM | POA: Insufficient documentation

## 2024-07-09 DIAGNOSIS — Z171 Estrogen receptor negative status [ER-]: Secondary | ICD-10-CM | POA: Diagnosis not present

## 2024-07-09 DIAGNOSIS — Z923 Personal history of irradiation: Secondary | ICD-10-CM | POA: Insufficient documentation

## 2024-07-09 LAB — CMP (CANCER CENTER ONLY)
ALT: 10 U/L (ref 0–44)
AST: 17 U/L (ref 15–41)
Albumin: 4.2 g/dL (ref 3.5–5.0)
Alkaline Phosphatase: 61 U/L (ref 38–126)
Anion gap: 12 (ref 5–15)
BUN: 11 mg/dL (ref 8–23)
CO2: 24 mmol/L (ref 22–32)
Calcium: 11.1 mg/dL — ABNORMAL HIGH (ref 8.9–10.3)
Chloride: 102 mmol/L (ref 98–111)
Creatinine: 0.88 mg/dL (ref 0.44–1.00)
GFR, Estimated: >60 mL/min
Glucose, Bld: 153 mg/dL — ABNORMAL HIGH (ref 70–99)
Potassium: 3.1 mmol/L — ABNORMAL LOW (ref 3.5–5.1)
Sodium: 138 mmol/L (ref 135–145)
Total Bilirubin: 0.3 mg/dL (ref 0.0–1.2)
Total Protein: 7.6 g/dL (ref 6.5–8.1)

## 2024-07-09 LAB — CBC WITH DIFFERENTIAL (CANCER CENTER ONLY)
Abs Immature Granulocytes: 0.01 10*3/uL (ref 0.00–0.07)
Basophils Absolute: 0 10*3/uL (ref 0.0–0.1)
Basophils Relative: 0 %
Eosinophils Absolute: 0 10*3/uL (ref 0.0–0.5)
Eosinophils Relative: 0 %
HCT: 43.6 % (ref 36.0–46.0)
Hemoglobin: 14.9 g/dL (ref 12.0–15.0)
Immature Granulocytes: 0 %
Lymphocytes Relative: 34 %
Lymphs Abs: 1.6 10*3/uL (ref 0.7–4.0)
MCH: 29.8 pg (ref 26.0–34.0)
MCHC: 34.2 g/dL (ref 30.0–36.0)
MCV: 87.2 fL (ref 80.0–100.0)
Monocytes Absolute: 0.3 10*3/uL (ref 0.1–1.0)
Monocytes Relative: 7 %
Neutro Abs: 2.8 10*3/uL (ref 1.7–7.7)
Neutrophils Relative %: 59 %
Platelet Count: 236 10*3/uL (ref 150–400)
RBC: 5 MIL/uL (ref 3.87–5.11)
RDW: 13.2 % (ref 11.5–15.5)
WBC Count: 4.7 10*3/uL (ref 4.0–10.5)
nRBC: 0 % (ref 0.0–0.2)

## 2024-07-09 MED ORDER — TAMOXIFEN CITRATE 20 MG PO TABS: 20.0000 mg | ORAL_TABLET | Freq: Every day | ORAL | 3 refills | Status: AC

## 2024-07-09 NOTE — Progress Notes (Signed)
 " [facility]  [phone] Fax:([phone]     ID: Donna [name] DOB: [birth year]  MR#: [id number]  RDW#:[id number]  Patient Care Team: Alec House, MD as PCP - General (Family Medicine) Curvin Deward MOULD, MD as Consulting Physician (General Surgery) Ernie Cough, MD as Consulting Physician (Orthopedic Surgery) Tyree Nanetta SAILOR, RN as Oncology Nurse Navigator Loretha Ash, MD as Consulting Physician (Hematology and Oncology) Crawford, Morna Pickle, NP as Nurse Practitioner (Hematology and Oncology) Starla Wendelyn BIRCH, RN as Registered Nurse Izell Domino, MD as Attending Physician (Radiation Oncology) Ash Loretha, MD OTHER MD: Tanda Falter MD ([place]); Ronal Guan NP  CHIEF COMPLAINT: 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  History of Present Illness Donna [name] is a 79 year old female with metaplastic carcinoma of the left breast, status post neoadjuvant chemotherapy, mastectomy, adjuvant radiation, and ongoing tamoxifen  therapy, who presents for routine oncology follow-up and surveillance.  She is three and a half years into adjuvant endocrine therapy, currently taking tamoxifen  20 mg daily, following a prior course of anastrozole  initiated in 2022. She reports no joint stiffness or vasomotor symptoms while taking tamoxifen . She expresses concern regarding cancer recurrence but is reassured by a recent normal mammogram. She remains asymptomatic, with no new breast symptoms, abnormal bleeding, or lower extremity edema. She continues annual mammographic surveillance, with the most recent study reported as normal.  She has osteoporosis with a prior T-score of -3.4 and is taking vitamin D supplementation. She recalls prior discussion regarding the  need for dental clearance before potential infusion therapy for osteoporosis, but has not yet received any infusion. She has not had a bone density scan in at least two years, with the last performed at the breast center. She has been advised previously about the potential positive effect of tamoxifen  on bone density.  She denies any new health issues since her last visit and reports feeling well overall, with no limitations in daily activities. All medications are current, and there have been no changes to her regimen. During the visit, she inquired about a recent change in her tamoxifen  prescription strength.  May 27, 2023: Follow-up for progesterone receptor positive breast cancer, status post left mastectomy, ongoing Tamoxifen  therapy since 2022 with no side effects. Hypertension well controlled, osteoporosis treatment pending dental clearance, and plan to restart Vitamin D supplementation. Mammogram scheduled for February 2025; overall stable clinical status.    COVID 19 VACCINATION STATUS: s/p Moderna x2   HISTORY OF CURRENT ILLNESS: From the original intake note:  Donna [name] herself palpated a mass in her left breast during showering.  She brought this to medical attention and on 12/16/2019 underwent mammography, the results of which I do not have today.  She proceeded to left breast ultrasound showing breast density B.  There was a mass in the upper inner quadrant of the left breast at the 10 o'clock position 7 cm from the nipple.  It measured 3.26 cm.  There are no ultrasound comments regarding the axilla.  Biopsy of the mass in question was performed 01/15/2020 at [facility] in [facility] and the pathology report describes a biphasic tumor with malignant epithelial component and a mesenchymal component.  The epithelial component appears to squamoid and stain strongly for CK 8/18 by immunohistochemistry.  The mesenchymal component has a mix of a chondroid appearance and the final  diagnosis was  metaplastic carcinoma (carcinosarcoma).  The tumor was essentially triple negative, estrogen receptor 0%, progesterone receptor 1% with 1+ intensity, HER-2 1+ by immunohistochemistry, Ki-67 12%.  (The 1% progesterone receptor positivity is best read as an artifact as the progesterone receptor does not function in the absence of a functional estrogen receptor).  The patient's subsequent history is as detailed below   PAST MEDICAL HISTORY: Past Medical History:  Diagnosis Date   Arthritis    oa   History of kidney stones    Hypertension    Personal history of chemotherapy    Personal history of radiation therapy     PAST SURGICAL HISTORY: Past Surgical History:  Procedure Laterality Date   BREAST BIOPSY     CYSTOSCOPY  yrs ago   IR IMAGING GUIDED PORT INSERTION  02/16/2020   MASTECTOMY     MASTECTOMY W/ SENTINEL NODE BIOPSY Left 08/29/2020   Procedure: LEFT MASTECTOMY WITH SENTINEL LYMPH NODE BIOPSY;  Surgeon: Curvin Deward MOULD, MD;  Location: [facility] OR;  Service: General;  Laterality: Left;  RNFA, PEC BLOCK   TOTAL KNEE ARTHROPLASTY Left 06/05/2016   Procedure: LEFT TOTAL KNEE ARTHROPLASTY;  Surgeon: Donnice Car, MD;  Location: [facility] ORS;  Service: Orthopedics;  Laterality: Left;    FAMILY HISTORY No family history on file.  The patient has no information on her father.  Her mother died at the age of 95.  She had 1 sister and 2 brothers.  No one on that side of the family is known to have had cancer.  The patient herself had 5/2 siblings, 2 sisters and 3 brothers, none with a history of cancer.   GYNECOLOGIC HISTORY:  No LMP recorded (lmp unknown). Patient is postmenopausal. Menarche: 78 years old Age at first live birth: 79 years old GX P 2 LMP mid 13s Contraceptive no HRT no  Hysterectomy?  No Salpingo-oophorectomy?  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  She is divorced and her former husband subsequently died.  She lives  by herself with no pets.  Her son Prentice 49 is a publishing copy and travels a great deal as a research scientist (medical).  Daughter Desmond teaches middle school in [place].  The patient has 4 grandchildren.  She is a Control And Instrumentation Engineer.    ADVANCED DIRECTIVES: Not in place   HEALTH MAINTENANCE: Social History   Tobacco Use   Smoking status: Never   Smokeless tobacco: Never  Vaping Use   Vaping status: Never Used  Substance Use Topics   Alcohol use: No   Drug use: No     Colonoscopy: Never  PAP: Remote  Bone density: Remote   Allergies  Allergen Reactions   Benazepril Swelling    Patient had severe angioedema on 10/05/2015.    Current Outpatient Medications  Medication Sig Dispense Refill   amLODipine  (NORVASC ) 10 MG tablet Take 1 tablet (10 mg total) by mouth daily after lunch. 30 tablet 0   chlorthalidone  (HYGROTON ) 25 MG tablet Take 1 tablet (25 mg total) by mouth daily. 30 tablet 0   losartan  (COZAAR ) 50 MG tablet Take 1 tablet (50 mg total) by mouth daily. 30 tablet 0   Multiple Vitamins-Minerals (CENTRUM ADULTS) TABS Take 1 tablet by mouth daily.     potassium chloride  (MICRO-K ) 10 MEQ CR capsule TAKE 1 CAPSULE BY MOUTH 2 TIMES DAILY. 180 capsule 0   tamoxifen  (NOLVADEX ) 20 MG tablet Take 1 tablet (20 mg total)  by mouth daily. 90 tablet 3   No current facility-administered medications for this visit.    OBJECTIVE: African-American woman who appears stated age Vitals:   07/09/24 0859  BP: (!) 155/82  Pulse: 89  Resp: 16  Temp: 98 F (36.7 C)  SpO2: 100%        Body mass index is 26.53 kg/m.   Wt Readings from Last 3 Encounters:  07/09/24 159 lb 6.4 oz (72.3 kg)  05/27/23 156 lb 9.6 oz (71 kg)  05/21/22 153 lb 9.6 oz (69.7 kg)      ECOG FS:1 - Symptomatic but completely ambulatory  Physical Exam Constitutional:      Appearance: Normal appearance.  Cardiovascular:     Rate and Rhythm: Normal rate and regular rhythm.     Pulses: Normal pulses.     Heart sounds:  Normal heart sounds.  Pulmonary:     Effort: Pulmonary effort is normal.     Breath sounds: Normal breath sounds.  Chest:     Comments: Right breast with no palpable masses.  No regional adenopathy.  Left breast status postmastectomy with no concerns for recurrence. Abdominal:     General: Abdomen is flat. Bowel sounds are normal.  Musculoskeletal:        General: No swelling or tenderness.     Cervical back: Normal range of motion and neck supple. No rigidity.  Lymphadenopathy:     Cervical: No cervical adenopathy.  Skin:    General: Skin is warm and dry.  Neurological:     General: No focal deficit present.     Mental Status: She is alert.      LAB RESULTS:  CMP     Component Value Date/Time   NA 138 07/09/2024 0803   K 3.1 (L) 07/09/2024 0803   CL 102 07/09/2024 0803   CO2 24 07/09/2024 0803   GLUCOSE 153 (H) 07/09/2024 0803   BUN 11 07/09/2024 0803   CREATININE 0.88 07/09/2024 0803   CALCIUM 11.1 (H) 07/09/2024 0803   PROT 7.6 07/09/2024 0803   ALBUMIN 4.2 07/09/2024 0803   AST 17 07/09/2024 0803   ALT 10 07/09/2024 0803   ALKPHOS 61 07/09/2024 0803   BILITOT 0.3 07/09/2024 0803   GFRNONAA >60 07/09/2024 0803   GFRAA >60 03/08/2020 0906    No results found for: STEPHANY RINGS, A1GS, A2GS, BETS, BETA2SER, GAMS, MSPIKE, SPEI  No results found for: JONATHAN BONG, [facility]  Lab Results  Component Value Date   WBC 4.7 07/09/2024   NEUTROABS 2.8 07/09/2024   HGB 14.9 07/09/2024   HCT 43.6 07/09/2024   MCV 87.2 07/09/2024   PLT 236 07/09/2024      Chemistry      Component Value Date/Time   NA 138 07/09/2024 0803   K 3.1 (L) 07/09/2024 0803   CL 102 07/09/2024 0803   CO2 24 07/09/2024 0803   BUN 11 07/09/2024 0803   CREATININE 0.88 07/09/2024 0803      Component Value Date/Time   CALCIUM 11.1 (H) 07/09/2024 0803   ALKPHOS 61 07/09/2024 0803   AST 17 07/09/2024 0803   ALT 10 07/09/2024 0803   BILITOT  0.3 07/09/2024 0803       No results found for: LABCA2  No components found for: [id number]  No results for input(s): INR in the last 168 hours.  No results found for: LABCA2  No results found for: CAN199  No results found for: CAN125  No results found for: CAN153  No  results found for: CA2729  No components found for: HGQUANT  No results found for: CEA1, CEA / No results found for: CEA1, CEA   No results found for: AFPTUMOR  No results found for: CHROMOGRNA  No results found for: HGBA, HGBA2QUANT, HGBFQUANT, HGBSQUAN (Hemoglobinopathy evaluation)   No results found for: LDH  No results found for: IRON, TIBC, IRONPCTSAT (Iron and TIBC)  No results found for: FERRITIN  Urinalysis    Component Value Date/Time   COLORURINE YELLOW 05/24/2020 0950   APPEARANCEUR HAZY (A) 05/24/2020 0950   LABSPEC 1.015 05/24/2020 0950   PHURINE 6.0 05/24/2020 0950   GLUCOSEU NEGATIVE 05/24/2020 0950   HGBUR NEGATIVE 05/24/2020 0950   BILIRUBINUR NEGATIVE 05/24/2020 0950   KETONESUR NEGATIVE 05/24/2020 0950   PROTEINUR 100 (A) 05/24/2020 0950   NITRITE NEGATIVE 05/24/2020 0950   LEUKOCYTESUR SMALL (A) 05/24/2020 0950    STUDIES: No results found.   ELIGIBLE FOR AVAILABLE RESEARCH PROTOCOL: no  ASSESSMENT: 79 y.o. [place]  woman status post left breast upper inner quadrant biopsy 01/15/2020 for a clinical T2 NX, stage IIB anaplastic carcinoma, triple negative, with an MIB-1 of 12%  (a) bone scan and CT chest 02/12/2020 show no evidence of metastatic disease  (1) neoadjuvant chemotherapy with doxorubicin  and cyclophosphamide  in dose dense fashion x4 started 02/23/2020, followed by weekly paclitaxel  and carboplatin  x12 starting 04/19/2020  (a) echo 02/12/2020 shows an ejection fraction in the 60-65% range  (2) left mastectomy and sentinel lymph node sampling 08/29/2020 showed a residual ypT2 ypN0 metaplastic  carcinoma, grade 3, with negative margins.  (a) a total of 2 left axillary lymph nodes were removed  (b) repeat prognostic panel shows the tumor to be estrogen receptor negative but progesterone receptor positive at 5% with strong staining intensity.  HER2 was equivocal by immunotherapy but negative by FISH  (3) adjuvant radiation 12/01/2020 - 01/13/2021  (4) started anastrozole  02/14/2021, switched to Tamoxifen  for PR positive at 5%   PLAN:  Assessment and Plan Assessment & Plan Progesterone receptor positive breast cancer of the left breast, upper-inner quadrant Recent mammogram negative. - Continued tamoxifen  20 mg daily; advised dose reduction to 10 mg or temporary discontinuation if joint stiffness or vasomotor symptoms occur. - Ordered annual mammogram (next due July 2025). - Continued annual oncology follow-up and clinical breast exams at each visit. - Scheduled follow-up in one year.  Osteoporosis Osteoporosis with T-score -3.4 and increased risk for further bone loss due to age and prior cancer therapy. Tamoxifen  may confer bone-protective effects. - Ordered repeat DEXA scan. - Advised continuation of vitamin D supplementation. - Encouraged weight-bearing exercise. - Deferred initiation of osteoporosis medication pending DEXA results.   Amber Stalls, MD   07/09/2024 9:05 AM Medical Oncology and Hematology [facility] [address] [place], [place] [address] Tel. [phone]    Fax. [phone]  Total time spent: 30 minutes  *Total Encounter Time as defined by the Centers for Medicare and Medicaid Services includes, in addition to the face-to-face time of a patient visit (documented in the note above) non-face-to-face time: obtaining and reviewing outside history, ordering and reviewing medications, tests or procedures, care coordination (communications with other health care professionals or caregivers) and documentation in the medical record. "

## 2025-07-09 ENCOUNTER — Inpatient Hospital Stay

## 2025-07-09 ENCOUNTER — Inpatient Hospital Stay: Admitting: Hematology and Oncology
